# Patient Record
Sex: Male | Born: 1980 | Race: White | Hispanic: No | Marital: Single | Smoking: Current every day smoker
Health system: Southern US, Community
[De-identification: ages and names within clinical notes are randomized; demographics above are authoritative.]

## PROBLEM LIST (undated history)

## (undated) DIAGNOSIS — I10 Essential (primary) hypertension: Secondary | ICD-10-CM

## (undated) DIAGNOSIS — F32A Depression, unspecified: Secondary | ICD-10-CM

## (undated) DIAGNOSIS — M549 Dorsalgia, unspecified: Secondary | ICD-10-CM

## (undated) DIAGNOSIS — F259 Schizoaffective disorder, unspecified: Secondary | ICD-10-CM

## (undated) DIAGNOSIS — F329 Major depressive disorder, single episode, unspecified: Secondary | ICD-10-CM

## (undated) DIAGNOSIS — F419 Anxiety disorder, unspecified: Secondary | ICD-10-CM

## (undated) HISTORY — PX: BACK SURGERY: SHX140

---

## 2001-08-24 ENCOUNTER — Encounter: Payer: Self-pay | Admitting: Family Medicine

## 2001-08-24 ENCOUNTER — Ambulatory Visit (HOSPITAL_COMMUNITY): Admission: RE | Admit: 2001-08-24 | Discharge: 2001-08-24 | Payer: Self-pay | Admitting: Family Medicine

## 2002-11-08 ENCOUNTER — Encounter (HOSPITAL_COMMUNITY): Admission: RE | Admit: 2002-11-08 | Discharge: 2002-12-08 | Payer: Self-pay | Admitting: Preventative Medicine

## 2002-12-07 ENCOUNTER — Ambulatory Visit (HOSPITAL_COMMUNITY): Admission: RE | Admit: 2002-12-07 | Discharge: 2002-12-07 | Payer: Self-pay | Admitting: Family Medicine

## 2002-12-23 ENCOUNTER — Ambulatory Visit (HOSPITAL_COMMUNITY): Admission: RE | Admit: 2002-12-23 | Discharge: 2002-12-23 | Payer: Self-pay | Admitting: Family Medicine

## 2004-06-27 ENCOUNTER — Ambulatory Visit: Payer: Self-pay | Admitting: Orthopedic Surgery

## 2004-11-27 ENCOUNTER — Ambulatory Visit (HOSPITAL_COMMUNITY): Admission: RE | Admit: 2004-11-27 | Discharge: 2004-11-27 | Payer: Self-pay | Admitting: Pulmonary Disease

## 2006-02-14 ENCOUNTER — Encounter: Admission: RE | Admit: 2006-02-14 | Discharge: 2006-02-14 | Payer: Self-pay | Admitting: Orthopaedic Surgery

## 2011-01-14 ENCOUNTER — Other Ambulatory Visit: Payer: Self-pay

## 2011-01-14 ENCOUNTER — Emergency Department (HOSPITAL_COMMUNITY): Payer: BC Managed Care – PPO

## 2011-01-14 ENCOUNTER — Emergency Department (HOSPITAL_COMMUNITY)
Admission: EM | Admit: 2011-01-14 | Discharge: 2011-01-14 | Disposition: A | Discharge status: Home / Self Care | Payer: BC Managed Care – PPO | Attending: Emergency Medicine | Admitting: Emergency Medicine

## 2011-01-14 ENCOUNTER — Encounter: Payer: Self-pay | Admitting: *Deleted

## 2011-01-14 DIAGNOSIS — R0602 Shortness of breath: Secondary | ICD-10-CM | POA: Insufficient documentation

## 2011-01-14 DIAGNOSIS — I10 Essential (primary) hypertension: Secondary | ICD-10-CM | POA: Insufficient documentation

## 2011-01-14 DIAGNOSIS — I498 Other specified cardiac arrhythmias: Secondary | ICD-10-CM | POA: Insufficient documentation

## 2011-01-14 DIAGNOSIS — R079 Chest pain, unspecified: Secondary | ICD-10-CM

## 2011-01-14 DIAGNOSIS — F17200 Nicotine dependence, unspecified, uncomplicated: Secondary | ICD-10-CM | POA: Insufficient documentation

## 2011-01-14 HISTORY — DX: Major depressive disorder, single episode, unspecified: F32.9

## 2011-01-14 HISTORY — DX: Essential (primary) hypertension: I10

## 2011-01-14 HISTORY — DX: Anxiety disorder, unspecified: F41.9

## 2011-01-14 HISTORY — DX: Depression, unspecified: F32.A

## 2011-01-14 LAB — BASIC METABOLIC PANEL
BUN: 9 mg/dL (ref 6–23)
CO2: 28 mEq/L (ref 19–32)
Calcium: 9.6 mg/dL (ref 8.4–10.5)
Chloride: 104 mEq/L (ref 96–112)
Creatinine, Ser: 0.95 mg/dL (ref 0.50–1.35)
GFR calc Af Amer: >90 mL/min (ref >90)
GFR calc non Af Amer: >90 mL/min (ref >90)
Glucose, Bld: 97 mg/dL (ref 70–99)
Potassium: 4.3 mEq/L (ref 3.5–5.1)
Sodium: 141 mEq/L (ref 135–145)

## 2011-01-14 LAB — CBC
HCT: 45.9 % (ref 39.0–52.0)
Hemoglobin: 16.1 g/dL (ref 13.0–17.0)
MCH: 31.6 pg (ref 26.0–34.0)
MCHC: 35.1 g/dL (ref 30.0–36.0)
MCV: 90 fL (ref 78.0–100.0)
Platelets: 156 10*3/uL (ref 150–400)
RBC: 5.1 MIL/uL (ref 4.22–5.81)
RDW: 12.7 % (ref 11.5–15.5)
WBC: 12 10*3/uL — ABNORMAL HIGH (ref 4.0–10.5)

## 2011-01-14 LAB — D-DIMER, QUANTITATIVE: D-Dimer, Quant: 2.08 ug/mL-FEU — ABNORMAL HIGH (ref 0.00–0.48)

## 2011-01-14 LAB — CARDIAC PANEL(CRET KIN+CKTOT+MB+TROPI)
CK, MB: 1.7 ng/mL (ref 0.3–4.0)
Relative Index: 1.3 (ref 0.0–2.5)
Total CK: 127 U/L (ref 7–232)
Troponin I: <0.3 ng/mL (ref <0.30)

## 2011-01-14 LAB — LIPASE, BLOOD: Lipase: 32 U/L (ref 11–59)

## 2011-01-14 MED ORDER — ASPIRIN 81 MG PO CHEW
324.0000 mg | CHEWABLE_TABLET | Freq: Once | ORAL | Status: AC
  Administered 2011-01-14: 324 mg via ORAL
  Filled 2011-01-14: qty 4

## 2011-01-14 MED ORDER — IOHEXOL 350 MG/ML SOLN
100.0000 mL | Freq: Once | INTRAVENOUS | Status: AC | PRN
Start: 2011-01-14 — End: 2011-01-14
  Administered 2011-01-14: 100 mL via INTRAVENOUS

## 2011-01-14 NOTE — ED Notes (Signed)
Central cp that started appox 0500 this morning.  States woke pt up from sleep.  C/o sob and dizziness.

## 2011-01-14 NOTE — ED Notes (Signed)
PT reports was driving [place] and had sudden onset of chest pain/pressure in center of chest.  Reports lasted approx .  This morning pt says woke up around 0500 with severe pain/pressure in center of chest.   Pt denies pain or pressure at this time.  Denies nausea but says was a little dizzy this am.  Pt's bp elevated.  PT supposed to be on bp medication but hasn't taken it in months.  Pt says he just forgets to take it.

## 2011-01-14 NOTE — ED Notes (Signed)
nad noted at this time.

## 2011-01-15 NOTE — ED Provider Notes (Addendum)
History     CSN: [id number] Arrival date & time: 01/14/2011  8:48 AM   First MD Initiated Contact with Patient 01/14/11 0915      Chief Complaint  Patient presents with  . Chest Pain    (Consider location/radiation/quality/duration/timing/severity/associated sxs/prior treatment) Patient is a 30 y.o. male presenting with chest pain. The history is provided by the patient.  Chest Pain The chest pain began 3 - 5 hours ago. Chest pain occurs constantly. The chest pain is unchanged. The severity of the pain is moderate. The quality of the pain is described as dull. The pain does not radiate. Chest pain is worsened by deep breathing. Primary symptoms include dizziness. Pertinent negatives for primary symptoms include no shortness of breath, no cough, no wheezing, no palpitations and no abdominal pain.  Dizziness does not occur with weakness or diaphoresis.  Pertinent negatives for associated symptoms include no claudication, no diaphoresis, no lower extremity edema, no near-syncope, no numbness, no orthopnea and no weakness. He tried nothing for the symptoms. Risk factors include male gender and smoking/tobacco exposure (hypertension).  His past medical history is significant for hypertension.  Pertinent negatives for past medical history include no seizures.  His family medical history is significant for hypertension in family. Procedure history comments: none.     Past Medical History  Diagnosis Date  . Hypertension   . Depression   . Anxiety     History reviewed. No pertinent past surgical history.  No family history on file.  History  Substance Use Topics  . Smoking status: Current Everyday Smoker    Types: Cigarettes  . Smokeless tobacco: Not on file  . Alcohol Use: Yes     daily      Review of Systems  Constitutional: Negative for diaphoresis and activity change.       All ROS Neg except as noted in HPI  HENT: Negative for nosebleeds and neck pain.   Eyes: Negative  for photophobia and discharge.  Respiratory: Negative for cough, shortness of breath and wheezing.   Cardiovascular: Positive for chest pain. Negative for palpitations, orthopnea, claudication and near-syncope.  Gastrointestinal: Negative for abdominal pain and blood in stool.  Genitourinary: Negative for dysuria, frequency and hematuria.  Musculoskeletal: Negative for back pain and arthralgias.  Skin: Negative.   Neurological: Positive for dizziness. Negative for seizures, speech difficulty, weakness and numbness.  Psychiatric/Behavioral: Negative for hallucinations and confusion. The patient is nervous/anxious.        Depression    Allergies  Review of patient's allergies indicates no known allergies.  Home Medications   Current Outpatient Rx  Name Route Sig Dispense Refill  . LISINOPRIL-HYDROCHLOROTHIAZIDE 20-12.5 MG PO TABS Oral Take 1 tablet by mouth daily.        BP 150/103  Pulse 89  Temp(Src) 98 F (36.7 C) (Oral)  Resp 18  Ht 5\' 11"  (1.803 m)  Wt 200 lb (90.719 kg)  BMI 27.89 kg/m2  SpO2 98%  Physical Exam  Nursing note and vitals reviewed. Constitutional: He is oriented to person, place, and time. He appears well-developed and well-nourished.  Non-toxic appearance.  HENT:  Head: Normocephalic.  Right Ear: Tympanic membrane and external ear normal.  Left Ear: Tympanic membrane and external ear normal.  Eyes: EOM and lids are normal. Pupils are equal, round, and reactive to light.  Neck: Normal range of motion. Neck supple. No JVD present. Carotid bruit is not present.  Cardiovascular: Normal rate, regular rhythm, normal heart sounds, intact distal pulses and  normal pulses.   Pulmonary/Chest: Breath sounds normal. No respiratory distress. He has no wheezes. He has no rales. He exhibits no tenderness.  Abdominal: Soft. Bowel sounds are normal. He exhibits no mass. There is no tenderness. There is no guarding.  Musculoskeletal: Normal range of motion. He exhibits no  edema and no tenderness.       Neg Homan's Sign  Lymphadenopathy:       Head (right side): No submandibular adenopathy present.       Head (left side): No submandibular adenopathy present.    He has no cervical adenopathy.  Neurological: He is alert and oriented to person, place, and time. He has normal strength. No cranial nerve deficit or sensory deficit.  Skin: Skin is warm and dry. No rash noted. He is not diaphoretic.  Psychiatric: He has a normal mood and affect. His speech is normal.    ED Course  Procedures (including critical care time) EKG: EKG at 8:50am. Rhythm: Normal sinus with sinus arrhythmia. Left Atrial Enlargement. NO STEMI or Life threatening arrhythmia. Labs Reviewed  CBC - Abnormal; Notable for the following:    WBC 12.0 (*)    All other components within normal limits  D-DIMER, QUANTITATIVE - Abnormal; Notable for the following:    D-Dimer, Quant 2.08 (*)    All other components within normal limits  CARDIAC PANEL(CRET KIN+CKTOT+MB+TROPI)  BASIC METABOLIC PANEL  LIPASE, BLOOD  LAB REPORT - SCANNED   Ct Angio Chest W/cm &/or Wo Cm  01/14/2011  *RADIOLOGY REPORT*  Clinical Data:  Chest pain, elevated D-dimer  CT ANGIOGRAPHY CHEST WITH CONTRAST  Technique:  Multidetector CT imaging of the chest was performed using the standard protocol during bolus administration of intravenous contrast.  Multiplanar CT image reconstructions including MIPs were obtained to evaluate the vascular anatomy.  Contrast: OMNIPAQUE IOHEXOL 350 MG/ML IV SOLN  Comparison:  Chest x-rays same day  Findings:  Images of the thoracic inlet are unremarkable.  Central airways are patent.  No mediastinal hematoma or adenopathy.  The study is of excellent technical quality.  There is no pulmonary embolus.  Images of the lung parenchyma shows no acute infiltrate or pleural effusion.  No pulmonary edema.  There is a calcified granuloma within the liver anteriorly measures 5.5 mm.  No adrenal gland mass  is noted in visualized upper abdomen.  Sagittal images of the spine shows no destructive bony lesions. Sagittal view of the sternum is unremarkable.  Review of the MIP images confirms the above findings.  IMPRESSION:  1.  No pulmonary embolus is noted. 2.  No acute infiltrate or pulmonary edema. 3.  No adenopathy.  Original Report Authenticated By: Natasha Mead, M.D.   Dg Chest Portable 1 View  01/14/2011  *RADIOLOGY REPORT*  Clinical Data: Chest pain, shortness of breath  PORTABLE CHEST - 1 VIEW  Comparison: None.  Findings: Cardiomediastinal silhouette is unremarkable.  No acute infiltrate or pleural effusion.  No pulmonary edema.  Bony thorax is unremarkable.  IMPRESSION: No active disease.  Original Report Authenticated By: Natasha Mead, M.D.     1. Chest pain, unspecified       MDM  I have reviewed nursing notes, vital signs, and all appropriate lab and imaging results for this patient. Chest xray, CT angio, cardiac markers, and electrolytes all wnl. Pt monitored inED for several hours, no arhythmias noted on monitor. Safe for pt to be discharged home with out patient recheck.        Kathie Dike,  PA 01/15/11 [id number]  Kathie Dike, PA 03/01/11 1654  Kathie Dike, PA 03/24/11 [phone]

## 2011-01-17 NOTE — ED Provider Notes (Signed)
Medical screening examination/treatment/procedure(s) were performed by non-physician practitioner and as supervising physician I was immediately available for consultation/collaboration.  Nicoletta Dress. Colon Branch, MD 01/17/11 1040

## 2011-03-05 NOTE — ED Provider Notes (Signed)
Medical screening examination/treatment/procedure(s) were performed by non-physician practitioner and as supervising physician I was immediately available for consultation/collaboration.  Nicoletta Dress. Colon Branch, MD 03/05/11 2242

## 2011-03-24 NOTE — ED Provider Notes (Signed)
Medical screening examination/treatment/procedure(s) were performed by non-physician practitioner and as supervising physician I was immediately available for consultation/collaboration.  Nicoletta Dress. Colon Branch, MD 03/24/11 2322

## 2012-09-01 ENCOUNTER — Other Ambulatory Visit (HOSPITAL_COMMUNITY): Payer: Self-pay | Admitting: Family Medicine

## 2012-09-01 ENCOUNTER — Ambulatory Visit (HOSPITAL_COMMUNITY)
Admission: RE | Admit: 2012-09-01 | Discharge: 2012-09-01 | Disposition: A | Discharge status: Home / Self Care | Payer: BC Managed Care – PPO | Source: Ambulatory Visit | Attending: Family Medicine | Admitting: Family Medicine

## 2012-09-01 DIAGNOSIS — M545 Low back pain, unspecified: Secondary | ICD-10-CM | POA: Insufficient documentation

## 2012-09-01 DIAGNOSIS — Q762 Congenital spondylolisthesis: Secondary | ICD-10-CM | POA: Insufficient documentation

## 2012-09-01 DIAGNOSIS — M5430 Sciatica, unspecified side: Secondary | ICD-10-CM

## 2013-11-04 ENCOUNTER — Emergency Department (HOSPITAL_COMMUNITY): Payer: BC Managed Care – PPO

## 2013-11-04 ENCOUNTER — Emergency Department (HOSPITAL_COMMUNITY)
Admission: EM | Admit: 2013-11-04 | Discharge: 2013-11-04 | Disposition: A | Discharge status: Home / Self Care | Payer: BC Managed Care – PPO | Attending: Emergency Medicine | Admitting: Emergency Medicine

## 2013-11-04 ENCOUNTER — Encounter (HOSPITAL_COMMUNITY): Payer: Self-pay | Admitting: Emergency Medicine

## 2013-11-04 DIAGNOSIS — Z8659 Personal history of other mental and behavioral disorders: Secondary | ICD-10-CM | POA: Diagnosis not present

## 2013-11-04 DIAGNOSIS — F17200 Nicotine dependence, unspecified, uncomplicated: Secondary | ICD-10-CM | POA: Diagnosis not present

## 2013-11-04 DIAGNOSIS — I10 Essential (primary) hypertension: Secondary | ICD-10-CM | POA: Diagnosis not present

## 2013-11-04 DIAGNOSIS — S46011A Strain of muscle(s) and tendon(s) of the rotator cuff of right shoulder, initial encounter: Secondary | ICD-10-CM

## 2013-11-04 DIAGNOSIS — S4980XA Other specified injuries of shoulder and upper arm, unspecified arm, initial encounter: Secondary | ICD-10-CM | POA: Insufficient documentation

## 2013-11-04 DIAGNOSIS — Y929 Unspecified place or not applicable: Secondary | ICD-10-CM | POA: Diagnosis not present

## 2013-11-04 DIAGNOSIS — S43429A Sprain of unspecified rotator cuff capsule, initial encounter: Secondary | ICD-10-CM | POA: Insufficient documentation

## 2013-11-04 DIAGNOSIS — Y9389 Activity, other specified: Secondary | ICD-10-CM | POA: Insufficient documentation

## 2013-11-04 DIAGNOSIS — X58XXXA Exposure to other specified factors, initial encounter: Secondary | ICD-10-CM | POA: Diagnosis not present

## 2013-11-04 DIAGNOSIS — Z79899 Other long term (current) drug therapy: Secondary | ICD-10-CM | POA: Insufficient documentation

## 2013-11-04 DIAGNOSIS — S46909A Unspecified injury of unspecified muscle, fascia and tendon at shoulder and upper arm level, unspecified arm, initial encounter: Secondary | ICD-10-CM | POA: Diagnosis present

## 2013-11-04 MED ORDER — HYDROCODONE-ACETAMINOPHEN 5-325 MG PO TABS: 1.0000 | ORAL_TABLET | ORAL | Status: DC | PRN

## 2013-11-04 MED ORDER — IBUPROFEN 800 MG PO TABS: 800.0000 mg | ORAL_TABLET | Freq: Three times a day (TID) | ORAL | Status: DC

## 2013-11-04 NOTE — ED Provider Notes (Signed)
CSN: [id number]     Arrival date & time 11/04/13  3244 History   First MD Initiated Contact with Patient 11/04/13 [phone]    This chart was scribed for Alexander Pope, * by Marica Otter, ED Scribe. This patient was seen in room [facility]19/[facility] and the patient's care was started at 8:44 AM.  Chief Complaint  Patient presents with  . Shoulder Pain   The history is provided by the patient. No language interpreter was used.   HPI Comments: PCP: No PCP Per Patient [name] is a 33 y.o. male, with medical Hx noted below, who presents to the Emergency Department complaining of worsening right shoulder pain onset yesterday after pt sustained an injury while helping cut down branches off a tree. Pt reports that a tree limb began to fall off the tree when he grabbed it by his right hand causing his right shoulder to be pulled down. Pt describes the pain as a constant stabbing sensation and rates it a 10 out of 10. Pt specifies that the pain is worse today than yesterday; noting that yesterday his ROM was fairly close to baseline, however, today his ROM is very limited.  Past Medical History  Diagnosis Date  . Hypertension   . Depression   . Anxiety    No past surgical history on file. No family history on file. History  Substance Use Topics  . Smoking status: Current Every Day Smoker    Types: Cigarettes  . Smokeless tobacco: Not on file  . Alcohol Use: Yes     Comment: daily    Review of Systems  Musculoskeletal:       Right shoulder pain   Psychiatric/Behavioral: Negative for confusion.  All other systems reviewed and are negative.     Allergies  Review of patient's allergies indicates no known allergies.  Home Medications   Prior to Admission medications   Medication Sig Start Date End Date Taking? Authorizing Provider  lisinopril-hydrochlorothiazide (PRINZIDE,ZESTORETIC) 20-12.5 MG per tablet Take 1 tablet by mouth daily.      Historical Provider, MD   Triage Vitals:  BP 161/110  Pulse 95  Temp(Src) 98.2 F (36.8 C) (Oral)  Resp 16  SpO2 99% Physical Exam  Constitutional: He is oriented to person, place, and time. He appears well-developed and well-nourished. No distress.  HENT:  Head: Normocephalic and atraumatic.  Right Ear: Hearing normal.  Left Ear: Hearing normal.  Nose: Nose normal.  Mouth/Throat: Oropharynx is clear and moist and mucous membranes are normal.  Eyes: Conjunctivae and EOM are normal. Pupils are equal, round, and reactive to light.  Neck: Normal range of motion. Neck supple.  Cardiovascular: Regular rhythm, S1 normal and S2 normal.  Exam reveals no gallop and no friction rub.   No murmur heard. Pulmonary/Chest: Effort normal and breath sounds normal. No respiratory distress. He exhibits no tenderness.  Abdominal: Soft. Normal appearance and bowel sounds are normal. There is no hepatosplenomegaly. There is no tenderness. There is no rebound, no guarding, no tenderness at McBurney's point and negative Murphy's sign. No hernia.  Musculoskeletal: Normal range of motion.  Tenderness anterior and posterior right shoulder. No deformities.   Neurological: He is alert and oriented to person, place, and time. He has normal strength. No cranial nerve deficit or sensory deficit. Coordination normal. GCS eye subscore is 4. GCS verbal subscore is 5. GCS motor subscore is 6.  Skin: Skin is warm, dry and intact. No rash noted. No cyanosis.  Psychiatric: He has  a normal mood and affect. His speech is normal and behavior is normal. Thought content normal.    ED Course  Procedures (including critical care time) DIAGNOSTIC STUDIES: Oxygen Saturation is 99% on RA, nl by my interpretation.    COORDINATION OF CARE: 8:48 AM-Discussed treatment plan which includes imaging with pt at bedside and pt agreed to plan.   Labs Review Labs Reviewed - No data to display  Imaging Review No results found.   EKG Interpretation None      MDM   Final  diagnoses:  None   shoulder injury, possible rotator cuff injury   Presents to ER for evaluation of right shoulder pain. Patient injured his right shoulder yesterday cutting down a tree. He was holding a rope that was tied to a limb which fell, jerking his arm. Patient complaining of severe pain, worsens with movement. Examination did not reveal any deformity. X-ray does not show any dislocation or fracture. There would be concern for possible muscle injury, including rotator cuff injury. Patient will be treated with analgesia, slight discomfort. I did discuss with him the need for range of motion exercises. He will be referred to orthopedics.  I personally performed the services described in this documentation, which was scribed in my presence. The recorded information has been reviewed and is accurate.      Alexander Crease, MD 11/04/13 [phone]

## 2013-11-04 NOTE — Discharge Instructions (Signed)
Rotator Cuff Injury °Rotator cuff injury is any type of injury to the set of muscles and tendons that make up the stabilizing unit of your shoulder. This unit holds the ball of your upper arm bone (humerus) in the socket of your shoulder blade (scapula).  °CAUSES °Injuries to your rotator cuff most commonly come from sports or activities that cause your arm to be moved repeatedly over your head. Examples of this include throwing, weight lifting, swimming, or racquet sports. Long lasting (chronic) irritation of your rotator cuff can cause soreness and swelling (inflammation), bursitis, and eventual damage to your tendons, such as a tear (rupture). °SIGNS AND SYMPTOMS °Acute rotator cuff tear: °· Sudden tearing sensation followed by severe pain shooting from your upper shoulder down your arm toward your elbow. °· Decreased range of motion of your shoulder because of pain and muscle spasm. °· Severe pain. °· Inability to raise your arm out to the side because of pain and loss of muscle power (large tears). °Chronic rotator cuff tear: °· Pain that usually is worse at night and may interfere with sleep. °· Gradual weakness and decreased shoulder motion as the pain worsens. °· Decreased range of motion. °Rotator cuff tendinitis:  °· Deep ache in your shoulder and the outside upper arm over your shoulder. °· Pain that comes on gradually and becomes worse when lifting your arm to the side or turning it inward. °DIAGNOSIS °Rotator cuff injury is diagnosed through a medical history, physical exam, and imaging exam. The medical history helps determine the type of rotator cuff injury. Your health care provider will look at your injured shoulder, feel the injured area, and ask you to move your shoulder in different positions. X-ray exams typically are done to rule out other causes of shoulder pain, such as fractures. MRI is the exam of choice for the most severe shoulder injuries because the images show muscles and tendons.    °TREATMENT  °Chronic tear: °· Medicine for pain, such as acetaminophen or ibuprofen. °· Physical therapy and range-of-motion exercises may be helpful in maintaining shoulder function and strength. °· Steroid injections into your shoulder joint. °· Surgical repair of the rotator cuff if the injury does not heal with noninvasive treatment. °Acute tear: °· Anti-inflammatory medicines such as ibuprofen and naproxen to help reduce pain and swelling. °· A sling to help support your arm and rest your rotator cuff muscles. Long-term use of a sling is not advised. It may cause significant stiffening of the shoulder joint. °· Surgery may be considered within a few weeks, especially in younger, active people, to return the shoulder to full function. °· Indications for surgical treatment include the following: °¨ Age younger than 60 years. °¨ Rotator cuff tears that are complete. °¨ Physical therapy, rest, and anti-inflammatory medicines have been used for 6-8 weeks, with no improvement. °¨ Employment or sporting activity that requires constant shoulder use. °Tendinitis: °· Anti-inflammatory medicines such as ibuprofen and naproxen to help reduce pain and swelling. °· A sling to help support your arm and rest your rotator cuff muscles. Long-term use of a sling is not advised. It may cause significant stiffening of the shoulder joint. °· Severe tendinitis may require: °¨ Steroid injections into your shoulder joint. °¨ Physical therapy. °¨ Surgery. °HOME CARE INSTRUCTIONS  °· Apply ice to your injury: °¨ Put ice in a plastic bag. °¨ Place a towel between your skin and the bag. °¨ Leave the ice on for 20 minutes, 2-3 times a day. °· If you   have a shoulder immobilizer (sling and straps), wear it until told otherwise by your health care provider. °· You may want to sleep on several pillows or in a recliner at night to lessen swelling and pain. °· Only take over-the-counter or prescription medicines for pain, discomfort, or fever as  directed by your health care provider. °· Do simple hand squeezing exercises with a soft rubber ball to decrease hand swelling. °SEEK MEDICAL CARE IF:  °· Your shoulder pain increases, or new pain or numbness develops in your arm, hand, or fingers. °· Your hand or fingers are colder than your other hand. °SEEK IMMEDIATE MEDICAL CARE IF:  °· Your arm, hand, or fingers are numb or tingling. °· Your arm, hand, or fingers are increasingly swollen and painful, or they turn white or blue. °MAKE SURE YOU: °· Understand these instructions. °· Will watch your condition. °· Will get help right away if you are not doing well or get worse. °Document Released: 01/25/2000 Document Revised: 02/01/2013 Document Reviewed: 09/08/2012 °[employer]® Patient Information ©2015 [employer]. This information is not intended to replace advice given to you by your health care provider. Make sure you discuss any questions you have with your health care provider. °Sling Use After Injury or Surgery °You have been put in a sling today because of an injury or following surgery. If you have a tendon or bone injury it may take up to 6 weeks to heal. Use the sling as directed until your caregiver says it is no longer needed. The sling protects and keeps you from using the injured part. Hanging your arm in a sling will give rest and support to the injured part. This also helps with comfort and healing. Slings are used for injuries made worse or more painful by movement. Examples include: °· Broken arms. °· Broken collarbones. °· Shoulder injuries. °· Following surgery. °The sling should fit comfortably, with your elbow at one end of the sling and your hand at the other end. Your elbow is bent 90 degrees lying across your waist and rests in the sling with your thumb pointing up. Make sure that the hand of the injured arm does not droop down. That could stretch some nerves in the wrist. Your hand should be slightly higher than your elbow. You may also  pad the sling behind your neck with some cloth or foam rubber.  °A swathe may also be used if it is necessary to keep you from lifting your injured arm. A swathe is a wrap or ace bandage that goes around your chest over your injured arm.  °To take the weight off your neck, some slings have a strap that goes around your neck and down your back. One strap is connected to the closed elbow side of the sling with the other end of the strap attached to the wrist side. With a sling like this, your injured shoulder, arm, wrist, or hand is in the sling, the weight is more on your shoulder and back. This is different from the illustration where the sling is supported only by the neck.  °In an emergency, a sling can be as simple as a belt or towel tied around your neck to hold your forearm.  °HOME CARE INSTRUCTIONS  °· Do not use your shoulder until instructed to by your caregiver. °· If you have been prescribed physical therapy, keep appointments as directed. °· For the first couple days following your injury and during times when you are sore, you may use   ice on the injured area for 15-20 minutes 03-04 times per day while awake. Put the ice in a plastic bag and place a towel between the bag of ice and your skin. This will help keep the swelling down. °· If there is numbness in the fifth finger and ring fingers you may need to pad the elbow to relieve pressure on the ulnar nerve (the crazy bone). °· Keep your arm on your chest when lying down. °· If a plaster splint was applied, wear the splint until you are seen for a follow-up examination. Rest it on nothing harder than a pillow the first 24 hours. Do not get it wet. You may take it off to take a shower or bath unless instructed otherwise by your caregiver. °· You may have been given an elastic bandage to use with the plaster splint or alone. The splint is too tight if you have numbness, tingling, or if your hand becomes cold and blue. Adjust or reapply the bandage to make  it comfortable. °· Only take over-the-counter or prescription medicines for pain, discomfort, or fever as directed by your caregiver. °· If range of motion exercises are permitted by your caregiver, do not go over the limits suggested. If you have increased pain from doing gentle exercises, stop the exercises until you see your caregiver again. °· The length of time needed for healing depends on what your injury or surgery was. °SEEK IMMEDIATE MEDICAL CARE IF:  °· You have an increase in bruising, swelling or pain in the area of your injury or surgery. °· You notice a blue color of or coldness in your fingers. °· Pain relief is not obtained with medications or any of your problems are getting worse. °Document Released: 09/11/2003 Document Revised: 01/14/2012 Document Reviewed: 12/12/2006 °[employer]® Patient Information ©2015 [employer]. This information is not intended to replace advice given to you by your health care provider. Make sure you discuss any questions you have with your health care provider. ° °

## 2013-11-04 NOTE — ED Notes (Signed)
Rt shoulder pain after a tree limbed snatched from his hand yesterday pulling his shoulder. Limited ROM per pt.

## 2013-11-07 ENCOUNTER — Telehealth: Payer: Self-pay | Admitting: Orthopedic Surgery

## 2013-11-07 NOTE — Telephone Encounter (Signed)
Patient was seen in the ER on Friday 11/04/13 for Right Shoulder Pain, X-ray states normal x-ray, I offered patient first available and to put patient on the wait list and he refused appointment on 11/29/13 and then he hung up the phone. Minutes later is mother called back and I offered the same exact appointment and she refused.

## 2014-01-04 ENCOUNTER — Other Ambulatory Visit (HOSPITAL_COMMUNITY): Payer: Self-pay | Admitting: Family Medicine

## 2014-01-04 DIAGNOSIS — M455 Ankylosing spondylitis of thoracolumbar region: Secondary | ICD-10-CM

## 2014-01-12 ENCOUNTER — Ambulatory Visit (HOSPITAL_COMMUNITY)
Admission: RE | Admit: 2014-01-12 | Discharge: 2014-01-12 | Disposition: A | Discharge status: Home / Self Care | Payer: BC Managed Care – PPO | Source: Ambulatory Visit | Attending: Family Medicine | Admitting: Family Medicine

## 2014-01-12 DIAGNOSIS — M455 Ankylosing spondylitis of thoracolumbar region: Secondary | ICD-10-CM

## 2014-01-12 DIAGNOSIS — R531 Weakness: Secondary | ICD-10-CM | POA: Diagnosis not present

## 2014-01-12 DIAGNOSIS — M545 Low back pain: Secondary | ICD-10-CM | POA: Insufficient documentation

## 2014-01-12 DIAGNOSIS — M79605 Pain in left leg: Secondary | ICD-10-CM | POA: Insufficient documentation

## 2014-01-18 ENCOUNTER — Other Ambulatory Visit: Payer: Self-pay | Admitting: Family Medicine

## 2014-01-18 DIAGNOSIS — M5440 Lumbago with sciatica, unspecified side: Secondary | ICD-10-CM

## 2014-01-25 ENCOUNTER — Other Ambulatory Visit: Payer: Self-pay | Admitting: Family Medicine

## 2014-01-25 ENCOUNTER — Ambulatory Visit
Admission: RE | Admit: 2014-01-25 | Discharge: 2014-01-25 | Disposition: A | Discharge status: Home / Self Care | Payer: BC Managed Care – PPO | Source: Ambulatory Visit | Attending: Family Medicine | Admitting: Family Medicine

## 2014-01-25 DIAGNOSIS — M5440 Lumbago with sciatica, unspecified side: Secondary | ICD-10-CM

## 2014-01-25 MED ORDER — METHYLPREDNISOLONE ACETATE 40 MG/ML INJ SUSP (RADIOLOG
120.0000 mg | Freq: Once | INTRAMUSCULAR | Status: AC
  Administered 2014-01-25: 120 mg via EPIDURAL

## 2014-01-25 MED ORDER — IOHEXOL 180 MG/ML  SOLN
1.0000 mL | Freq: Once | INTRAMUSCULAR | Status: AC | PRN
  Administered 2014-01-25: 1 mL via EPIDURAL

## 2014-01-25 NOTE — Discharge Instructions (Signed)

## 2014-10-08 ENCOUNTER — Emergency Department (HOSPITAL_COMMUNITY)
Admission: EM | Admit: 2014-10-08 | Discharge: 2014-10-08 | Disposition: A | Discharge status: Home / Self Care | Payer: BLUE CROSS/BLUE SHIELD | Attending: Physician Assistant | Admitting: Physician Assistant

## 2014-10-08 DIAGNOSIS — Z79899 Other long term (current) drug therapy: Secondary | ICD-10-CM | POA: Insufficient documentation

## 2014-10-08 DIAGNOSIS — G8929 Other chronic pain: Secondary | ICD-10-CM | POA: Insufficient documentation

## 2014-10-08 DIAGNOSIS — I10 Essential (primary) hypertension: Secondary | ICD-10-CM | POA: Insufficient documentation

## 2014-10-08 DIAGNOSIS — F1010 Alcohol abuse, uncomplicated: Secondary | ICD-10-CM | POA: Insufficient documentation

## 2014-10-08 DIAGNOSIS — Z720 Tobacco use: Secondary | ICD-10-CM | POA: Insufficient documentation

## 2014-10-08 DIAGNOSIS — Z791 Long term (current) use of non-steroidal anti-inflammatories (NSAID): Secondary | ICD-10-CM | POA: Insufficient documentation

## 2014-10-08 MED ORDER — SODIUM CHLORIDE 0.9 % IV BOLUS (SEPSIS)
1000.0000 mL | Freq: Once | INTRAVENOUS | Status: DC
Start: 2014-10-08 — End: 2014-10-09
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
## 2014-10-08 NOTE — ED Notes (Addendum)
Went to room to discharge pt, pt became aggitated stating he wanted ETOH withdrawal treatment at this hospital and did not want to got home. Advised pt of outpatient treatment options and that detox was not treatment option at this facility. He had been medically cleared by the EDP and his discharge was ordered. Pt stated he was not going home and was staying until he was "admitted to have a place to stay", advised pt again or policy and procedure as well as his discharge orders along with outpatient treatment options for discharge. Pt stated "what do I have to do to be committed because my dad said I needed to be commitied" I advised him that it had nothing to do with his dad and was about him. I asked pt multiple times why he thought he needed to be committed. He continued to repeat "cause my dad said". Pt left room with friend who stated she "knew how to get him in" and refused to sign for discharge.

## 2014-10-08 NOTE — ED Notes (Addendum)
Chronic back pain, and exacerbated today while detailing a car. Pt states he had "4 beers" today and wants help with detox. History  Of detox, out in June.

## 2014-10-08 NOTE — Discharge Instructions (Signed)
Alcohol and Nutrition °Nutrition serves two purposes. It provides energy. It also maintains body structure and function. Food supplies energy. It also provides the building blocks needed to replace worn or damaged cells. Alcoholics often eat poorly. This limits their supply of essential nutrients. This affects energy supply and structure maintenance. Alcohol also affects the body's nutrients in: °· Digestion. °· Storage. °· Using and getting rid of waste products. °IMPAIRMENT OF NUTRIENT DIGESTION AND UTILIZATION  °· Once ingested, food must be broken down into small components (digested). Then it is available for energy. It helps maintain body structure and function. Digestion begins in the mouth. It continues in the stomach and intestines, with help from the pancreas. The nutrients from digested food are absorbed from the intestines into the blood. Then they are carried to the liver. The liver prepares nutrients for: °· Immediate use. °· Storage and future use. °· Alcohol inhibits the breakdown of nutrients into usable molecules. °· It decreases secretion of digestive enzymes from the pancreas. °· Alcohol impairs nutrient absorption by damaging the cells lining the stomach and intestines. °· It also interferes with moving some nutrients into the blood. °· In addition, nutritional deficiencies themselves may lead to further absorption problems. °· For example, folate deficiency changes the cells that line the small intestine. This impairs how water is absorbed. It also affects absorbed nutrients. These include glucose, sodium, and additional folate. °· Even if nutrients are digested and absorbed, alcohol can prevent them from being fully used. It changes their transport, storage, and excretion. Impaired utilization of nutrients by alcoholics is indicated by: °· Decreased liver stores of vitamins, such as vitamin A. °· Increased excretion of nutrients such as fat. °ALCOHOL AND ENERGY SUPPLY  °· Three basic  nutritional components found in food are: °· Carbohydrates. °· Proteins. °· Fats. °· These are used as energy. Some alcoholics take in as much as 50% of their total daily calories from alcohol. They often neglect important foods. °· Even when enough food is eaten, alcohol can impair the ways the body controls blood sugar (glucose) levels. It may either increase or decrease blood sugar. °· In non-diabetic alcoholics, increased blood sugar (hyperglycemia) is caused by poor insulin secretion. It is usually temporary. °· Decreased blood sugar (hypoglycemia) can cause serious injury even if this condition is short-lived. Low blood sugar can happen when a fasting or malnourished person drinks alcohol. When there is no food to supply energy, stored sugar is used up. The products of alcohol inhibit forming glucose from other compounds such as amino acids. As a result, alcohol causes the brain and other body tissue to lack glucose. It is needed for energy and function. °· Alcohol is an energy source. But how the body processes and uses the energy from alcohol is complex. Also, when alcohol is substituted for carbohydrates, subjects tend to lose weight. This indicates that they get less energy from alcohol than from food. °ALCOHOL - MAINTAINING CELL STRUCTURE AND FUNCTION  °Structure °Cells are made mostly of protein. So an adequate protein diet is important for maintaining cell structure. This is especially true if cells are being damaged. Research indicates that alcohol affects protein nutrition by causing impaired: °· Digestion of proteins to amino acids. °· Processing of amino acids by the small intestine and liver. °· Synthesis of proteins from amino acids. °· Protein secretion by the liver. °Function °Nutrients are essential for the body to function well. They provide the tools that the body needs to work well:  °·   Proteins. °· Vitamins. °· Minerals. °Alcohol can disrupt body function. It may cause nutrient  deficiencies. And it may interfere with the way nutrients are processed. °Vitamins °· Vitamins are essential to maintain growth and normal metabolism. They regulate many of the body`s processes. Chronic heavy drinking causes deficiencies in many vitamins. This is caused by eating less. And, in some cases, vitamins may be poorly absorbed. For example, alcohol inhibits fat absorption. It impairs how the vitamins A, E, and D are normally absorbed along with dietary fats. Not enough vitamin A may cause night blindness. Not enough vitamin D may cause softening of the bones. °· Some alcoholics lack vitamins A, C, D, E, K, and the B vitamins. These are all involved in wound healing and cell maintenance. In particular, because vitamin K is necessary for blood clotting, lacking that vitamin can cause delayed clotting. The result is excess bleeding. Lacking other vitamins involved in brain function may cause severe neurological damage. °Minerals °Deficiencies of minerals such as calcium, magnesium, iron, and zinc are common in alcoholics. The alcohol itself does not seem to affect how these minerals are absorbed. Rather, they seem to occur secondary to other alcohol-related problems, such as: °· Less calcium absorbed. °· Not enough magnesium. °· More urinary excretion. °· Vomiting. °· Diarrhea. °· Not enough iron due to gastrointestinal bleeding. °· Not enough zinc or losses related to other nutrient deficiencies. °· Mineral deficiencies can cause a variety of medical consequences. These range from calcium-related bone disease to zinc-related night blindness and skin lesions. °ALCOHOL, MALNUTRITION, AND MEDICAL COMPLICATIONS  °Liver Disease  °· Alcoholic liver damage is caused primarily by alcohol itself. But poor nutrition may increase the risk of alcohol-related liver damage. For example, nutrients normally found in the liver are known to be affected by drinking alcohol. These include carotenoids, which are the major  sources of vitamin A, and vitamin E compounds. Decreases in such nutrients may play some role in alcohol-related liver damage. °Pancreatitis °· Research suggests that malnutrition may increase the risk of developing alcoholic pancreatitis. Research suggests that a diet lacking in protein may increase alcohol's damaging effect on the pancreas. °Brain °· Nutritional deficiencies may have severe effects on brain function. These may be permanent. Specifically, thiamine deficiencies are often seen in alcoholics. They can cause severe neurological problems. These include: °¨ Impaired movement. °¨ Memory loss seen in Wernicke-Korsakoff syndrome. °Pregnancy °· Alcohol has toxic effects on fetal development. It causes alcohol-related birth defects. They include fetal alcohol syndrome. Alcohol itself is toxic to the fetus. Also, the nutritional deficiency can affect how the fetus develops. That may compound the risk of developmental damage. °· Nutritional needs during pregnancy are 10% to 30% greater than normal. Food intake can increase by as much as 140% to cover the needs of both mother and fetus. An alcoholic mother`s nutritional problems may adversely affect the nutrition of the fetus. And alcohol itself can also restrict nutrition flow to the fetus. °NUTRITIONAL STATUS OF ALCOHOLICS  °Techniques for assessing nutritional status include: °· Taking body measurements to estimate fat reserves. They include: °¨ Weight. °¨ Height. °¨ Mass. °¨ Skin fold thickness. °· Performing blood analysis to provide measurements of circulating: °¨ Proteins. °¨ Vitamins. °¨ Minerals. °· These techniques tend to be imprecise. For many nutrients, there is no clear "cut-off" point that would allow an accurate definition of deficiency. So assessing the nutritional status of alcoholics is limited by these techniques. Dietary status may provide information about the risk of developing nutritional problems.   Dietary status is assessed by:  Taking  patients' dietary histories.  Evaluating the amount and types of food they are eating.  It is difficult to determine what exact amount of alcohol begins to have damaging effects on nutrition. In general, moderate drinkers have 2 drinks or less per day. They seem to be at little risk for nutritional problems. Various medical disorders begin to appear at greater levels.  Research indicates that the majority of even the heaviest drinkers have few obvious nutritional deficiencies. Many alcoholics who are hospitalized for medical complications of their disease do have severe malnutrition. Alcoholics tend to eat poorly. Often they eat less than the amounts of food necessary to provide enough:  Carbohydrates.  Protein.  Fat.  Vitamins A and C.  B vitamins.  Minerals like calcium and iron. Of major concern is alcohol's effect on digesting food and use of nutrients. It may shift a mildly malnourished person toward severe malnutrition. Document Released: 11/21/2004 Document Revised: 04/21/2011 Document Reviewed: 05/07/2005 [facility] Patient Information 2015 [place], [place]. This information is not intended to replace advice given to you by your health care provider. Make sure you discuss any questions you have with your health care provider.  Alcohol Withdrawal Alcohol withdrawal happens when you normally drink alcohol a lot and suddenly stop drinking. Alcohol withdrawal symptoms can be mild to very bad. Mild withdrawal symptoms can include feeling sick to your stomach (nauseous), headache, or feeling irritable. Bad withdrawal symptoms can include shakiness, being very nervous (anxious), and not thinking clearly.  HOME CARE  Join an alcohol support group.  Stay away from people or situations that make you want to drink.  Eat a healthy diet. Eat a lot of fresh fruits, vegetables, and lean meats. GET HELP RIGHT AWAY IF:   You become confused. You start to see and hear things that are not really  there.  You feel your heart beating very fast.  You throw up (vomit) blood or cannot stop throwing up. This may be bright red or look like black coffee grounds.  You have blood in your poop (stool). This may be bright red, maroon colored, or black and tarry.  You are lightheaded or pass out (faint).  You develop a fever. MAKE SURE YOU:   Understand these instructions.  Will watch your condition.  Will get help right away if you are not doing well or get worse. Document Released: 07/16/2007 Document Revised: 04/21/2011 Document Reviewed: 07/16/2007 [facility] Patient Information 2015 [place], [place]. This information is not intended to replace advice given to you by your health care provider. Make sure you discuss any questions you have with your health care provider.  Emergency Department Resource Guide 1) Find a Doctor and Pay Out of Pocket Although you won't have to find out who is covered by your insurance plan, it is a good idea to ask around and get recommendations. You will then need to call the office and see if the doctor you have chosen will accept you as a new patient and what types of options they offer for patients who are self-pay. Some doctors offer discounts or will set up payment plans for their patients who do not have insurance, but you will need to ask so you aren't surprised when you get to your appointment.  2) Contact Your Local Health Department Not all health departments have doctors that can see patients for sick visits, but many do, so it is worth a call to see if yours does. If you don't  know where your local health department is, you can check in your phone book. The CDC also has a tool to help you locate your state's health department, and many state websites also have listings of all of their local health departments.  3) Find a Walk-in Clinic If your illness is not likely to be very severe or complicated, you may want to try a walk in clinic. These are  popping up all over the country in pharmacies, drugstores, and shopping centers. They're usually staffed by nurse practitioners or physician assistants that have been trained to treat common illnesses and complaints. They're usually fairly quick and inexpensive. However, if you have serious medical issues or chronic medical problems, these are probably not your best option.  No Primary Care Doctor: - Call Health Connect at  [phone] - they can help you locate a primary care doctor that  accepts your insurance, provides certain services, etc. - Physician Referral Service- [phone]  Chronic Pain Problems: Organization         Address  Phone   Notes  [facility]  [phone] Patients need to be referred by their primary care doctor.   Medication Assistance: Organization         Address  Phone   Notes  [facility] Medication [facility] [address] [place], [place] [address] [phone] --Must be a resident of [facility] -- Must have NO insurance coverage whatsoever (no Medicaid/ Medicare, etc.) -- The pt. MUST have a primary care doctor that directs their care regularly and follows them in the community   [facility]  [phone]   [employer]  [phone]    Agencies that provide inexpensive medical care: Organization         Address  Phone   Notes  [facility]  [phone]   [facility]    [phone]   [facility] [address] [place], [place] [address] [phone]   [facility] [address] [place] [place] [phone]   Planned Parenthood    [phone]   [facility]    [phone]   [facility]  [address], [place] Phone:  [phone], Fax:  [phone] Hours of Operation:  9 am - 6 pm, M-F.  Also accepts Medicaid/Medicare and self-pay.  [facility]  [address], [place] Phone: [phone], Fax: [phone]. Hours of Operation:  8:30 am - 5:30 pm, M-F.  Also accepts Medicaid and self-pay.  [facility] [place] [address] [place] Phone: [phone]   [facility] [address] Natasha Bence [place], [place] [phone], Ext. [phone] Mondays & Thursdays: 7-9 AM.  First 15 patients are seen on a first come, first serve basis.    Medicaid-accepting [facility] Providers:  Organization         Address  Phone   Notes  [facility] [address],  [phone] Also accepts self-pay patients.  [facility] [address] Laurell Josephs [place], [place]  [phone]   [facility] [address], [place] [phone]   [facility] [address], [place] [phone]   Renaye Rakers [address], [place]   [phone] Only accepts [place] Access  Medicaid patients after they have their name applied to their card.   Self-Pay (no insurance) in [facility]:  Organization         Address  Phone   Notes  Sickle Cell Patients, [facility] [address] [place], [place] [phone]   [facility] [address] [place], [place] [phone]   [facility] [place]  [address], [place] [phone]   [facility]/Dr. Osei-Bonsu  [address] [place] or [address], [place] [phone] Phone number for both [place] and [place] locations is the same.  [facility] [address] [place] [phone]   [facility] [address], [place] or [address] [phone]   [facility] [address], [place] [phone], phone; [phone], fax Sees patients 1st and 3rd Saturday  of every month.  Must not qualify for public or private insurance (i.e. Medicaid, Medicare, [place] Health Choice, Veterans' Benefits)  Household income should be no more than 200% of the poverty level The clinic cannot treat you if you are pregnant or think you are pregnant  Sexually transmitted diseases are not treated at the clinic.    Dental Care: Organization         Address  Phone  Notes  [facility] Department of [facility] [address] [place], [place] [phone] Accepts children up to age 49 who are enrolled in [place] or [place] Health Choice; pregnant women with a Medicaid card; and children who have applied for Medicaid or [place] Health Choice, but were declined, whose parents can pay a reduced fee at time of service.  [facility] Department of [facility]  [address], [place] [phone] Accepts children up to age 81 who are enrolled in [place] or [place] Health Choice; pregnant women with a Medicaid card; and children who have applied for Medicaid or [place] Health Choice, but were declined, whose parents can pay a reduced fee at time of service.  [facility]  [address] [place], [place] [phone] Patients are seen by appointment only. Walk-ins are not accepted. [facility] will see patients 81 years of age and older. Monday - Tuesday (8am-5pm) Most Wednesdays (8:30-5pm) $30 per visit, cash only  [facility] Adult Dental Access PROGRAM  [address], [facility] [phone] Patients are seen by appointment only. Walk-ins are not accepted. [facility] will see patients 65 years of age and older. One Wednesday Evening (Monthly: Volunteer Based).  $30 per visit, cash only  [employer]  [phone] for adults; Children under age 66, call Graduate Pediatric Dentistry at [phone]. Children aged 27-14, please call [phone] to request a pediatric application.   Dental services are provided in all areas of dental care including fillings, crowns and bridges, complete and partial dentures, implants, gum treatment, root canals, and extractions. Preventive care is also provided. Treatment is provided to both adults and children. Patients are selected via a lottery and there is often a waiting list.   [facility] [address] [place]  [phone] [email or web address]   [facility] [address] [place], [place] [phone], Ext. [phone] Second and Fourth Thursday of each month, opens at 6:30 AM; Clinic ends at 9 AM.  Patients are seen on a first-come first-served basis, and a limited number are seen during each clinic.   [facility]  [address] Ether Griffins [place], [place] [phone]   Eligibility Requirements You must have lived in [place], [place], or [place] counties for at least the last three months.   You cannot be eligible for state or federal sponsored [place], including [employer], [place], or [employer].   You generally cannot be eligible for healthcare insurance through your employer.    How to apply: Eligibility screenings are held every Tuesday and Wednesday afternoon from 1:00 pm until 4:00 pm. You do not need an appointment for the interview!  [facility] [address] [place], [place] [phone]   [facility] Department  [phone]   [facility] Health Department  [phone]   [facility] Health Department  [phone]    Behavioral Health Resources in the Community: Intensive Outpatient Programs Organization         Address  Phone  Notes  [facility] [place] [facility] [address] [place], [place] [phone]   [facility] Outpatient [address] [place], [place] [phone]   [facility] [address] [place], [place]  [phone]   [facility] Mental Health [address],  [place], [place] [phone] or [phone]   Substance Abuse Resources Organization         Address  Phone  Notes  [facility] Drug Services  [phone]   [facility]  [phone]   [facility] [place]  [phone]   Floydene Flock  [phone]   Residential & Outpatient Substance Abuse Program  [phone]   Psychological Services Organization         Address  Phone  Notes  [facility] Behavioral Health  [phone]   [facility]  [phone]   [facility] Mental Health [address], [place] [phone] or [phone]    Mobile Crisis Teams Organization         Address  Phone  Notes  [facility], Mobile Crisis Care Unit  [phone]   [facility]  [address] [place], [place] [phone]   Doristine Locks [address] [place] [phone]    Self-Help/Support Groups Organization         Address  Phone             Notes  [facility] [place] - variety of support groups  [phone]- I7437963 Call for more information  Narcotics Anonymous (NA), [facility] [address], [employer] [place]  2 meetings at this location   Statistician         Address  Phone  Notes  [facility] [address],    [place]  [phone]   [facility]  [address] [place] [address], [place], [place] [phone]   [facility] [address] [place], [place] [phone] Admissions: 8am-3pm M-F  [facility] [address]    [place], [place] [phone]   [facility] [address] Starling Manns [place], [place] [phone]   [facility] [address],  [place], [place] [phone]   [facility] - Intensive Outpatient [address] Laurell Josephs [address], [place], [place] [phone]   [facility] ([facility] [address] [place]  [place], [place]  [phone] or [phone]   [facility] ([facility]) [address] [place], [place] [phone] Accepts Medicaid  [facility] [address]  [place] [phone] Substance Abuse/Addiction Treatment   [facility] Behavioral Health Resources Organization         Address  Phone  Notes  [facility]  [phone]   Angie FavaJulie Brannon, PhD [address] [place], [place]   [phone] or [phone]   [facility]   [address] [place], [place] [phone]   [facility] [address], [place], [place] [phone] Insurance/Medicaid/sponsorship through [facility]  [facility] [address]                                    [place], [place] [phone] Therapy/tele-psych/case  [facility] [address]   [place], [place] [phone]    Dr. Lolly MustacheArfeen  [phone]   [facility] [place]  [facility] 1) [address], [place] 2) [address], [place] 3)  [address], [place] [phone]  [phone]) [phone]   [facility] Child Abuse Hotline [phone] or [phone](336) [phone] (After Hours)

## 2014-10-08 NOTE — ED Notes (Signed)
Pt returned to triage, stating that he wanted to be committed, pt alert, able to answer questions, denies any SI or HI when asked, states " I want to go to a facility now", explained to pt that getting into a detox facility can take time and that we did not offer inpatient detox, explained to pt that he was given referral options on his discharge paperwork, pt states that he did not get paperwork at discharge, friend with pt had his discharge paperwork, RN showed pt and friend resource list on his discharge paperwork. Pt complained that he did not have anywhere to stay tonight, advised pt that EDP had spoken with his mother and that he could stay there today, that she was aware he was coming home, pt expressed again his wishes for being committed for detox tonight, RN explained to pt that he had been evaluated and was eligible for discharge and outpatient resources, pt again denies any si or hi, pt becomes upset, walks out of triage,

## 2014-10-09 ENCOUNTER — Inpatient Hospital Stay (HOSPITAL_COMMUNITY)
Admission: AD | Admit: 2014-10-09 | Discharge: 2014-10-13 | DRG: 897 | Disposition: A | Discharge status: Home / Self Care | Payer: BLUE CROSS/BLUE SHIELD | Source: Intra-hospital | Attending: Psychiatry | Admitting: Psychiatry

## 2014-10-09 ENCOUNTER — Encounter (HOSPITAL_COMMUNITY): Payer: Self-pay | Admitting: *Deleted

## 2014-10-09 ENCOUNTER — Encounter (HOSPITAL_COMMUNITY): Payer: Self-pay | Admitting: General Practice

## 2014-10-09 ENCOUNTER — Emergency Department (HOSPITAL_COMMUNITY)
Admission: EM | Admit: 2014-10-09 | Discharge: 2014-10-09 | Disposition: A | Discharge status: Other Acute Inpatient Hospital | Payer: BLUE CROSS/BLUE SHIELD | Attending: Emergency Medicine | Admitting: Emergency Medicine

## 2014-10-09 DIAGNOSIS — Z79899 Other long term (current) drug therapy: Secondary | ICD-10-CM | POA: Insufficient documentation

## 2014-10-09 DIAGNOSIS — F1510 Other stimulant abuse, uncomplicated: Secondary | ICD-10-CM | POA: Insufficient documentation

## 2014-10-09 DIAGNOSIS — R45850 Homicidal ideations: Secondary | ICD-10-CM

## 2014-10-09 DIAGNOSIS — F1120 Opioid dependence, uncomplicated: Secondary | ICD-10-CM | POA: Diagnosis present

## 2014-10-09 DIAGNOSIS — I10 Essential (primary) hypertension: Secondary | ICD-10-CM | POA: Insufficient documentation

## 2014-10-09 DIAGNOSIS — F10120 Alcohol abuse with intoxication, uncomplicated: Secondary | ICD-10-CM | POA: Insufficient documentation

## 2014-10-09 DIAGNOSIS — Z008 Encounter for other general examination: Secondary | ICD-10-CM | POA: Diagnosis present

## 2014-10-09 DIAGNOSIS — F1994 Other psychoactive substance use, unspecified with psychoactive substance-induced mood disorder: Secondary | ICD-10-CM | POA: Diagnosis present

## 2014-10-09 DIAGNOSIS — Z720 Tobacco use: Secondary | ICD-10-CM | POA: Insufficient documentation

## 2014-10-09 DIAGNOSIS — Y906 Blood alcohol level of 120-199 mg/100 ml: Secondary | ICD-10-CM | POA: Diagnosis present

## 2014-10-09 DIAGNOSIS — F1024 Alcohol dependence with alcohol-induced mood disorder: Secondary | ICD-10-CM | POA: Diagnosis present

## 2014-10-09 DIAGNOSIS — F1310 Sedative, hypnotic or anxiolytic abuse, uncomplicated: Secondary | ICD-10-CM | POA: Diagnosis not present

## 2014-10-09 DIAGNOSIS — F17210 Nicotine dependence, cigarettes, uncomplicated: Secondary | ICD-10-CM | POA: Diagnosis present

## 2014-10-09 DIAGNOSIS — F10230 Alcohol dependence with withdrawal, uncomplicated: Secondary | ICD-10-CM | POA: Insufficient documentation

## 2014-10-09 DIAGNOSIS — F10920 Alcohol use, unspecified with intoxication, uncomplicated: Secondary | ICD-10-CM

## 2014-10-09 DIAGNOSIS — F39 Unspecified mood [affective] disorder: Secondary | ICD-10-CM | POA: Diagnosis present

## 2014-10-09 DIAGNOSIS — F1020 Alcohol dependence, uncomplicated: Secondary | ICD-10-CM | POA: Diagnosis present

## 2014-10-09 DIAGNOSIS — Z791 Long term (current) use of non-steroidal anti-inflammatories (NSAID): Secondary | ICD-10-CM | POA: Insufficient documentation

## 2014-10-09 DIAGNOSIS — F1910 Other psychoactive substance abuse, uncomplicated: Secondary | ICD-10-CM

## 2014-10-09 DIAGNOSIS — F1124 Opioid dependence with opioid-induced mood disorder: Secondary | ICD-10-CM | POA: Diagnosis present

## 2014-10-09 LAB — CBC WITH DIFFERENTIAL/PLATELET
Basophils Absolute: 0.1 10*3/uL (ref 0.0–0.1)
Basophils Relative: 0 % (ref 0–1)
EOS ABS: 0.2 10*3/uL (ref 0.0–0.7)
Eosinophils Relative: 2 % (ref 0–5)
HCT: 42.7 % (ref 39.0–52.0)
HEMOGLOBIN: 15.4 g/dL (ref 13.0–17.0)
LYMPHS ABS: 2.8 10*3/uL (ref 0.7–4.0)
LYMPHS PCT: 25 % (ref 12–46)
MCH: 32.6 pg (ref 26.0–34.0)
MCHC: 36.1 g/dL — ABNORMAL HIGH (ref 30.0–36.0)
MCV: 90.3 fL (ref 78.0–100.0)
Monocytes Absolute: 0.6 10*3/uL (ref 0.1–1.0)
Monocytes Relative: 5 % (ref 3–12)
NEUTROS PCT: 68 % (ref 43–77)
Neutro Abs: 7.6 10*3/uL (ref 1.7–7.7)
Platelets: 177 10*3/uL (ref 150–400)
RBC: 4.73 MIL/uL (ref 4.22–5.81)
RDW: 12.9 % (ref 11.5–15.5)
WBC: 11.2 10*3/uL — ABNORMAL HIGH (ref 4.0–10.5)

## 2014-10-09 LAB — COMPREHENSIVE METABOLIC PANEL
ALT: 16 U/L — ABNORMAL LOW (ref 17–63)
AST: 29 U/L (ref 15–41)
Albumin: 4.6 g/dL (ref 3.5–5.0)
Alkaline Phosphatase: 59 U/L (ref 38–126)
Anion gap: 9 (ref 5–15)
BILIRUBIN TOTAL: 0.6 mg/dL (ref 0.3–1.2)
BUN: 15 mg/dL (ref 6–20)
CO2: 23 mmol/L (ref 22–32)
CREATININE: 1.08 mg/dL (ref 0.61–1.24)
Calcium: 8.6 mg/dL — ABNORMAL LOW (ref 8.9–10.3)
Chloride: 106 mmol/L (ref 101–111)
Glucose, Bld: 77 mg/dL (ref 65–99)
POTASSIUM: 3.9 mmol/L (ref 3.5–5.1)
Sodium: 138 mmol/L (ref 135–145)
TOTAL PROTEIN: 7.6 g/dL (ref 6.5–8.1)

## 2014-10-09 LAB — RAPID URINE DRUG SCREEN, HOSP PERFORMED
AMPHETAMINES: POSITIVE — AB
BENZODIAZEPINES: POSITIVE — AB
Barbiturates: NOT DETECTED
Cocaine: NOT DETECTED
Opiates: NOT DETECTED
Tetrahydrocannabinol: NOT DETECTED

## 2014-10-09 LAB — ETHANOL: Alcohol, Ethyl (B): 124 mg/dL — ABNORMAL HIGH (ref <5)

## 2014-10-09 MED ORDER — DICYCLOMINE HCL 20 MG PO TABS: 20.0000 mg | ORAL_TABLET | Freq: Four times a day (QID) | ORAL | Status: DC | PRN

## 2014-10-09 MED ORDER — LOPERAMIDE HCL 2 MG PO CAPS: 2.0000 mg | ORAL_CAPSULE | ORAL | Status: AC | PRN

## 2014-10-09 MED ORDER — ONDANSETRON 4 MG PO TBDP
4.0000 mg | ORAL_TABLET | Freq: Four times a day (QID) | ORAL | Status: AC | PRN
  Administered 2014-10-09 – 2014-10-11 (×4): 4 mg via ORAL
  Filled 2014-10-09 (×6): qty 1

## 2014-10-09 MED ORDER — CLONIDINE HCL 0.1 MG PO TABS
0.1000 mg | ORAL_TABLET | Freq: Three times a day (TID) | ORAL | Status: DC
Start: 2014-10-09 — End: 2014-10-09
  Administered 2014-10-09: 0.1 mg via ORAL
  Filled 2014-10-09: qty 1

## 2014-10-09 MED ORDER — METOPROLOL TARTRATE 25 MG PO TABS
25.0000 mg | ORAL_TABLET | Freq: Two times a day (BID) | ORAL | Status: DC
  Administered 2014-10-09 – 2014-10-13 (×8): 25 mg via ORAL
  Filled 2014-10-09 (×13): qty 1

## 2014-10-09 MED ORDER — AMPHETAMINE-DEXTROAMPHETAMINE 10 MG PO TABS
30.0000 mg | ORAL_TABLET | Freq: Every day | ORAL | Status: DC
  Administered 2014-10-09: 30 mg via ORAL
  Filled 2014-10-09: qty 3

## 2014-10-09 MED ORDER — ONDANSETRON HCL 4 MG PO TABS
4.0000 mg | ORAL_TABLET | Freq: Three times a day (TID) | ORAL | Status: DC | PRN
  Administered 2014-10-09: 4 mg via ORAL
  Filled 2014-10-09: qty 1

## 2014-10-09 MED ORDER — CLONIDINE HCL 0.1 MG PO TABS
0.1000 mg | ORAL_TABLET | Freq: Four times a day (QID) | ORAL | Status: AC
  Administered 2014-10-09 – 2014-10-11 (×9): 0.1 mg via ORAL
  Filled 2014-10-09 (×13): qty 1

## 2014-10-09 MED ORDER — MAGNESIUM HYDROXIDE 400 MG/5ML PO SUSP: 30.0000 mL | Freq: Every day | ORAL | Status: DC | PRN

## 2014-10-09 MED ORDER — ZOLPIDEM TARTRATE 5 MG PO TABS: 5.0000 mg | ORAL_TABLET | Freq: Every evening | ORAL | Status: DC | PRN

## 2014-10-09 MED ORDER — THIAMINE HCL 100 MG/ML IJ SOLN
100.0000 mg | Freq: Once | INTRAMUSCULAR | Status: AC
  Administered 2014-10-09: 100 mg via INTRAMUSCULAR
  Filled 2014-10-09: qty 2

## 2014-10-09 MED ORDER — ADULT MULTIVITAMIN W/MINERALS CH
1.0000 | ORAL_TABLET | Freq: Every day | ORAL | Status: DC
  Administered 2014-10-09 – 2014-10-13 (×5): 1 via ORAL
  Filled 2014-10-09 (×8): qty 1

## 2014-10-09 MED ORDER — NICOTINE 21 MG/24HR TD PT24
21.0000 mg | MEDICATED_PATCH | Freq: Every day | TRANSDERMAL | Status: DC
  Administered 2014-10-10 – 2014-10-13 (×4): 21 mg via TRANSDERMAL
  Filled 2014-10-09 (×6): qty 1

## 2014-10-09 MED ORDER — POLYVINYL ALCOHOL 1.4 % OP SOLN
OPHTHALMIC | Status: AC
  Filled 2014-10-09: qty 15

## 2014-10-09 MED ORDER — CHLORDIAZEPOXIDE HCL 25 MG PO CAPS: 25.0000 mg | ORAL_CAPSULE | Freq: Four times a day (QID) | ORAL | Status: AC | PRN

## 2014-10-09 MED ORDER — ACETAMINOPHEN 325 MG PO TABS: 650.0000 mg | ORAL_TABLET | ORAL | Status: DC | PRN

## 2014-10-09 MED ORDER — CLONIDINE HCL 0.1 MG PO TABS
0.1000 mg | ORAL_TABLET | ORAL | Status: DC
  Administered 2014-10-12 – 2014-10-13 (×3): 0.1 mg via ORAL
  Filled 2014-10-09 (×4): qty 1

## 2014-10-09 MED ORDER — CHLORDIAZEPOXIDE HCL 25 MG PO CAPS
25.0000 mg | ORAL_CAPSULE | ORAL | Status: AC
  Administered 2014-10-12 (×2): 25 mg via ORAL
  Filled 2014-10-09 (×2): qty 1

## 2014-10-09 MED ORDER — POLYVINYL ALCOHOL 1.4 % OP SOLN
1.0000 [drp] | OPHTHALMIC | Status: DC | PRN
  Administered 2014-10-09: 1 [drp] via OPHTHALMIC
  Filled 2014-10-09: qty 15

## 2014-10-09 MED ORDER — IBUPROFEN 400 MG PO TABS
600.0000 mg | ORAL_TABLET | Freq: Three times a day (TID) | ORAL | Status: DC | PRN
  Administered 2014-10-09: 600 mg via ORAL
  Filled 2014-10-09: qty 2

## 2014-10-09 MED ORDER — HYDROCHLOROTHIAZIDE 12.5 MG PO CAPS
12.5000 mg | ORAL_CAPSULE | Freq: Every day | ORAL | Status: DC
  Administered 2014-10-09 – 2014-10-13 (×5): 12.5 mg via ORAL
  Filled 2014-10-09 (×8): qty 1

## 2014-10-09 MED ORDER — NICOTINE 21 MG/24HR TD PT24
21.0000 mg | MEDICATED_PATCH | Freq: Every day | TRANSDERMAL | Status: DC
  Administered 2014-10-09: 21 mg via TRANSDERMAL
  Filled 2014-10-09: qty 1

## 2014-10-09 MED ORDER — CHLORDIAZEPOXIDE HCL 25 MG PO CAPS
25.0000 mg | ORAL_CAPSULE | Freq: Three times a day (TID) | ORAL | Status: AC
  Administered 2014-10-11 (×3): 25 mg via ORAL
  Filled 2014-10-09 (×3): qty 1

## 2014-10-09 MED ORDER — LORAZEPAM 1 MG PO TABS
1.0000 mg | ORAL_TABLET | Freq: Three times a day (TID) | ORAL | Status: DC | PRN
  Administered 2014-10-09: 1 mg via ORAL
  Filled 2014-10-09: qty 1

## 2014-10-09 MED ORDER — ALUM & MAG HYDROXIDE-SIMETH 200-200-20 MG/5ML PO SUSP: 30.0000 mL | ORAL | Status: DC | PRN

## 2014-10-09 MED ORDER — METHOCARBAMOL 500 MG PO TABS
500.0000 mg | ORAL_TABLET | Freq: Three times a day (TID) | ORAL | Status: DC | PRN
  Administered 2014-10-09 – 2014-10-13 (×7): 500 mg via ORAL
  Filled 2014-10-09 (×8): qty 1

## 2014-10-09 MED ORDER — VITAMIN B-1 100 MG PO TABS
100.0000 mg | ORAL_TABLET | Freq: Every day | ORAL | Status: DC
Start: 2014-10-10 — End: 2014-10-13
  Administered 2014-10-10 – 2014-10-13 (×4): 100 mg via ORAL
  Filled 2014-10-09 (×6): qty 1

## 2014-10-09 MED ORDER — CHLORDIAZEPOXIDE HCL 25 MG PO CAPS
25.0000 mg | ORAL_CAPSULE | Freq: Every day | ORAL | Status: AC
  Administered 2014-10-13: 25 mg via ORAL
  Filled 2014-10-09: qty 1

## 2014-10-09 MED ORDER — CHLORDIAZEPOXIDE HCL 25 MG PO CAPS
25.0000 mg | ORAL_CAPSULE | Freq: Four times a day (QID) | ORAL | Status: AC
  Administered 2014-10-09 – 2014-10-10 (×6): 25 mg via ORAL
  Filled 2014-10-09 (×6): qty 1

## 2014-10-09 MED ORDER — CLONIDINE HCL 0.1 MG PO TABS
0.1000 mg | ORAL_TABLET | Freq: Every day | ORAL | Status: DC
  Filled 2014-10-09 (×2): qty 1

## 2014-10-09 MED ORDER — NAPROXEN 500 MG PO TABS
500.0000 mg | ORAL_TABLET | Freq: Two times a day (BID) | ORAL | Status: DC | PRN
  Administered 2014-10-09 – 2014-10-12 (×4): 500 mg via ORAL
  Filled 2014-10-09 (×4): qty 1

## 2014-10-09 MED ORDER — TRAZODONE HCL 50 MG PO TABS
50.0000 mg | ORAL_TABLET | Freq: Every evening | ORAL | Status: DC | PRN
  Administered 2014-10-09 – 2014-10-12 (×4): 50 mg via ORAL
  Filled 2014-10-09 (×4): qty 1

## 2014-10-09 MED ORDER — ACETAMINOPHEN 325 MG PO TABS
650.0000 mg | ORAL_TABLET | Freq: Four times a day (QID) | ORAL | Status: DC | PRN
  Administered 2014-10-12 (×2): 650 mg via ORAL
  Filled 2014-10-09 (×2): qty 2

## 2014-10-09 MED ORDER — HYDROXYZINE HCL 25 MG PO TABS: 25.0000 mg | ORAL_TABLET | Freq: Four times a day (QID) | ORAL | Status: AC | PRN
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
## 2014-10-09 NOTE — Progress Notes (Signed)
Accepted to [facility] bed 301-2 by Dr. Dub Mikes, per Minerva Areola, [facility]. Admission is voluntary.   Spoke with [facility] re: pt's placement.   Ilean Skill, MSW, LCSW Clinical Social Work, Disposition  10/09/2014 Cell: [phone] Office: [phone]
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
## 2014-10-09 NOTE — ED Notes (Signed)
Pt was informed that care will be transferred to Dr. Dub Mikes, and that he would be placed in Bed 301-2 with Behavioral Health. Voluntary commitment signed and faxed. Service contacted for transport.

## 2014-10-09 NOTE — ED Provider Notes (Signed)
CSN: [id number]     Arrival date & time 10/09/14  0142 History   First MD Initiated Contact with Patient 10/09/14 0157     Chief Complaint  Patient presents with  . V70.1     (Consider location/radiation/quality/duration/timing/severity/associated sxs/prior Treatment) The history is provided by the patient.   34 year old male returns to the emergency department stating that he needs to get help with detox. He states that he has been using multiple medications including benzodiazepine, Suboxone, alcohol, and feta means. He had been seen in the ED earlier and given information for available detox centers but he states that he is afraid that if he goes back home, he will get into another fight with his father which is what precipitated his coming to the ED originally. He states that he has homicidal ideation that he once to kill his father. When I asked him what he would do, he stated "you don't want to know".  Past Medical History  Diagnosis Date  . Hypertension   . Depression   . Anxiety    History reviewed. No pertinent past surgical history. No family history on file. Social History  Substance Use Topics  . Smoking status: Current Every Day Smoker    Types: Cigarettes  . Smokeless tobacco: None  . Alcohol Use: Yes     Comment: daily    Review of Systems  All other systems reviewed and are negative.     Allergies  Review of patient's allergies indicates no known allergies.  Home Medications   Prior to Admission medications   Medication Sig Start Date End Date Taking? Authorizing Provider  ibuprofen (ADVIL,MOTRIN) 800 MG tablet Take 1 tablet (800 mg total) by mouth 3 (three) times daily. 11/04/13  Yes Gilda Crease, MD  naproxen sodium (ALEVE) 220 MG tablet Take 440 mg by mouth 2 (two) times daily as needed (Pain).   Yes Historical Provider, MD  amphetamine-dextroamphetamine (ADDERALL) 30 MG tablet Take 30 mg by mouth daily. 09/06/14   Historical Provider, MD   HYDROcodone-acetaminophen (NORCO/VICODIN) 5-325 MG per tablet Take 1-2 tablets by mouth every 4 (four) hours as needed for moderate pain. 11/04/13   Gilda Crease, MD   BP 160/116 mmHg  Pulse 112  Temp(Src) 98.6 F (37 C)  Resp 20  SpO2 97% Physical Exam  Nursing note and vitals reviewed.  34 year old male, resting comfortably and in no acute distress. Vital signs are significant for tachycardia and hypertension. Oxygen saturation is 97%, which is normal. Head is normocephalic and atraumatic. PERRLA, EOMI. Oropharynx is clear. Neck is nontender and supple without adenopathy or JVD. Back is nontender and there is no CVA tenderness. Lungs are clear without rales, wheezes, or rhonchi. Chest is nontender. Heart has regular rate and rhythm without murmur. Abdomen is soft, flat, nontender without masses or hepatosplenomegaly and peristalsis is normoactive. Extremities have no cyanosis or edema, full range of motion is present. Skin is warm and dry without rash. Neurologic: Mental status is normal, cranial nerves are intact, there are no motor or sensory deficits. Psychiatric: He is angry and somewhat oppositional.  ED Course  Procedures (including critical care time) Labs Review Results for orders placed or performed during the hospital encounter of 10/09/14  Comprehensive metabolic panel  Result Value Ref Range   Sodium 138 135 - 145 mmol/L   Potassium 3.9 3.5 - 5.1 mmol/L   Chloride 106 101 - 111 mmol/L   CO2 23 22 - 32 mmol/L   Glucose, Bld  77 65 - 99 mg/dL   BUN 15 6 - 20 mg/dL   Creatinine, Ser 4.09 0.61 - 1.24 mg/dL   Calcium 8.6 (L) 8.9 - 10.3 mg/dL   Total Protein 7.6 6.5 - 8.1 g/dL   Albumin 4.6 3.5 - 5.0 g/dL   AST 29 15 - 41 U/L   ALT 16 (L) 17 - 63 U/L   Alkaline Phosphatase 59 38 - 126 U/L   Total Bilirubin 0.6 0.3 - 1.2 mg/dL   GFR calc non Af Amer >60 >60 mL/min   GFR calc Af Amer >60 >60 mL/min   Anion gap 9 5 - 15  CBC with Differential  Result  Value Ref Range   WBC 11.2 (H) 4.0 - 10.5 K/uL   RBC 4.73 4.22 - 5.81 MIL/uL   Hemoglobin 15.4 13.0 - 17.0 g/dL   HCT 81.1 91.4 - 78.2 %   MCV 90.3 78.0 - 100.0 fL   MCH 32.6 26.0 - 34.0 pg   MCHC 36.1 (H) 30.0 - 36.0 g/dL   RDW 95.6 21.3 - 08.6 %   Platelets 177 150 - 400 K/uL   Neutrophils Relative % 68 43 - 77 %   Neutro Abs 7.6 1.7 - 7.7 K/uL   Lymphocytes Relative 25 12 - 46 %   Lymphs Abs 2.8 0.7 - 4.0 K/uL   Monocytes Relative 5 3 - 12 %   Monocytes Absolute 0.6 0.1 - 1.0 K/uL   Eosinophils Relative 2 0 - 5 %   Eosinophils Absolute 0.2 0.0 - 0.7 K/uL   Basophils Relative 0 0 - 1 %   Basophils Absolute 0.1 0.0 - 0.1 K/uL  Ethanol  Result Value Ref Range   Alcohol, Ethyl (B) 124 (H) <5 mg/dL   I have personally reviewed and evaluated these lab results as part of my medical decision-making.  MDM   Final diagnoses:  Homicidal ideation  Alcohol intoxication, uncomplicated  Polysubstance abuse    Polysubstance abuse with expressed desire for detox. Review of old records confirms ED visit earlier tonight but labs were not drawn at that time. Given his expressed homicidal ideation, I will have TTS evaluate the patient to see if he needs inpatient care for his depression and homicidal ideation. Screening labs are obtained.  TTS consult is appreciated. Based on homicidal ideation, new wish him to be admitted for inpatient care. Psychiatric placement is pending.  Dione Booze, MD 10/09/14 [phone]

## 2014-10-09 NOTE — ED Notes (Signed)
[facility] here to transport pt to [facility]

## 2014-10-09 NOTE — BH Assessment (Signed)
Per Hulan Fess, NP pt meets inpt criteria. Per [facility] pt can be considered for [facility] admission later in AM if blood pressure has stabilized. TTS seeking placement. 22 facilities contacted and none currently have beds. [facility] reports they have beds. [facility] did not answer the phone.   Sent referrals to [facility] and [facility]  Clista Bernhardt, [facility] Triage Specialist 10/09/2014 3:45 AM

## 2014-10-09 NOTE — Progress Notes (Signed)
Adult Psychoeducational Group Note  Date:  10/09/2014 Time:  9:26 PM  Group Topic/Focus:  Wrap-Up Group:   The focus of this group is to help patients review their daily goal of treatment and discuss progress on daily workbooks.  Participation Level:  Did Not Attend  Additional Comments:  Pt was invited to group, however he stayed in bed.  Caswell Corwin 10/09/2014, 9:26 PM

## 2014-10-09 NOTE — ED Notes (Signed)
Pt complaining of opiate withdrawal from suboxone. C/o H/A, back pain, nausea, abd pain.Marland KitchenMarland Kitchen6/10 NPS. States anxiety. Pt medicated for anxiety, pain, nausea per orders. V/S obtained and as noted.

## 2014-10-09 NOTE — Progress Notes (Signed)
D: Pt reports withdrawal symptoms of sweating and shakiness. Pt reports decreased nausea. Pt denied any SI/HI/AVH. Pt reports having back pain rated at a level of 7 out of 10 with 10 being the most severe. Pt in bed for the majority of the shift. Pt appeared restless while observed in the bed during initial approach.   A: Writer administered scheduled and prn medications to pt, per MD orders. Ginger ale given to help pt with any upset GI. Continued support and availability as needed was extended to this pt. Staff continue to monitor pt with q51min checks.  R: No adverse drug reactions noted. Pt receptive to treatment. Pt remains safe at this time.

## 2014-10-09 NOTE — ED Notes (Signed)
Telepsych assessment completed.  

## 2014-10-09 NOTE — Tx Team (Signed)
Initial Interdisciplinary Treatment Plan   PATIENT STRESSORS: Financial difficulties Legal issue Marital or family conflict Substance abuse   PATIENT STRENGTHS: Average or above average intelligence General fund of knowledge   PROBLEM LIST: Problem List/Patient Goals Date to be addressed Date deferred Reason deferred Estimated date of resolution  Homicidal Ideation 10/09/2014           Substance Abuse 10/09/2014           Depression 10/09/2014           Anxiety 10/09/2014           Patient did not determine any goals, "I hadn't even thought about it, I don't know."       DISCHARGE CRITERIA:  Improved stabilization in mood, thinking, and/or behavior Verbal commitment to aftercare and medication compliance Withdrawal symptoms are absent or subacute and managed without 24-hour nursing intervention  PRELIMINARY DISCHARGE PLAN: Attend PHP/IOP Attend 12-step recovery group Outpatient therapy  PATIENT/FAMIILY INVOLVEMENT: This treatment plan has been presented to and reviewed with the patient, Alexander Pope.  The patient and family have been given the opportunity to ask questions and make suggestions.  Marzetta Board E 10/09/2014, 6:31 PM

## 2014-10-09 NOTE — BH Assessment (Addendum)
Tele Assessment Note   Alexander Pope is an 34 y.o. male. Presenting to ED after verbal conflict with father requesting inpt detox. Pt reports he was working on his mother's car all day and then his dad came home and began yelling at him, calling him names and saying he needed to be committed. Pt came to ED requesting detox and complaining of back pain, he was given OP resources and discharged. Pt began agitated and was adamant that he needed to be admitted somewhere. At the time of assessment pt was irritable and depressed with labile affect. He was oriented times 4, with partial judgement. Pt denies SI, HI, and AVH. He reports he self injures out of boredom at times, putting cigarettes out on his arms. Pt reports 2 past suicide attempts 3-5 years ago but reports he can not recall what led to him doing that. Pt reports he has a lot of stressors. He reports he lives with his parents and his two children, and his parents care for the children. "I am basically just there." Pt reports his children's mother is in jail. Pt has a pending DUI charge, and conflict with his father. Pt reports he has no friends, and no place to go, he is requesting inpt detox.   Pt reports he has been feeling depressed for about two weeks. He endorses crying spells, irritability, loss of pleasure, trouble maintaining motivation, decreased grooming and self care, loss of appetite and trouble initiating and maintaining sleep. Pt reports agitated, high energy states, "where I feel like I could run through that wall" lasting 1-2 hours.   Pt reports panic attacks about once a week, triggered by thinking about his faults. Reports he worries about "anything" and "everything" all of the time, and that this worry interferes with functioning. Denies hx of abuse or neglect, denies trauma hx.   Pt reports he began drinking heavily in his mid twenties, drinking 12-18 beers for the last several years. He recently went to rehab in [facility], and  relapsed about a week after his release. He has been drinking about a 6 pack a day for the last two months, and has a DUI pending. Pt reports he was prescribed pain pills many years ago for injury and began to abuse them. He reports his last taper dose of suboxone was 10-02-14, and he feels this may be why his mood has been so irritable lately. Pt uses THC infrequently, and has tried cocaine and heroin several times.   Family hx is positive for etoh abuse. Pt denies hx of MH and SI concerns. Pt is able to contract for safety, and is requesting inpt detox.   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 Hypertension   . Depression   . Anxiety     History reviewed. No pertinent past surgical history.  Family History: No family history on file.  Social History:  reports that he has been smoking Cigarettes.  He does not have any smokeless tobacco history on file. He reports that he drinks alcohol. He reports that he does not use illicit drugs.  Additional Social History:  Alcohol / Drug Use Pain Medications: See PTA, reports hx of abuse with last suboxone taper dose 10/02/14  4 mg  Prescriptions: See PTA Over the Counter: See PTA History of alcohol / drug use?: Yes Longest period of sobriety (when/how long): 1 weeks for etoh, no hx of seizures  Negative Consequences of Use: Financial, Legal, Personal relationships, Work / School Withdrawal Symptoms:  (none reported at this time) Substance #1 Name of Substance 1: etoh  1 - Age of First Use: mid 86s when heavy drinking began per pt 1 - Amount (size/oz): prior to rehab 3 months ago 12-18 beers and a fifth, currently about a 6 pack per day  1 - Frequency: daily  1 - Duration:  about 2 months at current level, years of drinking about 18 beers per day  1 - Last Use / Amount: 8-28 through 8-29, with last drink about 45 minute prior to arrival, about a 6 pack  Substance #2 Name of Substance 2: THC 2 - Age of First Use: 16 2 - Amount (size/oz): varies 2 - Frequency: infrequently, pt unsure 2 - Duration: on and off for years 2 - Last Use / Amount: months ago  Substance #3 Name of Substance 3: opiates 3 - Age of First Use: pt reports about 20 years ago, reports prescribed due to injury and then began taking more than prescribed  3 - Amount (size/oz): varied 3 - Frequency: daily  3 - Duration: years 3 - Last Use / Amount: reports was placed on suboxone therapy, and had last taper dose on 09/12/14 4 mg  CIWA: CIWA-Ar BP: (!) 160/116 mmHg Pulse Rate: 112 COWS:    PATIENT STRENGTHS: (choose at least two) Average or above average intelligence Work skills  Allergies: No Known Allergies  Home Medications:  (Not in a hospital admission)  OB/GYN Status:  No LMP for male patient.  General Assessment Data Location of Assessment: [facility] TTS Assessment: In system Is this a Tele or Face-to-Face Assessment?: Tele Assessment Is this an Initial Assessment or a Re-assessment for this encounter?: Initial Assessment Marital status: Single Is patient pregnant?: No Pregnancy Status: No Living Arrangements: Children, Parent Can pt return to current living arrangement?: Yes Admission Status: Voluntary Is patient capable of signing voluntary admission?: Yes Referral Source: Self/Family/Friend Insurance type: none     Crisis Care Plan Living Arrangements: Children, Parent Name of Psychiatrist: [facility] Name of Therapist: [facility]  Education Status Is patient currently in school?: No Current Grade: NA Highest grade of school patient has completed: [employer] Name of school: NA Contact person: NA  Risk to self with the past 6 months Suicidal Ideation:  No Has patient been a risk to self within the past 6 months prior to admission? : No Suicidal Intent: No Has patient had any suicidal intent within the past 6 months prior to admission? : No Is patient at risk for suicide?: No Suicidal Plan?: No Has patient had any suicidal plan within the past 6 months prior to admission? : No Access to Means: No What has been your use of drugs/alcohol within the last 12 months?: Pt has hx of abusing alcohol, and opiates. Has tried cocaine, and heroin several times, reports infrequent use of THC Previous Attempts/Gestures: Yes How many times?: 2 Other Self Harm Risks: drinking and driving -pending DUI Triggers for Past Attempts: Unknown (pt reports he can not recall it was 3-5 years ago ) Intentional Self Injurious Behavior: Burning Comment - Self Injurious Behavior: reports has burned himself intentionally out of boredom  Family Suicide History: No Recent stressful life event(s): Other (Comment), Conflict (Comment) (  mother of children in jail, conflict with father ) Persecutory voices/beliefs?: No Depression: Yes Depression Symptoms: Despondent, Insomnia, Tearfulness, Isolating, Guilt, Loss of interest in usual pleasures, Feeling worthless/self pity, Feeling angry/irritable Substance abuse history and/or treatment for substance abuse?: Yes Suicide prevention information given to non-admitted patients: Yes  Risk to Others within the past 6 months Homicidal Ideation: No Does patient have any lifetime risk of violence toward others beyond the six months prior to admission? : No Thoughts of Harm to Others: No Current Homicidal Intent: No Current Homicidal Plan: No Access to Homicidal Means: No Identified Victim: none History of harm to others?: No Assessment of Violence: None Noted Violent Behavior Description: none Does patient have access to weapons?: No Criminal Charges Pending?: Yes Describe Pending Criminal Charges: DUI Does patient have a  court date: Yes Court Date:  (pt does not recall ) Is patient on probation?: No  Psychosis Hallucinations: None noted Delusions: None noted  Mental Status Report Appearance/Hygiene: Unremarkable Eye Contact: Good Motor Activity: Unremarkable Speech: Logical/coherent Level of Consciousness: Alert Mood: Depressed, Irritable Affect: Labile Anxiety Level: Moderate Thought Processes: Coherent, Relevant Judgement: Partial Orientation: Person, Place, Time, Situation Obsessive Compulsive Thoughts/Behaviors: None  Cognitive Functioning Concentration: Decreased Memory: Recent Intact, Remote Intact IQ: Average Insight: Fair Impulse Control: Poor Appetite: Poor Weight Loss:  (reports can not recall last time he ate) Weight Gain: 0 Sleep: Decreased Total Hours of Sleep: 3 (reports three hours in last two days) Vegetative Symptoms: Decreased grooming  ADLScreening [facility] Assessment Services) Patient's cognitive ability adequate to safely complete daily activities?: Yes Patient able to express need for assistance with ADLs?: Yes Independently performs ADLs?: Yes (appropriate for developmental age)  Prior Inpatient Therapy Prior Inpatient Therapy: Yes Prior Therapy Dates: Discharged June 2016  Prior Therapy Facilty/Provider(s): [facility] [place]  Reason for Treatment: SA  Prior Outpatient Therapy Prior Outpatient Therapy: Yes Prior Therapy Dates: ongoing  Prior Therapy Facilty/Provider(s): [facility] Reason for Treatment: SA Does patient have an ACCT team?: No Does patient have Intensive In-House Services?  : No Does patient have [facility] services? : No Does patient have P4CC services?: No  ADL Screening (condition at time of admission) Patient's cognitive ability adequate to safely complete daily activities?: Yes Is the patient deaf or have difficulty hearing?: No Does the patient have difficulty concentrating, remembering, or making decisions?: Yes  (hx ADHD) Patient able to express need for assistance with ADLs?: Yes Does the patient have difficulty dressing or bathing?: No Independently performs ADLs?: Yes (appropriate for developmental age) Does the patient have difficulty walking or climbing stairs?: Yes (left side numbness but does not regularly use a device) Weakness of Legs: Left Weakness of Arms/Hands: None  Home Assistive Devices/Equipment Home Assistive Devices/Equipment:  (none regularlly reports has used a cane in the past at times)    Abuse/Neglect Assessment (Assessment to be complete while patient is alone) Physical Abuse: Denies Verbal Abuse: Denies Sexual Abuse: Denies Exploitation of patient/patient's resources: Denies Self-Neglect: Denies Values / Beliefs Cultural Requests During Hospitalization: None Spiritual Requests During Hospitalization: None   Advance Directives (For Healthcare) Does patient have an advance directive?: No Would patient like information on creating an advanced directive?: No - patient declined information Nutrition Screen- [facility] Adult/[facility] Patient's home diet: Regular Has the patient recently lost weight without trying?: Patient is unsure Has the patient been eating poorly because of a decreased appetite?: Yes Malnutrition Screening Tool Score: 3  Additional Information 1:1 In Past 12 Months?: No CIRT Risk: No  Elopement Risk: No Does patient have medical clearance?: No (labs pending )     Disposition:  Per Hulan Fess NP pt meets inpt criteria. Per [facility], pt to be reviewed in AM for possible [facility] placement if Blood pressure is stabilized within the normal range. TTS will also seek placement.   Informed Janette RN of recommendations.   Informed Dr. Preston Fleeting of recommendations.   Clista Bernhardt, [facility] Triage Specialist 10/09/2014 3:16 AM  Disposition Initial Assessment Completed for this Encounter: Yes  Cristan Scherzer M 10/09/2014 3:14 AM

## 2014-10-09 NOTE — ED Notes (Signed)
Dr. Judd Lien notified that pt "is having suboxone withdrawal."

## 2014-10-09 NOTE — ED Notes (Signed)
Care report given to Dot Lanes, RN at [facility]. Awaiting [facility] at this time.

## 2014-10-09 NOTE — Progress Notes (Signed)
Patient ID: Alexander Pope, male   DOB: 09-06-1980, 34 y.o.   MRN: [id number]  [name] is a 34 year old male admitted to [facility] voluntarily for detox. Patient has a history of depression, anxiety, substance abuse, and hypertension. On admission patient's BP was elevated and NP Nwoko was notified. Patient was started on a Clonidine and Librium protocol. He reports withdrawal symptoms and is given Robaxin and Zofran PRN. Patient denies SI and A/V hallucinations however reports HI towards father. He states that he would put his hands on him but has not thought of killing him. Hamilton reports pain that is chronic in his lower back and left leg. He states, "I was taking Percocet for that" however UDS is negative for Opiates. He admits to abusing THC, cocaine, heroin, and xanax. He was given hygiene products and oriented to the unit. Q15 minute safety checks were initiated and are maintained. No distress noted at this time.

## 2014-10-09 NOTE — BH Assessment (Signed)
Reviewed ED notes prior to initiating assessment. Pt presenting to Ed with request for detox, denies SI and HI, but reports he was told to come to ED, and that he needed inpt detox. Pt does not provide details as to who told him this. Of note pt was seen in ED late on 10-08-14 for pain and was requesting pain medication at that time.   Requested cart be placed with pt for assessment.   Assessment to commence shortly.    Clista Bernhardt, [facility] Triage Specialist 10/09/2014 2:32 AM

## 2014-10-10 ENCOUNTER — Encounter (HOSPITAL_COMMUNITY): Payer: Self-pay | Admitting: Psychiatry

## 2014-10-10 DIAGNOSIS — F1020 Alcohol dependence, uncomplicated: Secondary | ICD-10-CM | POA: Diagnosis present

## 2014-10-10 DIAGNOSIS — F1124 Opioid dependence with opioid-induced mood disorder: Secondary | ICD-10-CM

## 2014-10-10 DIAGNOSIS — F10230 Alcohol dependence with withdrawal, uncomplicated: Secondary | ICD-10-CM

## 2014-10-10 DIAGNOSIS — F39 Unspecified mood [affective] disorder: Secondary | ICD-10-CM

## 2014-10-10 DIAGNOSIS — F1120 Opioid dependence, uncomplicated: Secondary | ICD-10-CM | POA: Diagnosis present

## 2014-10-10 DIAGNOSIS — F1994 Other psychoactive substance use, unspecified with psychoactive substance-induced mood disorder: Secondary | ICD-10-CM | POA: Diagnosis present

## 2014-10-10 LAB — TSH: TSH: 0.921 u[IU]/mL (ref 0.350–4.500)

## 2014-10-10 LAB — LIPID PANEL
Cholesterol: 235 mg/dL — ABNORMAL HIGH (ref 0–200)
HDL: 50 mg/dL (ref >40)
LDL Cholesterol: 153 mg/dL — ABNORMAL HIGH (ref 0–99)
Total CHOL/HDL Ratio: 4.7 RATIO
Triglycerides: 159 mg/dL — ABNORMAL HIGH (ref <150)
VLDL: 32 mg/dL (ref 0–40)

## 2014-10-10 MED ORDER — DULOXETINE HCL 30 MG PO CPEP
30.0000 mg | ORAL_CAPSULE | Freq: Every day | ORAL | Status: DC
  Administered 2014-10-10 – 2014-10-11 (×2): 30 mg via ORAL
  Filled 2014-10-10 (×4): qty 1

## 2014-10-10 MED ORDER — GABAPENTIN 300 MG PO CAPS
300.0000 mg | ORAL_CAPSULE | Freq: Three times a day (TID) | ORAL | Status: DC
  Administered 2014-10-10 – 2014-10-13 (×10): 300 mg via ORAL
  Filled 2014-10-10 (×15): qty 1

## 2014-10-10 MED ORDER — BOOST / RESOURCE BREEZE PO LIQD
1.0000 | Freq: Three times a day (TID) | ORAL | Status: DC
  Administered 2014-10-10: 1 via ORAL
  Administered 2014-10-11: 22:00:00 via ORAL
  Administered 2014-10-12 – 2014-10-13 (×3): 1 via ORAL
  Filled 2014-10-10 (×15): qty 1

## 2014-10-10 NOTE — Progress Notes (Signed)
Patient ID: Alexander Pope, male   DOB: [birth year], 34 y.o.   MRN: [id number]  DAR: Pt. Denies SI/HI and A/V Hallucinations to this Clinical research associate. He reports that his withdrawal symptoms remain moderate. He remains isolative and is minimal and interaction but polite and appropriate. Patient does not appear to be attending groups. Support and encouragement provided to the patient. Scheduled medications administered to patient per physician's orders. Patient received PRN Zofran and Robaxin for withdrawal symptoms. He is able to express his needs and come to Clinical research associate with any questions or concerns. Q15 minute checks are maintained for safety.

## 2014-10-10 NOTE — Plan of Care (Signed)
Problem: Ineffective individual coping Goal: STG: Patient will remain free from self harm Outcome: Progressing Patient remains free from self harm.      

## 2014-10-10 NOTE — BHH Suicide Risk Assessment (Signed)
[facility] Admission Suicide Risk Assessment   Nursing information obtained from:  Patient Demographic factors:  Male, Caucasian, Access to firearms Current Mental Status:  Thoughts of violence towards others Loss Factors:  Loss of significant relationship, Legal issues Historical Factors:  Impulsivity, Family history of mental illness or substance abuse Risk Reduction Factors:  Responsible for children under 34 years of age, Employed Total Time spent with patient: 45 minutes Principal Problem: <principal problem not specified> Diagnosis:   Patient Active Problem List   Diagnosis Date Noted  . Alcohol dependence [F10.20] 10/10/2014  . Opioid dependence [F11.20] 10/10/2014  . Substance induced mood disorder [F19.94] 10/10/2014     Continued Clinical Symptoms:  Alcohol Use Disorder Identification Test Final Score (AUDIT): 15 The "Alcohol Use Disorders Identification Test", Guidelines for Use in Primary Care, Second Edition.  World Science writer [facility]). Score between 0-7:  no or low risk or alcohol related problems. Score between 8-15:  moderate risk of alcohol related problems. Score between 16-19:  high risk of alcohol related problems. Score 20 or above:  warrants further diagnostic evaluation for alcohol dependence and treatment.   CLINICAL FACTORS:   Depression:   Comorbid alcohol abuse/dependence Alcohol/Substance Abuse/Dependencies  Psychiatric Specialty Exam: Physical Exam  ROS  Blood pressure 143/96, pulse 63, temperature 97.5 F (36.4 C), temperature source Oral, resp. rate 20, height  (1.778 m), weight 85.957 kg (189 lb 8 oz), SpO2 99 %.Body mass index is 27.19 kg/(m^2).   COGNITIVE FEATURES THAT CONTRIBUTE TO RISK:  Closed-mindedness, Polarized thinking and Thought constriction (tunnel vision)    SUICIDE RISK:   Mild:  Suicidal ideation of limited frequency, intensity, duration, and specificity.  There are no identifiable plans, no associated intent, mild  dysphoria and related symptoms, good self-control (both objective and subjective assessment), few other risk factors, and identifiable protective factors, including available and accessible social support.  PLAN OF CARE: Supportive approach/coping skills                               Identify detox needs                               Reassess and address the co morbidities  Medical Decision Making:  Review of Psycho-Social Stressors (1), Review or order clinical lab tests (1) and Review of Medication Regimen & Side Effects (2)  I certify that inpatient services furnished can reasonably be expected to improve the patient's condition.   Skippy Marhefka A 10/10/2014, 6:24 PM

## 2014-10-10 NOTE — Progress Notes (Signed)
Pt was in bed sleeping at the time of group. 

## 2014-10-10 NOTE — BHH Counselor (Signed)
Adult Comprehensive Assessment  Patient ID: Alexander Pope, male   DOB: Nov 30, 1980, 34 y.o.   MRN: [id number]  Information Source: Information source: Patient  Current Stressors:  Physical health (include injuries & life threatening diseases): chronic back pain "I don't know what I did to it."  Bereavement / Loss: none identified   Living/Environment/Situation:  Living Arrangements: Children, Parent Living conditions (as described by patient or guardian): pt lives with his parents and two children--58 year old son and 34 year old daughter How long has patient lived in current situation?: about 2 months  What is atmosphere in current home: Chaotic, Temporary  Family History:  Marital status: Single Does patient have children?: Yes How many children?: 2 How is patient's relationship with their children?: 57 yo son and 31 yo daughter-they are being cared for by pt's mother. pt reports a good relationship with his kids.   Childhood History:  By whom was/is the patient raised?: Both parents Additional childhood history information: "it was a pretty normal childhood." pt reports that parents did not have s/a or MI issues.  Description of patient's relationship with caregiver when they were a child: close to both parents, mostly my mother Patient's description of current relationship with people who raised him/her: strained from father-"he's the reason I'm here. He yelled at me and called me a POS." My mom and I are close."  Does patient have siblings?: Yes Number of Siblings: 1 Description of patient's current relationship with siblings: younger sister-"we have a pretty good relatioship."  Did patient suffer any verbal/emotional/physical/sexual abuse as a child?: No Did patient suffer from severe childhood neglect?: No Has patient ever been sexually abused/assaulted/raped as an adolescent or adult?: No Was the patient ever a victim of a crime or a disaster?: No Witnessed domestic violence?:  No Has patient been effected by domestic violence as an adult?: No  Education:  Highest grade of school patient has completed: associates degree Currently a student?: No Name of school: n/a  Learning disability?: Yes What learning problems does patient have?: adhd-"I take adderral for it."   Employment/Work Situation:   Employment situation: Employed Where is patient currently employed?: 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  How long has patient been employed?: few months  Patient's job has been impacted by current illness: Yes Describe how patient's job has been impacted: pt reports strained relationship with his father which contributes to work performance  What is the longest time patient has a held a job?: n/a  Where was the patient employed at that time?: n/a  Has patient ever been in the [employer]?: No Has patient ever served in Buyer, retail?: No  Financial Resources:   Surveyor, quantity resources: Income from employment, Media planner, Support from parents / caregiver Does patient have a representative payee or guardian?: No  Alcohol/Substance Abuse:   If attempted suicide, did drugs/alcohol play a role in this?: No Alcohol/Substance Abuse Treatment Hx: Past Tx, Inpatient, Past Tx, Outpatient, Past detox If yes, describe treatment: Pt reports that he recently went to [facility] and stayed for about a month-relapsed about 2 weeks after discharge. Pt goes to the [facility] for SAIOP currenlty.  Has alcohol/substance abuse ever caused legal problems?: Yes (DUI upcoming court date next month)  Social Support System:   Patient's Community Support System: Fair Museum/gallery exhibitions officer System: some friends. mom and sister are supportive Type of faith/religion: n/a  How does patient's faith help to cope with  current illness?: n/a   Leisure/Recreation:   Leisure and Hobbies: spending time with his kids  Strengths/Needs:   What things  does the patient do well?: intelligent, motivated to get sober, "good father and hard worker.' In what areas does patient struggle / problems for patient: insight; coping skills. alcoholism-minimizing usage of alcohol.   Discharge Plan:   Does patient have access to transportation?: Yes Will patient be returning to same living situation after discharge?: No Plan for living situation after discharge: pt unsure if he will return home or find another place to live. Pt does not want further inpatient treatment at this time.  Currently receiving community mental health services: Yes (From Whom) ([facility] SAIOP) If no, would patient like referral for services when discharged?: Yes (What county[place] county) Does patient have financial barriers related to discharge medications?: No (private insurance and income)  Summary/Recommendations:    Pt is 34 year old male living in [place], [place] with his mother,father and his two children. Pt presents to [facility] due to depression, med stabilization, and for alcohol detox. Pt reports getting into verbal altercation with his father the other evening which resulted in pt calling 911. Pt reports that he has been drinking 'about a 6pack per day' for the past month. He reports relapsing on alcohol about 2 weeks after completing detox program in [place], [place]. Pt denies SI/HI/AVH and plans to return to [facility] for SAIOP. He is not interested in other referrals at this time. Pt unsure if he will return to his parents' home or if he will find somewhere else to live at d/c. Recommendations for pt include: crisis stabilization, therapeutic milieu, encourage group attendance and participation, librium/clonidine taper for withdrawals, medication management for mood stabilization, and development of comprehensive mental wellness/sobriety plan. CSW assessing.   Trula Slade [facility] 10/10/2014 12:56 PM

## 2014-10-10 NOTE — BHH Suicide Risk Assessment (Signed)
BHH INPATIENT:  Family/Significant Other Suicide Prevention Education  Suicide Prevention Education:  Patient Refusal for Family/Significant Other Suicide Prevention Education: The patient Alexander Pope has refused to provide written consent for family/significant other to be provided Family/Significant Other Suicide Prevention Education during admission and/or prior to discharge.  Physician notified.  SPE completed with pt, as pt refused to consent to family contact. SPI pamphlet provided to pt and pt was encouraged to share information with support network, ask questions, and talk about any concerns relating to SPE. Pt denies access to guns/firearms and verbalized understanding of information provided. Mobile Crisis information also provided to pt.   Smart, Azrael Huss LCSWA 10/10/2014, 12:44 PM

## 2014-10-10 NOTE — Progress Notes (Signed)
NUTRITION ASSESSMENT  Pt identified as at risk on the Malnutrition Screen Tool  INTERVENTION: 1. Supplements: Boost Breeze po TID, each supplement provides 250 kcal and 9 grams of protein  NUTRITION DIAGNOSIS: Unintentional weight loss related to sub-optimal intake as evidenced by pt report.   Goal: Pt to meet >/= 90% of their estimated nutrition needs.  Monitor:  PO intake  Assessment:  Pt admitted with HI. Pt experiencing withdrawal symptoms d/t drug use.  Pt with  16 lb weight loss since December 2015. Insignificant for time frame.  Pt would still benefit from nutritional supplementation during admission. RD to order.  Height: Ht Readings from Last 1 Encounters:  10/09/14  (1.778 m)    Weight: Wt Readings from Last 1 Encounters:  10/09/14 189 lb 8 oz (85.957 kg)    Weight Hx: Wt Readings from Last 10 Encounters:  10/09/14 189 lb 8 oz (85.957 kg)  10/08/14 190 lb (86.183 kg)  01/12/14 205 lb (92.987 kg)  01/14/11 200 lb (90.719 kg)    BMI:  Body mass index is 27.19 kg/(m^2). Pt meets criteria for overweight based on current BMI.  Estimated Nutritional Needs: Kcal: 25-30 kcal/kg Protein: > 1 gram protein/kg Fluid: 1 ml/kcal  Diet Order: Diet regular Room service appropriate?: Yes; Fluid consistency:: Thin Pt is also offered choice of unit snacks mid-morning and mid-afternoon.  Pt is eating as desired.   Lab results and medications reviewed.   Tilda Franco, MS, RD, LDN Pager: [phone] After Hours Pager: [phone]

## 2014-10-10 NOTE — Progress Notes (Signed)
Recreation Therapy Notes   Animal-Assisted Activity (AAA) Program Checklist/Progress Notes Patient Eligibility Criteria Checklist & Daily Group note for Rec Tx Intervention  Date: 08.30.2016 Time: 2:45am Location: 400 Hall Dayroom    AAA/T Program Assumption of Risk Form signed by Patient/ or Parent Legal Guardian yes  Patient is free of allergies or sever asthma yes  Patient reports no fear of animals yes  Patient reports no history of cruelty to animals yes  Patient understands his/her participation is voluntary yes  Patient washes hands before animal contact yes  Patient washes hands after animal contact yes  Behavioral Response: Did not attend.   Kaleyah Labreck L Julieta Rogalski, LRT/CTRS  Prayan Ulin L 10/10/2014 3:05 PM 

## 2014-10-10 NOTE — Tx Team (Signed)
Interdisciplinary Treatment Plan Update (Adult)  Date:  10/10/2014  Time Reviewed:  9:19 AM   Progress in Treatment: Attending groups: Yes. Participating in groups:  Yes. Taking medication as prescribed:  Yes. Tolerating medication:  Yes. Family/Significant othe contact made:  Not yet. SPE required for this pt.  Patient understands diagnosis:  Yes. and As evidenced by:  seeking treatment for: alcohol detox, mood stabilization, depression.  Discussing patient identified problems/goals with staff:  Yes. Medical problems stabilized or resolved:  Yes. Denies suicidal/homicidal ideation: Yes. Issues/concerns per patient self-inventory:  Other  Discharge Plan or Barriers: CSW assessing for appropriate referrals. Pt reports that he lives with his parents and his two children, cared for by his mother.   Reason for Continuation of Hospitalization: Depression Medication stabilization Withdrawals  Comments:  Alexander Pope is an 34 y.o. male. Presenting to ED after verbal conflict with father requesting inpt detox. Pt reports he was working on his mother's car all day and then his dad came home and began yelling at him, calling him names and saying he needed to be committed. Pt came to ED requesting detox and complaining of back pain, he was given OP resources and discharged. Pt began agitated and was adamant that he needed to be admitted somewhere. Pt denies SI, HI, and AVH. He reports he self injures out of boredom at times, putting cigarettes out on his arms. Pt reports 2 past suicide attempts 3-5 years ago but reports he can not recall what led to him doing that. Pt reports he has a lot of stressors. He reports he lives with his parents and his two children, and his parents care for the children. "I am basically just there." Pt reports his children's mother is in jail. Pt has a pending DUI charge, and conflict with his father. Pt reports he has no friends, and no place to go, he is requesting inpt  detox. Pt reports he has been feeling depressed for about two weeks. He endorses crying spells, irritability, loss of pleasure, trouble maintaining motivation, decreased grooming and self care, loss of appetite and trouble initiating and maintaining sleep. Pt reports agitated, high energy states, "where I feel like I could run through that wall" lasting 1-2 hours. Pt reports panic attacks about once a week, triggered by thinking about his faults. Reports he worries about "anything" and "everything" all of the time, and that this worry interferes with functioning. Denies hx of abuse or neglect, denies trauma hx. Pt reports he began drinking heavily in his mid twenties, drinking 12-18 beers for the last several years. He recently went to rehab in [facility], and relapsed about a week after his release. He has been drinking about a 6 pack a day for the last two months, and has a DUI pending. Pt reports he was prescribed pain pills many years ago for injury and began to abuse them. He reports his last taper dose of suboxone was 10-02-14, and he feels this may be why his mood has been so irritable lately. Pt uses THC infrequently, and has tried cocaine and heroin several times. Family hx is positive for etoh abuse. Pt denies hx of MH and SI concerns. Pt is able to contract for safety, and is requesting inpt detox.    Estimated length of stay:  3-5 days   New goal(s): to formulate effective aftercare plan.   Additional Comments:  Patient and CSW reviewed pt's identified goals and treatment plan. Patient verbalized understanding and agreed to treatment plan.  CSW reviewed [facility] "Discharge Process and Patient Involvement" Form. Pt verbalized understanding of information provided and signed form.    Review of initial/current patient goals per problem list:  1. Goal(s): Patient will participate in aftercare plan  Met: No.   Target date: at discharge  As evidenced by: Patient will participate within aftercare  plan AEB aftercare provider and housing plan at discharge being identified.  8/30: CSW assessing for appropriate referrals at this time.   2. Goal (s): Patient will exhibit decreased depressive symptoms and suicidal ideations.  Met: No.    Target date: at discharge  As evidenced by: Patient will utilize self rating of depression at 3 or below and demonstrate decreased signs of depression or be deemed stable for discharge by MD.  8/30: Pt rates depression as high today with no SI/HI/AVH. Some agitation.   4. Goal(s): Patient will demonstrate decreased signs of withdrawal due to substance abuse  Met:No.   Target date:at discharge   As evidenced by: Patient will produce a CIWA/COWS score of 0, have stable vitals signs, and no symptoms of withdrawal.  8/30: Pt reports moderate withdrawals with CIWA score of 5 and high standing/sitting BP.    Attendees: Patient:   10/10/2014 9:19 AM   Family:   10/10/2014 9:19 AM   Physician:  Dr. Carlton Larri, MD 10/10/2014 9:19 AM   Nursing:   Alexander Pope; Alexander Pope 10/10/2014 9:19 AM   Clinical Social Worker: Alexander Pope, Alexander Pope  10/10/2014 9:19 AM   Clinical Social Worker: Alexander Pope; Alexander Pope 10/10/2014 9:19 AM   Other:  Alexander Pope 10/10/2014 9:19 AM   Other:  Alexander Pope; Alexander Pope  10/10/2014 9:19 AM   Other:   10/10/2014 9:19 AM   Other:  10/10/2014 9:19 AM   Other:  10/10/2014 9:19 AM   Other:  10/10/2014 9:19 AM    10/10/2014 9:19 AM    10/10/2014 9:19 AM    10/10/2014 9:19 AM    10/10/2014 9:19 AM    Alexander Pope:   Alexander Pope  10/10/2014 9:19 AM

## 2014-10-10 NOTE — BHH Group Notes (Signed)
BHH Group Notes:  (Nursing/MHT/Case Management/Adjunct)  Date:  10/10/2014  Time:  0900  Type of Therapy:  Nurse Education  Participation Level:  Did Not Attend  Participation Quality:    Affect:    Cognitive:    Insight:    Engagement in Group:    Modes of Intervention:    Summary of Progress/Problems: Patient attended, was attentive and shared in group discussion regarding successful tools for recovery.      Alexander Pope [facility] 10/10/2014, 0930

## 2014-10-10 NOTE — H&P (Signed)
Psychiatric Admission Assessment Adult  Patient Identification: Alexander Pope MRN:  [id number] Date of Evaluation:  10/10/2014 Chief Complaint:  SUBSTANCE INDUCED MOOD DISORDER Principal Diagnosis: <principal problem not specified> Diagnosis:   Patient Active Problem List   Diagnosis Date Noted  . Homicidal ideations [R45.850] 10/09/2014   History of Present Illness:: 34 Y/o male who states that at end of May he went to treatment in [place]. After he came out he went to stay with his parents. States he was washing his car, drinking a a beer or two. States that father got home and they got in an argument. Has been going to the [facility]. States he drinks 6-8 beers a day. States he was using opioids when he went to the residential treatment center. In the [facility] he was given a quick opioid  Detox with Suboxone.. Came off in four days down to 4 mg of Suboxone and then none. He took the last Suboxone last 7 days ago. He is not sure if he wants to stop drinking altogether.   The initial assessment is as follows: Alexander Pope is an 34 y.o. male. Presenting to ED after verbal conflict with father requesting inpt detox. Pt reports he was working on his mother's car all day and then his dad came home and began yelling at him, calling him names and saying he needed to be committed. Pt came to ED requesting detox and complaining of back pain, he was given OP resources and discharged. Pt began agitated and was adamant that he needed to be admitted somewhere. At the time of assessment pt was irritable and depressed with labile affect. He was oriented times 4, with partial judgement. Pt denies SI, HI, and AVH. He reports he self injures out of boredom at times, putting cigarettes out on his arms. Pt reports 2 past suicide attempts 3-5 years ago but reports he can not recall what led to him doing that. Pt reports he has a lot of stressors. He reports he lives with his parents and his two children,  and his parents care for the children. "I am basically just there." Pt reports his children's mother is in jail. Pt has a pending DUI charge, and conflict with his father. Pt reports he has no friends, and no place to go, he is requesting inpt detox.   Pt reports he has been feeling depressed for about two weeks. He endorses crying spells, irritability, loss of pleasure, trouble maintaining motivation, decreased grooming and self care, loss of appetite and trouble initiating and maintaining sleep. Pt reports agitated, high energy states, "where I feel like I could run through that wall" lasting 1-2 hours.   Pt reports panic attacks about once a week, triggered by thinking about his faults. Reports he worries about "anything" and "everything" all of the time, and that this worry interferes with functioning. Denies hx of abuse or neglect, denies trauma hx.   Pt reports he began drinking heavily in his mid twenties, drinking 12-18 beers for the last several years. He recently went to rehab in [facility], and relapsed about a week after his release. He has been drinking about a 6 pack a day for the last two months, and has a DUI pending. Pt reports he was prescribed pain pills many years ago for injury and began to abuse them. He reports his last taper dose of suboxone was 10-02-14, and he feels this may be why his mood has been so irritable lately. Pt  uses THC infrequently, and has tried cocaine and heroin several times.   Elements:  Location:  alcohol opioid dependence depression anxiety. Quality:  relapsed on alcohol has been more anxious depressed got into argument with his father due to his relapse . Severity:  moderate. Timing:  every day. Duration:  last seven days. Context:  alcohol opioid dependence on going use of alcohol went trough Suboxone detox last seven days off the Suboxone more depressed anxious. Associated Signs/Symptoms: Depression Symptoms:  depressed  mood, anhedonia, insomnia, fatigue, anxiety, panic attacks, loss of energy/fatigue, disturbed sleep, (Hypo) Manic Symptoms:  Irritable Mood, Labiality of Mood, Anxiety Symptoms:  Excessive Worry, Panic Symptoms, Psychotic Symptoms:  denies PTSD Symptoms: Negative Total Time spent with patient: 45 minutes  Past Medical History:  Past Medical History  Diagnosis Date  . Hypertension   . Depression   . Anxiety    History reviewed. No pertinent past surgical history.  Back pain  Family History: History reviewed. No pertinent family history.  Denies family history Social History:  History  Alcohol Use  . 3.6 oz/week  . 6 Cans of beer per week    Comment: daily     History  Drug Use  . Yes  . Special: Cocaine, Benzodiazepines    Comment: "Molly"    Social History   Social History  . Marital Status: Single    Spouse Name: N/A  . Number of Children: N/A  . Years of Education: N/A   Social History Main Topics  . Smoking status: Current Every Day Smoker -- 1.00 packs/day    Types: Cigarettes  . Smokeless tobacco: None  . Alcohol Use: 3.6 oz/week    6 Cans of beer per week     Comment: daily  . Drug Use: Yes    Special: Cocaine, Benzodiazepines     Comment: "Molly"  . Sexual Activity: Not Currently   Other Topics Concern  . None   Social History Narrative  lives with parents and his 2 kids 63, 71. Mother is in prison braking and entering. 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  Additional Social History:                          Musculoskeletal: Strength & Muscle Tone: within normal limits Gait & Station: normal Patient leans: normal  Psychiatric Specialty Exam: Physical Exam  Review of Systems  Constitutional: Positive for malaise/fatigue.  Eyes: Negative.   Respiratory:       Pack a day  Cardiovascular: Negative.   Gastrointestinal: Positive for nausea, abdominal pain and diarrhea.   Genitourinary: Negative.   Musculoskeletal: Positive for back pain and joint pain.  Skin: Negative.   Neurological: Positive for dizziness, weakness and headaches.  Endo/Heme/Allergies: Negative.   Psychiatric/Behavioral: Positive for depression and substance abuse. The patient is nervous/anxious and has insomnia.     Blood pressure 144/98, pulse 93, temperature 97.5 F (36.4 C), temperature source Oral, resp. rate 20, height '5\' 10"'  (1.778 m), weight 85.957 kg (189 lb 8 oz), SpO2 99 %.Body mass index is 27.19 kg/(m^2).  General Appearance: Fairly Groomed  Engineer, water::  Fair  Speech:  Clear and Coherent  Volume:  fluctuates  Mood:  Anxious, Depressed and Irritable  Affect:  anxious depressed worried in pain  Thought Process:  Coherent and Goal Directed  Orientation:  Full (Time, Place, and Person)  Thought Content:  symptoms events worries concerns  Suicidal Thoughts:  No  Homicidal Thoughts:  No  Memory:  Immediate;   Fair Recent;   Fair Remote;   Fair  Judgement:  Fair  Insight:  Present and Shallow  Psychomotor Activity:  Restlessness  Concentration:  Fair  Recall:  [employer] of Knowledge:Fair  Language: Fair  Akathisia:  No  Handed:  Right  AIMS (if indicated):     Assets:  Desire for Improvement Housing Social Support  ADL's:  Intact  Cognition: WNL  Sleep:  Number of Hours: 6.75   Risk to Self: Is patient at risk for suicide?: No Risk to Others:   Prior Inpatient Therapy:  [facility]  Prior Outpatient Therapy:  [facility] [place]  Alcohol Screening: 1. How often do you have a drink containing alcohol?: 4 or more times a week 2. How many drinks containing alcohol do you have on a typical day when you are drinking?: 5 or 6 3. How often do you have six or more drinks on one occasion?: Daily or almost daily Preliminary Score: 6 4. How often during the last year have you found that you were not able to stop drinking once you had started?: Never (pt.  denies) 5. How often during the last year have you failed to do what was normally expected from you becasue of drinking?: Never (pt. denies) 6. How often during the last year have you needed a first drink in the morning to get yourself going after a heavy drinking session?: Never (pt, denies) 7. How often during the last year have you had a feeling of guilt of remorse after drinking?: Less than monthly 8. How often during the last year have you been unable to remember what happened the night before because you had been drinking?: Never 9. Have you or someone else been injured as a result of your drinking?: No 10. Has a relative or friend or a doctor or another health worker been concerned about your drinking or suggested you cut down?: Yes, during the last year Alcohol Use Disorder Identification Test Final Score (AUDIT): 15 Brief Intervention: Yes  Allergies:  No Known Allergies Lab Results:  Results for orders placed or performed during the hospital encounter of 10/09/14 (from the past 48 hour(s))  Comprehensive metabolic panel     Status: Abnormal   Collection Time: 10/09/14  2:37 AM  Result Value Ref Range   Sodium 138 135 - 145 mmol/L   Potassium 3.9 3.5 - 5.1 mmol/L   Chloride 106 101 - 111 mmol/L   CO2 23 22 - 32 mmol/L   Glucose, Bld 77 65 - 99 mg/dL   BUN 15 6 - 20 mg/dL   Creatinine, Ser 1.08 0.61 - 1.24 mg/dL   Calcium 8.6 (L) 8.9 - 10.3 mg/dL   Total Protein 7.6 6.5 - 8.1 g/dL   Albumin 4.6 3.5 - 5.0 g/dL   AST 29 15 - 41 U/L   ALT 16 (L) 17 - 63 U/L   Alkaline Phosphatase 59 38 - 126 U/L   Total Bilirubin 0.6 0.3 - 1.2 mg/dL   GFR calc non Af Amer >60 >60 mL/min   GFR calc Af Amer >60 >60 mL/min    Comment: (NOTE) The eGFR has been calculated using the CKD EPI equation. This calculation has not been validated in all clinical situations. eGFR's persistently <60 mL/min signify possible Chronic Kidney Disease.    Anion gap 9 5 - 15  CBC with Differential     Status:  Abnormal   Collection Time:  10/09/14  2:37 AM  Result Value Ref Range   WBC 11.2 (H) 4.0 - 10.5 K/uL   RBC 4.73 4.22 - 5.81 MIL/uL   Hemoglobin 15.4 13.0 - 17.0 g/dL   HCT 42.7 39.0 - 52.0 %   MCV 90.3 78.0 - 100.0 fL   MCH 32.6 26.0 - 34.0 pg   MCHC 36.1 (H) 30.0 - 36.0 g/dL   RDW 12.9 11.5 - 15.5 %   Platelets 177 150 - 400 K/uL   Neutrophils Relative % 68 43 - 77 %   Neutro Abs 7.6 1.7 - 7.7 K/uL   Lymphocytes Relative 25 12 - 46 %   Lymphs Abs 2.8 0.7 - 4.0 K/uL   Monocytes Relative 5 3 - 12 %   Monocytes Absolute 0.6 0.1 - 1.0 K/uL   Eosinophils Relative 2 0 - 5 %   Eosinophils Absolute 0.2 0.0 - 0.7 K/uL   Basophils Relative 0 0 - 1 %   Basophils Absolute 0.1 0.0 - 0.1 K/uL  Ethanol     Status: Abnormal   Collection Time: 10/09/14  2:37 AM  Result Value Ref Range   Alcohol, Ethyl (B) 124 (H) <5 mg/dL    Comment:        LOWEST DETECTABLE LIMIT FOR SERUM ALCOHOL IS 5 mg/dL FOR MEDICAL PURPOSES ONLY   Urine rapid drug screen (hosp performed)     Status: Abnormal   Collection Time: 10/09/14 10:30 AM  Result Value Ref Range   Opiates NONE DETECTED NONE DETECTED   Cocaine NONE DETECTED NONE DETECTED   Benzodiazepines POSITIVE (A) NONE DETECTED   Amphetamines POSITIVE (A) NONE DETECTED   Tetrahydrocannabinol NONE DETECTED NONE DETECTED   Barbiturates NONE DETECTED NONE DETECTED    Comment:        DRUG SCREEN FOR MEDICAL PURPOSES ONLY.  IF CONFIRMATION IS NEEDED FOR ANY PURPOSE, NOTIFY LAB WITHIN 5 DAYS.        LOWEST DETECTABLE LIMITS FOR URINE DRUG SCREEN Drug Class       Cutoff (ng/mL) Amphetamine      1000 Barbiturate      200 Benzodiazepine   295 Tricyclics       621 Opiates          300 Cocaine          300 THC              50    Current Medications: Current Facility-Administered Medications  Medication Dose Route Frequency Provider Last Rate Last Dose  . acetaminophen (TYLENOL) tablet 650 mg  650 mg Oral Q6H PRN Encarnacion Slates, NP      . alum & mag  hydroxide-simeth (MAALOX/MYLANTA) 200-200-20 MG/5ML suspension 30 mL  30 mL Oral Q4H PRN Encarnacion Slates, NP      . chlordiazePOXIDE (LIBRIUM) capsule 25 mg  25 mg Oral Q6H PRN Encarnacion Slates, NP      . chlordiazePOXIDE (LIBRIUM) capsule 25 mg  25 mg Oral QID Encarnacion Slates, NP   25 mg at 10/10/14 0805   Followed by  . [START ON 10/11/2014] chlordiazePOXIDE (LIBRIUM) capsule 25 mg  25 mg Oral TID Encarnacion Slates, NP       Followed by  . [START ON 10/12/2014] chlordiazePOXIDE (LIBRIUM) capsule 25 mg  25 mg Oral BH-qamhs Encarnacion Slates, NP       Followed by  . [START ON 10/13/2014] chlordiazePOXIDE (LIBRIUM) capsule 25 mg  25 mg Oral Daily Encarnacion Slates, NP      .  cloNIDine (CATAPRES) tablet 0.1 mg  0.1 mg Oral QID Encarnacion Slates, NP   0.1 mg at 10/10/14 0805   Followed by  . [START ON 10/12/2014] cloNIDine (CATAPRES) tablet 0.1 mg  0.1 mg Oral BH-qamhs Encarnacion Slates, NP       Followed by  . [START ON 10/14/2014] cloNIDine (CATAPRES) tablet 0.1 mg  0.1 mg Oral QAC breakfast Encarnacion Slates, NP      . dicyclomine (BENTYL) tablet 20 mg  20 mg Oral Q6H PRN Encarnacion Slates, NP      . hydrochlorothiazide (MICROZIDE) capsule 12.5 mg  12.5 mg Oral Daily Encarnacion Slates, NP   12.5 mg at 10/10/14 0805  . hydrOXYzine (ATARAX/VISTARIL) tablet 25 mg  25 mg Oral Q6H PRN Encarnacion Slates, NP      . loperamide (IMODIUM) capsule 2-4 mg  2-4 mg Oral PRN Encarnacion Slates, NP      . magnesium hydroxide (MILK OF MAGNESIA) suspension 30 mL  30 mL Oral Daily PRN Encarnacion Slates, NP      . methocarbamol (ROBAXIN) tablet 500 mg  500 mg Oral Q8H PRN Encarnacion Slates, NP   500 mg at 10/10/14 7353  . metoprolol tartrate (LOPRESSOR) tablet 25 mg  25 mg Oral BID Encarnacion Slates, NP   25 mg at 10/10/14 0805  . multivitamin with minerals tablet 1 tablet  1 tablet Oral Daily Encarnacion Slates, NP   1 tablet at 10/10/14 0805  . naproxen (NAPROSYN) tablet 500 mg  500 mg Oral BID PRN Encarnacion Slates, NP   500 mg at 10/09/14 2116  . nicotine (NICODERM CQ - dosed in mg/24  hours) patch 21 mg  21 mg Transdermal Q0600 Encarnacion Slates, NP   21 mg at 10/10/14 0806  . ondansetron (ZOFRAN-ODT) disintegrating tablet 4 mg  4 mg Oral Q6H PRN Encarnacion Slates, NP   4 mg at 10/10/14 0810  . thiamine (VITAMIN B-1) tablet 100 mg  100 mg Oral Daily Encarnacion Slates, NP   100 mg at 10/10/14 0805  . traZODone (DESYREL) tablet 50 mg  50 mg Oral QHS PRN Encarnacion Slates, NP   50 mg at 10/09/14 2116   PTA Medications: Prescriptions prior to admission  Medication Sig Dispense Refill Last Dose  . amphetamine-dextroamphetamine (ADDERALL) 30 MG tablet Take 30 mg by mouth 2 (two) times daily.   0 Past Week at Unknown time  . ibuprofen (ADVIL,MOTRIN) 200 MG tablet Take 800 mg by mouth every 6 (six) hours as needed for moderate pain.   unknown    Previous Psychotropic Medications: Yes Klonopin for anxiety. Zoloft Cymbalta Wellbutrin Neurontin Seroquel Abilify Adderall  Substance Abuse History in the last 12 months:  Yes.      Consequences of Substance Abuse: Legal Consequences:  DWI Withdrawal Symptoms:   Diaphoresis Diarrhea Headaches Nausea Tremors  Results for orders placed or performed during the hospital encounter of 10/09/14 (from the past 72 hour(s))  Comprehensive metabolic panel     Status: Abnormal   Collection Time: 10/09/14  2:37 AM  Result Value Ref Range   Sodium 138 135 - 145 mmol/L   Potassium 3.9 3.5 - 5.1 mmol/L   Chloride 106 101 - 111 mmol/L   CO2 23 22 - 32 mmol/L   Glucose, Bld 77 65 - 99 mg/dL   BUN 15 6 - 20 mg/dL   Creatinine, Ser 1.08 0.61 - 1.24 mg/dL   Calcium 8.6 (  L) 8.9 - 10.3 mg/dL   Total Protein 7.6 6.5 - 8.1 g/dL   Albumin 4.6 3.5 - 5.0 g/dL   AST 29 15 - 41 U/L   ALT 16 (L) 17 - 63 U/L   Alkaline Phosphatase 59 38 - 126 U/L   Total Bilirubin 0.6 0.3 - 1.2 mg/dL   GFR calc non Af Amer >60 >60 mL/min   GFR calc Af Amer >60 >60 mL/min    Comment: (NOTE) The eGFR has been calculated using the CKD EPI equation. This calculation has not been  validated in all clinical situations. eGFR's persistently <60 mL/min signify possible Chronic Kidney Disease.    Anion gap 9 5 - 15  CBC with Differential     Status: Abnormal   Collection Time: 10/09/14  2:37 AM  Result Value Ref Range   WBC 11.2 (H) 4.0 - 10.5 K/uL   RBC 4.73 4.22 - 5.81 MIL/uL   Hemoglobin 15.4 13.0 - 17.0 g/dL   HCT 42.7 39.0 - 52.0 %   MCV 90.3 78.0 - 100.0 fL   MCH 32.6 26.0 - 34.0 pg   MCHC 36.1 (H) 30.0 - 36.0 g/dL   RDW 12.9 11.5 - 15.5 %   Platelets 177 150 - 400 K/uL   Neutrophils Relative % 68 43 - 77 %   Neutro Abs 7.6 1.7 - 7.7 K/uL   Lymphocytes Relative 25 12 - 46 %   Lymphs Abs 2.8 0.7 - 4.0 K/uL   Monocytes Relative 5 3 - 12 %   Monocytes Absolute 0.6 0.1 - 1.0 K/uL   Eosinophils Relative 2 0 - 5 %   Eosinophils Absolute 0.2 0.0 - 0.7 K/uL   Basophils Relative 0 0 - 1 %   Basophils Absolute 0.1 0.0 - 0.1 K/uL  Ethanol     Status: Abnormal   Collection Time: 10/09/14  2:37 AM  Result Value Ref Range   Alcohol, Ethyl (B) 124 (H) <5 mg/dL    Comment:        LOWEST DETECTABLE LIMIT FOR SERUM ALCOHOL IS 5 mg/dL FOR MEDICAL PURPOSES ONLY   Urine rapid drug screen (hosp performed)     Status: Abnormal   Collection Time: 10/09/14 10:30 AM  Result Value Ref Range   Opiates NONE DETECTED NONE DETECTED   Cocaine NONE DETECTED NONE DETECTED   Benzodiazepines POSITIVE (A) NONE DETECTED   Amphetamines POSITIVE (A) NONE DETECTED   Tetrahydrocannabinol NONE DETECTED NONE DETECTED   Barbiturates NONE DETECTED NONE DETECTED    Comment:        DRUG SCREEN FOR MEDICAL PURPOSES ONLY.  IF CONFIRMATION IS NEEDED FOR ANY PURPOSE, NOTIFY LAB WITHIN 5 DAYS.        LOWEST DETECTABLE LIMITS FOR URINE DRUG SCREEN Drug Class       Cutoff (ng/mL) Amphetamine      1000 Barbiturate      200 Benzodiazepine   161 Tricyclics       096 Opiates          300 Cocaine          300 THC              50     Observation Level/Precautions:  15 minute checks   Laboratory:  As per the ED  Psychotherapy:  Individual/group  Medications:  Librium detox/clonidine detox  Consultations:    Discharge Concerns:    Estimated LOS: 3-5 days  Other:     Psychological Evaluations: No  Treatment Plan Summary: Daily contact with patient to assess and evaluate symptoms and progress in treatment and Medication management Supportive approach/coping skills Alcohol dependence; Librium detox protocol/relapse prevention plan Opioid dependence; detox with clonidine Mood instability; reassess for the need for psychotropic agents Explore residential treatemen options Medical Decision Making:  Review of Psycho-Social Stressors (1), Review or order clinical lab tests (1), Review of Medication Regimen & Side Effects (2) and Review of New Medication or Change in Dosage (2)  I certify that inpatient services furnished can reasonably be expected to improve the patient's condition.   Alexander Pope A 8/30/20168:13 AM

## 2014-10-10 NOTE — [facility] Group Notes (Signed)
[facility] LCSW Group Therapy  10/10/2014 1:27 PM  Type of Therapy:  Group Therapy  Participation Level:  Did Not Attend-pt chose no to attend/remained in bed.  Summary of Progress/Problems: MHA Speaker came to talk about his personal journey with substance abuse and addiction. The pt processed ways by which to relate to the speaker. MHA speaker provided handouts and educational information pertaining to groups and services offered by the [facility].   Pope, Alexander Reichardt LCSWA 10/10/2014, 1:27 PM

## 2014-10-11 MED ORDER — DULOXETINE HCL 60 MG PO CPEP
60.0000 mg | ORAL_CAPSULE | Freq: Every day | ORAL | Status: DC
  Administered 2014-10-12 – 2014-10-13 (×2): 60 mg via ORAL
  Filled 2014-10-11 (×5): qty 1

## 2014-10-11 NOTE — Plan of Care (Signed)
Problem: Alteration in mood & ability to function due to Goal: STG-Patient will report withdrawal symptoms Outcome: Progressing Patient able to report withdrawal symptoms to Clinical research associate.

## 2014-10-11 NOTE — Progress Notes (Signed)
[facility] MD Progress Note  10/11/2014 6:10 PM Alexander Pope  MRN:  [id number] Subjective:  Alexander Pope is unsure of what to do. He spoke with his mother. States that his parents house is not a safe place as his father keeps a lot of beer. He is struggling with his back pain. States that the pain is what led him to the opioids and subsequently abusing them. They have offered surgery but he is worried about complications. He would still like to look into using Vivitrol. States it could help him address the alcohol and the opiods Principal Problem: <principal problem not specified> Diagnosis:   Patient Active Problem List   Diagnosis Date Noted  . Alcohol dependence [F10.20] 10/10/2014  . Opioid dependence [F11.20] 10/10/2014  . Substance induced mood disorder [F19.94] 10/10/2014   Total Time spent with patient: 30 minutes   Past Medical History:  Past Medical History  Diagnosis Date  . Hypertension   . Depression   . Anxiety    History reviewed. No pertinent past surgical history. Family History: History reviewed. No pertinent family history. Social History:  History  Alcohol Use  . 3.6 oz/week  . 6 Cans of beer per week    Comment: daily     History  Drug Use  . Yes  . Special: Cocaine, Benzodiazepines    Comment: "Molly"    Social History   Social History  . Marital Status: Single    Spouse Name: N/A  . Number of Children: N/A  . Years of Education: N/A   Social History Main Topics  . Smoking status: Current Every Day Smoker -- 1.00 packs/day    Types: Cigarettes  . Smokeless tobacco: None  . Alcohol Use: 3.6 oz/week    6 Cans of beer per week     Comment: daily  . Drug Use: Yes    Special: Cocaine, Benzodiazepines     Comment: "Molly"  . Sexual Activity: Not Currently   Other Topics Concern  . None   Social History Narrative   Additional History:    Sleep: Fair  Appetite:  Fair   Assessment:   Musculoskeletal: Strength & Muscle Tone: within normal  limits Gait & Station: normal Patient leans: normal   Psychiatric Specialty Exam: Physical Exam  Review of Systems  Constitutional: Positive for malaise/fatigue.  HENT: Negative.   Eyes: Negative.   Respiratory: Negative.   Cardiovascular: Negative.   Gastrointestinal: Negative.   Genitourinary: Negative.   Musculoskeletal: Negative.   Skin: Negative.   Neurological: Positive for weakness.  Endo/Heme/Allergies: Negative.   Psychiatric/Behavioral: Positive for depression and substance abuse. The patient is nervous/anxious.     Blood pressure 113/69, pulse 70, temperature 97.8 F (36.6 C), temperature source Oral, resp. rate 18, height 5\' 10"  (1.778 m), weight 85.957 kg (189 lb 8 oz), SpO2 99 %.Body mass index is 27.19 kg/(m^2).  General Appearance: Fairly Groomed  Patent attorney::  Fair  Speech:  Clear and Coherent  Volume:  Decreased  Mood:  Anxious and Depressed  Affect:  Restricted and in pain  Thought Process:  Coherent and Goal Directed  Orientation:  Full (Time, Place, and Person)  Thought Content:  symptms events worries concerns  Suicidal Thoughts:  No  Homicidal Thoughts:  No  Memory:  Immediate;   Fair Recent;   Fair Remote;   Fair  Judgement:  Fair  Insight:  Present  Psychomotor Activity:  Normal  Concentration:  Fair  Recall:  [employer] of Knowledge:Fair  Language:  Fair  Akathisia:  No  Handed:  Right  AIMS (if indicated):     Assets:  Desire for Improvement  ADL's:  Intact  Cognition: WNL  Sleep:  Number of Hours: 6.75     Current Medications: Current Facility-Administered Medications  Medication Dose Route Frequency Provider Last Rate Last Dose  . acetaminophen (TYLENOL) tablet 650 mg  650 mg Oral Q6H PRN Sanjuana Kava, NP      . alum & mag hydroxide-simeth (MAALOX/MYLANTA) 200-200-20 MG/5ML suspension 30 mL  30 mL Oral Q4H PRN Sanjuana Kava, NP      . chlordiazePOXIDE (LIBRIUM) capsule 25 mg  25 mg Oral Q6H PRN Sanjuana Kava, NP      . Melene Muller  ON 10/12/2014] chlordiazePOXIDE (LIBRIUM) capsule 25 mg  25 mg Oral BH-qamhs Sanjuana Kava, NP       Followed by  . [START ON 10/13/2014] chlordiazePOXIDE (LIBRIUM) capsule 25 mg  25 mg Oral Daily Sanjuana Kava, NP      . cloNIDine (CATAPRES) tablet 0.1 mg  0.1 mg Oral QID Sanjuana Kava, NP   0.1 mg at 10/11/14 1642   Followed by  . [START ON 10/12/2014] cloNIDine (CATAPRES) tablet 0.1 mg  0.1 mg Oral BH-qamhs Sanjuana Kava, NP       Followed by  . [START ON 10/14/2014] cloNIDine (CATAPRES) tablet 0.1 mg  0.1 mg Oral QAC breakfast Sanjuana Kava, NP      . dicyclomine (BENTYL) tablet 20 mg  20 mg Oral Q6H PRN Sanjuana Kava, NP      . DULoxetine (CYMBALTA) DR capsule 30 mg  30 mg Oral Daily Rachael Fee, MD   30 mg at 10/11/14 0851  . feeding supplement (BOOST / RESOURCE BREEZE) liquid 1 Container  1 Container Oral TID BM Tilda Franco, RD   1 Container at 10/10/14 2107  . gabapentin (NEURONTIN) capsule 300 mg  300 mg Oral TID Rachael Fee, MD   300 mg at 10/11/14 1642  . hydrochlorothiazide (MICROZIDE) capsule 12.5 mg  12.5 mg Oral Daily Sanjuana Kava, NP   12.5 mg at 10/11/14 0851  . hydrOXYzine (ATARAX/VISTARIL) tablet 25 mg  25 mg Oral Q6H PRN Sanjuana Kava, NP      . loperamide (IMODIUM) capsule 2-4 mg  2-4 mg Oral PRN Sanjuana Kava, NP      . magnesium hydroxide (MILK OF MAGNESIA) suspension 30 mL  30 mL Oral Daily PRN Sanjuana Kava, NP      . methocarbamol (ROBAXIN) tablet 500 mg  500 mg Oral Q8H PRN Sanjuana Kava, NP   500 mg at 10/11/14 0853  . metoprolol tartrate (LOPRESSOR) tablet 25 mg  25 mg Oral BID Sanjuana Kava, NP   25 mg at 10/11/14 1642  . multivitamin with minerals tablet 1 tablet  1 tablet Oral Daily Sanjuana Kava, NP   1 tablet at 10/11/14 0851  . naproxen (NAPROSYN) tablet 500 mg  500 mg Oral BID PRN Sanjuana Kava, NP   500 mg at 10/11/14 1211  . nicotine (NICODERM CQ - dosed in mg/24 hours) patch 21 mg  21 mg Transdermal Q0600 Sanjuana Kava, NP   21 mg at 10/11/14 0854  .  ondansetron (ZOFRAN-ODT) disintegrating tablet 4 mg  4 mg Oral Q6H PRN Sanjuana Kava, NP   4 mg at 10/10/14 1709  . thiamine (VITAMIN B-1) tablet 100 mg  100 mg Oral Daily Nicole Kindred  I Nwoko, NP   100 mg at 10/11/14 0851  . traZODone (DESYREL) tablet 50 mg  50 mg Oral QHS PRN Sanjuana Kava, NP   50 mg at 10/10/14 2106    Lab Results:  Results for orders placed or performed during the hospital encounter of 10/09/14 (from the past 48 hour(s))  Lipid panel, fasting     Status: Abnormal   Collection Time: 10/10/14  6:40 AM  Result Value Ref Range   Cholesterol 235 (H) 0 - 200 mg/dL   Triglycerides 161 (H) <150 mg/dL   HDL 50 >09 mg/dL   Total CHOL/HDL Ratio 4.7 RATIO   VLDL 32 0 - 40 mg/dL   LDL Cholesterol 604 (H) 0 - 99 mg/dL    Comment:        Total Cholesterol/HDL:CHD Risk Coronary Heart Disease Risk Table                     Men   Women  1/2 Average Risk   3.4   3.3  Average Risk       5.0   4.4  2 X Average Risk   9.6   7.1  3 X Average Risk  23.4   11.0        Use the calculated Patient Ratio above and the CHD Risk Table to determine the patient's CHD Risk.        ATP III CLASSIFICATION (LDL):  <100     mg/dL   Optimal  540-981  mg/dL   Near or Above                    Optimal  130-159  mg/dL   Borderline  191-478  mg/dL   High  >295     mg/dL   Very High Performed at [facility]   TSH     Status: None   Collection Time: 10/10/14  6:40 AM  Result Value Ref Range   TSH 0.921 0.350 - 4.500 uIU/mL    Comment: Performed at [facility]    Physical Findings: AIMS: Facial and Oral Movements Muscles of Facial Expression: None, normal Lips and Perioral Area: None, normal Jaw: None, normal Tongue: None, normal,Extremity Movements Upper (arms, wrists, hands, fingers): None, normal Lower (legs, knees, ankles, toes): None, normal, Trunk Movements Neck, shoulders, hips: None, normal, Overall Severity Severity of abnormal movements (highest score from  questions above): None, normal Incapacitation due to abnormal movements: None, normal Patient's awareness of abnormal movements (rate only patient's report): No Awareness, Dental Status Current problems with teeth and/or dentures?: No Does patient usually wear dentures?: No  CIWA:  CIWA-Ar Total: 3 COWS:  COWS Total Score: 2  Treatment Plan Summary: Daily contact with patient to assess and evaluate symptoms and progress in treatment and Medication management Supportive approach/coping skills Alcohol dependence; continue Librium detox protocol Opioid dependence; continue the clonidine detox Work a relapse prevention plan Explore the possibility of administering the Vivitrol here Address the anxiety/depression will increase the Cymbalta to 60 mg  Explore residential treatment options  Medical Decision Making:  Review of Psycho-Social Stressors (1) and Review of Medication Regimen & Side Effects (2)     Gearldine Looney A 10/11/2014, 6:10 PM

## 2014-10-11 NOTE — Progress Notes (Signed)
Patient ID: Alexander Pope, male   DOB: 1980/10/07, 34 y.o.   MRN: [id number]  DAR: Pt. Denies SI/HI and A/V Hallucinations. He continues to report moderate withdrawal symptoms however now denies nausea which is an improvement from yesterday. Patient continues to report pain that is mainly in lower back and a headache, which patient reports Naprosyn provides no relief. Patient does not appear vested in treatment at the present time as evidenced by him remaining in bed throughout the day and his decision to not attend group even though he is capable. Support and encouragement provided to the patient. Scheduled medications administered to patient per physician's orders. Q15 minute checks are maintained for safety.

## 2014-10-11 NOTE — Progress Notes (Signed)
D: Pt presents anxious in affect and mood. Pt reports no improvement in his withdrawal symptoms. Pt presents less tremulous. Pt is currently denying any SI/HI/AVH. Pt remained in his bed for the majority of the shift.  A: Writer administered scheduled and prn medications to pt, per MD orders. Continued support and availability as needed was extended to this pt. Staff continue to monitor pt with q68min checks.  R: No adverse drug reactions noted. Pt receptive to treatment. Pt remains safe at this time.

## 2014-10-11 NOTE — Progress Notes (Signed)
Pt was invited to attend the NA speaker meeting for wrap up group. Pt stated "Screw that," and stayed in his room.

## 2014-10-11 NOTE — [facility] Group Notes (Signed)
[facility] LCSW Group Therapy  10/11/2014 12:27 PM  Type of Therapy:  Group Therapy  Participation Level:  Did Not Attend-pt chose to remain in bed.   Modes of Intervention:  Confrontation, Discussion, Education, Exploration, Problem-solving, Rapport Building, Socialization and Support  Summary of Progress/Problems: Today's Topic: Overcoming Obstacles. Patients identified one short term goal and potential obstacles in reaching this goal. Patients processed barriers involved in overcoming these obstacles. Patients identified steps necessary for overcoming these obstacles and explored motivation (internal and external) for facing these difficulties head on.   Smart, Alexander Pope LCSWA  10/11/2014, 12:27 PM

## 2014-10-11 NOTE — [facility] Group Notes (Signed)
[facility] LCSW Aftercare Discharge Planning Group Note   10/11/2014 9:03 AM  Participation Quality:  Minimal   Mood/Affect:  Depressed and Flat  Depression Rating:  8  Anxiety Rating:  5  Thoughts of Suicide:  No Will you contract for safety?   NA  Current AVH:  No  Plan for Discharge/Comments:  Pt reports that he does not know how he is feeling. Pt reports that he does not know where he will be living at d/c but plans to continue SAIOP at [facility]. No withdrawals reported however pt reports craving cigarettes "the nicotine patches aren't helping at all." Pt irritable.   Transportation Means: unknown at this time.   Supports: mother   Alexander Pope, Alexander Pope

## 2014-10-12 MED ORDER — TRAMADOL HCL 50 MG PO TABS
50.0000 mg | ORAL_TABLET | Freq: Four times a day (QID) | ORAL | Status: DC
  Administered 2014-10-12 – 2014-10-13 (×4): 50 mg via ORAL
  Filled 2014-10-12 (×4): qty 1

## 2014-10-12 NOTE — Progress Notes (Signed)
D:  Patient is alert and cooperative.  Denies auditory and visual hallucinations.  Complain of back pain.  Requesting Percocet or something stronger.  Refused Tylenol PRN when offered. A:  Medications given as ordered.  Maintained on routine safety checks per protocol. Support and encouragement offered as needed.  Patient given ultram 50 mg for complain of back pain.  Patient reports fair effect. R:  Patient in his room resting.  Patient sitting  in the dayroom after supper.

## 2014-10-12 NOTE — Progress Notes (Signed)
D: Pt has anxious affect and depressed mood.  He reports his goal today was to "make it through" and that his goal tonight is to "sleep good."   Pt denies SI/HI, denies hallucinations, reports back pain of 7/10.  He reports withdrawal symptoms of anxiety, nausea, and "I just don't feel good."  Pt has been visible in milieu. He did not attend evening group.   A: Introduced self to pt.  Met with pt 1:1 and provided support and encouragement.  Actively listened to pt and educated pt on medication regimen.  Medications administered per order.  PRN medication administered for pain, muscle spasms, and sleep. R: Pt is compliant with medications.  Pt verbally contracts for safety.  Will continue to monitor and assess.

## 2014-10-12 NOTE — [facility] Group Notes (Signed)
[facility] Group Notes:  (Nursing/MHT/Case Management/Adjunct)  Date:  10/12/2014  Time:  0900 Type of Therapy:  Nurse Education  Participation Level:  Did Not Attend                Mickie Bail 10/12/2014, 10:39 AM

## 2014-10-12 NOTE — Plan of Care (Signed)
Problem: Alteration in mood Goal: LTG-Patient reports reduction in suicidal thoughts (Patient reports reduction in suicidal thoughts and is able to verbalize a safety plan for whenever patient is feeling suicidal)  Outcome: Progressing Pt denies SI and verbally contracts for safety.       

## 2014-10-12 NOTE — Progress Notes (Signed)
[facility] MD Progress Note  10/12/2014 7:42 PM Alexander Pope  MRN:  [id number] Subjective:  Alexander Pope states that he is going to try to go back to his parents at least for a little while. He has his own place but he does not have transportation. He is still dealing with pain. He agrees that he needs to get an evaluation from a neurosurgeon as he was recommended. 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 Alcohol dependence [F10.20] 10/10/2014  . Opioid dependence [F11.20] 10/10/2014  . Substance induced mood disorder [F19.94] 10/10/2014   Total Time spent with patient: 30 minutes   Past Medical History:  Past Medical History  Diagnosis Date  . Hypertension   . Depression   . Anxiety    History reviewed. No pertinent past surgical history. Family History: History reviewed. No pertinent family history. Social History:  History  Alcohol Use  . 3.6 oz/week  . 6 Cans of beer per week    Comment: daily     History  Drug Use  . Yes  . Special: Cocaine, Benzodiazepines    Comment: "Molly"    Social History   Social History  . Marital Status: Single    Spouse Name: N/A  . Number of Children: N/A  . Years of Education: N/A   Social History Main Topics  . Smoking status: Current Every Day Smoker -- 1.00 packs/day    Types: Cigarettes  . Smokeless tobacco: None  . Alcohol Use: 3.6 oz/week    6 Cans of beer per week     Comment: daily  . Drug Use: Yes    Special: Cocaine, Benzodiazepines     Comment: "Molly"  . Sexual Activity: Not Currently   Other Topics Concern  . None   Social History Narrative   Additional History:    Sleep: Fair  Appetite:  Fair   Assessment:    Musculoskeletal: Strength & Muscle Tone: within normal limits Gait & Station: normal Patient leans: normal   Psychiatric Specialty Exam: Physical Exam  Review of Systems  Constitutional: Positive for malaise/fatigue.  HENT: Negative.   Eyes: Negative.   Respiratory: Negative.   Cardiovascular: Negative.   Gastrointestinal: Negative.   Genitourinary: Negative.   Musculoskeletal: Positive for back pain.  Skin: Negative.   Neurological: Negative.   Endo/Heme/Allergies: Negative.   Psychiatric/Behavioral: Positive for substance abuse. The patient is nervous/anxious.     Blood pressure 117/71, pulse 77, temperature 98 F (36.7 C), temperature source Oral, resp. rate 16, height  (1.778 m), weight 85.957 kg (189 lb 8 oz), SpO2 99 %.Body mass index is 27.19 kg/(m^2).  General Appearance: Fairly Groomed  Patent attorney::  Fair  Speech:  Clear and Coherent  Volume:  Decreased  Mood:  Anxious, Depressed and in pain  Affect:  Depressed and anxiosu worried teary eyed when talking about his kids  Thought Process:  Coherent and Goal Directed  Orientation:  Full (Time, Place, and Person)  Thought Content:  symptom events worries concerns  Suicidal Thoughts:  No  Homicidal Thoughts:  No  Memory:  Immediate;   Fair Recent;   Fair Remote;   Fair  Judgement:  Fair  Insight:  Present and Shallow  Psychomotor Activity:  Normal  Concentration:  Fair  Recall:  [employer] of Knowledge:Fair  Language: Fair  Akathisia:  No  Handed:  Right  AIMS (if indicated):     Assets:  Desire for Improvement  ADL's:  Intact  Cognition: WNL  Sleep:  Number of Hours: 6.75     Current Medications: Current Facility-Administered Medications  Medication Dose Route Frequency Provider Last Rate Last Dose  . acetaminophen (TYLENOL) tablet 650 mg  650 mg Oral Q6H PRN Sanjuana Kava, NP   650 mg at 10/12/14 1933  . alum & mag hydroxide-simeth (MAALOX/MYLANTA) 200-200-20 MG/5ML suspension 30 mL  30 mL  Oral Q4H PRN Sanjuana Kava, NP      . chlordiazePOXIDE (LIBRIUM) capsule 25 mg  25 mg Oral BH-qamhs Sanjuana Kava, NP   25 mg at 10/12/14 0836   Followed by  . [START ON 10/13/2014] chlordiazePOXIDE (LIBRIUM) capsule 25 mg  25 mg Oral Daily Sanjuana Kava, NP      . cloNIDine (CATAPRES) tablet 0.1 mg  0.1 mg Oral BH-qamhs Sanjuana Kava, NP   0.1 mg at 10/12/14 0842   Followed by  . [START ON 10/14/2014] cloNIDine (CATAPRES) tablet 0.1 mg  0.1 mg Oral QAC breakfast Sanjuana Kava, NP      . dicyclomine (BENTYL) tablet 20 mg  20 mg Oral Q6H PRN Sanjuana Kava, NP      . DULoxetine (CYMBALTA) DR capsule 60 mg  60 mg Oral Daily Rachael Fee, MD   60 mg at 10/12/14 0835  . feeding supplement (BOOST / RESOURCE BREEZE) liquid 1 Container  1 Container Oral TID BM Tilda Franco, RD   1 Container at 10/12/14 1934  . gabapentin (NEURONTIN) capsule 300 mg  300 mg Oral TID Rachael Fee, MD   300 mg at 10/12/14 1711  . hydrochlorothiazide (MICROZIDE) capsule 12.5 mg  12.5 mg Oral Daily Sanjuana Kava, NP   12.5 mg at 10/12/14 0835  . magnesium hydroxide (MILK OF MAGNESIA) suspension 30 mL  30 mL Oral Daily PRN Sanjuana Kava, NP      . methocarbamol (ROBAXIN) tablet 500 mg  500 mg Oral Q8H PRN Sanjuana Kava, NP   500 mg at 10/12/14 1935  . metoprolol tartrate (LOPRESSOR) tablet 25 mg  25 mg Oral BID Sanjuana Kava, NP   25 mg at 10/12/14 1711  . multivitamin with minerals tablet 1 tablet  1 tablet Oral Daily Sanjuana Kava, NP   1 tablet at 10/12/14 0836  . naproxen (NAPROSYN) tablet 500 mg  500 mg Oral BID PRN Sanjuana Kava, NP   500 mg at 10/12/14 7253  . nicotine (NICODERM CQ - dosed in mg/24 hours) patch 21 mg  21 mg Transdermal Q0600 Sanjuana Kava, NP   21 mg at 10/12/14 0800  . thiamine (VITAMIN B-1) tablet 100 mg  100 mg Oral Daily Sanjuana Kava, NP   100 mg at 10/12/14 0835  . traMADol (ULTRAM) tablet 50 mg  50 mg Oral 4 times per day Rachael Fee, MD   50 mg at 10/12/14 1823  . traZODone (DESYREL) tablet 50  mg  50 mg Oral QHS PRN Sanjuana Kava, NP   50 mg at 10/11/14 2143    Lab Results: No results found for this or any previous visit (from the past 48 hour(s)).  Physical Findings: AIMS:  Facial and Oral Movements Muscles of Facial Expression: None, normal Lips and Perioral Area: None, normal Jaw: None, normal Tongue: None, normal,Extremity Movements Upper (arms, wrists, hands, fingers): None, normal Lower (legs, knees, ankles, toes): None, normal, Trunk Movements Neck, shoulders, hips: None, normal, Overall Severity Severity of abnormal movements (highest score from questions above): None, normal Incapacitation due to abnormal movements: None, normal Patient's awareness of abnormal movements (rate only patient's report): No Awareness, Dental Status Current problems with teeth and/or dentures?: No Does patient usually wear dentures?: No  CIWA:  CIWA-Ar Total: 0 COWS:  COWS Total Score: 4  Treatment Plan Summary: Daily contact with patient to assess and evaluate symptoms and progress in treatment and Medication management Supportive approach/coping skills Alcohol dependence; continue Librium detox protocol Opioid dependence; will hold the plan of using Vivitrol as he might need to be on opioids at least temporarily down the road Work a relapse prevention plan Pain: will continue to work with the Neurontin and the Cymbalta Will make Tramadol available at 50 mg Q 6 H PRN  Medical Decision Making:  Review of Psycho-Social Stressors (1), Independent Review of image, tracing or specimen (2) and Review of Medication Regimen & Side Effects (2)     Marni Franzoni A 10/12/2014, 7:42 PM

## 2014-10-12 NOTE — Plan of Care (Signed)
Problem: Alteration in mood & ability to function due to Goal: STG-Patient will comply with prescribed medication regimen (Patient will comply with prescribed medication regimen)  Outcome: Progressing Patient is compliant with medication regime.     

## 2014-10-12 NOTE — [facility] Group Notes (Signed)
[facility] LCSW Group Therapy  10/12/2014 12:50 PM  Type of Therapy:  Group Therapy  Participation Level:  Did Not Attend -pt at medication window. Invited to group but chose to return to bed.   Modes of Intervention:  Confrontation, Discussion, Education, Exploration, Problem-solving, Rapport Building, Socialization and Support  Summary of Progress/Problems:  Finding Balance in Life. Today's group focused on defining balance in one's own words, identifying things that can knock one off balance, and exploring healthy ways to maintain balance in life. Group members were asked to provide an example of a time when they felt off balance, describe how they handled that situation,and process healthier ways to regain balance in the future. Group members were asked to share the most important tool for maintaining balance that they learned while at [name] and how they plan to apply this method after discharge.   Smart, [name] LCSWA 10/12/2014, 12:50 PM

## 2014-10-12 NOTE — Progress Notes (Signed)
Psychoeducational Group Note  Date:  10/12/2014 Time:  2045  Group Topic/Focus:  wrap up group  Participation Level: Did Not Attend  Participation Quality:  Not Applicable  Affect:  Not Applicable  Cognitive:  Not Applicable  Insight:  Not Applicable  Engagement in Group: Not Applicable  Additional Comments:  Pt was notified that group was beginning but returned to room.   Shelah Lewandowsky 10/12/2014, 10:39 PM

## 2014-10-12 NOTE — Progress Notes (Signed)
Patient ID: Alexander Pope, male   DOB: October 11, 1980, 34 y.o.   MRN: [id number] D: Took over patient's care @ 2330. Patient in bed sleeping. Respiration regular and unlabored. No sign of distress noted at this time A: 15 mins checks for safety. R: Patient is safe.

## 2014-10-12 NOTE — Progress Notes (Signed)
D: Patient alert and oriented x 4. Patient denies SI/HI/AVH. Patient complained of chronic back pain 3/10. Patient refused medications.  Patient was in room in his bed during shift. Patient was encouraged to come to medication room window for medications. Patient agreed and took all scheduled medications. Patient requested Trazodone to help him sleep. PRN was given at 2143 with effective results.  A: Staff to monitor Q 15 mins for safety. Encouragement and support offered. Scheduled medications administered per orders. R: Patient remains safe on the unit. Patient taking administered medications.

## 2014-10-13 ENCOUNTER — Encounter (HOSPITAL_COMMUNITY): Payer: Self-pay | Admitting: Registered Nurse

## 2014-10-13 DIAGNOSIS — F10230 Alcohol dependence with withdrawal, uncomplicated: Secondary | ICD-10-CM | POA: Insufficient documentation

## 2014-10-13 DIAGNOSIS — F1124 Opioid dependence with opioid-induced mood disorder: Secondary | ICD-10-CM | POA: Insufficient documentation

## 2014-10-13 MED ORDER — METOPROLOL TARTRATE 25 MG PO TABS: 25.0000 mg | ORAL_TABLET | Freq: Two times a day (BID) | ORAL | Status: DC

## 2014-10-13 MED ORDER — DICYCLOMINE HCL 20 MG PO TABS: 20.0000 mg | ORAL_TABLET | Freq: Four times a day (QID) | ORAL | Status: DC | PRN

## 2014-10-13 MED ORDER — ADULT MULTIVITAMIN W/MINERALS CH: 1.0000 | ORAL_TABLET | Freq: Every day | ORAL | Status: DC

## 2014-10-13 MED ORDER — HYDROCHLOROTHIAZIDE 12.5 MG PO CAPS: 12.5000 mg | ORAL_CAPSULE | Freq: Every day | ORAL | Status: DC

## 2014-10-13 MED ORDER — GABAPENTIN 300 MG PO CAPS: 300.0000 mg | ORAL_CAPSULE | Freq: Three times a day (TID) | ORAL | Status: AC

## 2014-10-13 MED ORDER — NAPROXEN 500 MG PO TABS: 500.0000 mg | ORAL_TABLET | Freq: Two times a day (BID) | ORAL | Status: DC | PRN

## 2014-10-13 MED ORDER — DULOXETINE HCL 60 MG PO CPEP: 60.0000 mg | ORAL_CAPSULE | Freq: Every day | ORAL | Status: DC

## 2014-10-13 MED ORDER — TRAMADOL HCL 50 MG PO TABS: 50.0000 mg | ORAL_TABLET | Freq: Three times a day (TID) | ORAL | Status: DC | PRN

## 2014-10-13 MED ORDER — TRAZODONE HCL 50 MG PO TABS: 50.0000 mg | ORAL_TABLET | Freq: Every evening | ORAL | Status: DC | PRN

## 2014-10-13 NOTE — Tx Team (Signed)
Interdisciplinary Treatment Plan Update (Adult)  Date:  10/13/2014  Time Reviewed:  10:11 AM   Progress in Treatment: Attending groups: Yes. Participating in groups:  Yes. Taking medication as prescribed:  Yes. Tolerating medication:  Yes. Family/Significant othe contact made:  Not yet. SPE required for this pt.  Patient understands diagnosis:  Yes. and As evidenced by:  seeking treatment for: alcohol detox, mood stabilization, depression.  Discussing patient identified problems/goals with staff:  Yes. Medical problems stabilized or resolved:  Yes. Denies suicidal/homicidal ideation: Yes. Issues/concerns per patient self-inventory:  Other  Discharge Plan or Barriers: Pt plans to continue med management and SAIOP at [facility]. Meeting tonight. AA list/mental health association info also provided. Pt to return home with his mother and father/2 kids at d/c.    Reason for Continuation of Hospitalization: none  Comments:  Alexander Pope is an 34 y.o. male. Presenting to ED after verbal conflict with father requesting inpt detox. Pt reports he was working on his mother's car all day and then his dad came home and began yelling at him, calling him names and saying he needed to be committed. Pt came to ED requesting detox and complaining of back pain, he was given OP resources and discharged. Pt began agitated and was adamant that he needed to be admitted somewhere. Pt denies SI, HI, and AVH. He reports he self injures out of boredom at times, putting cigarettes out on his arms. Pt reports 2 past suicide attempts 3-5 years ago but reports he can not recall what led to him doing that. Pt reports he has a lot of stressors. He reports he lives with his parents and his two children, and his parents care for the children. "I am basically just there." Pt reports his children's mother is in jail. Pt has a pending DUI charge, and conflict with his father. Pt reports he has no friends, and no place to go,  he is requesting inpt detox. Pt reports he has been feeling depressed for about two weeks. He endorses crying spells, irritability, loss of pleasure, trouble maintaining motivation, decreased grooming and self care, loss of appetite and trouble initiating and maintaining sleep. Pt reports agitated, high energy states, "where I feel like I could run through that wall" lasting 1-2 hours. Pt reports panic attacks about once a week, triggered by thinking about his faults. Reports he worries about "anything" and "everything" all of the time, and that this worry interferes with functioning. Denies hx of abuse or neglect, denies trauma hx. Pt reports he began drinking heavily in his mid twenties, drinking 12-18 beers for the last several years. He recently went to rehab in [facility], and relapsed about a week after his release. He has been drinking about a 6 pack a day for the last two months, and has a DUI pending. Pt reports he was prescribed pain pills many years ago for injury and began to abuse them. He reports his last taper dose of suboxone was 10-02-14, and he feels this may be why his mood has been so irritable lately. Pt uses THC infrequently, and has tried cocaine and heroin several times. Family hx is positive for etoh abuse. Pt denies hx of MH and SI concerns. Pt is able to contract for safety, and is requesting inpt detox.    Estimated length of stay:  D/c today    Additional Comments:  Patient and CSW reviewed pt's identified goals and treatment plan. Patient verbalized understanding and agreed to treatment plan.  CSW reviewed [facility] "Discharge Process and Patient Involvement" Form. Pt verbalized understanding of information provided and signed form.    Review of initial/current patient goals per problem list:  1. Goal(s): Patient will participate in aftercare plan  Met: Yes    Target date: at discharge  As evidenced by: Patient will participate within aftercare plan AEB aftercare provider and  housing plan at discharge being identified.  8/30: CSW assessing for appropriate referrals at this time.   9/2: Pt to return home/followup at the [facility].  2. Goal (s): Patient will exhibit decreased depressive symptoms and suicidal ideations.  Met: Yes    Target date: at discharge  As evidenced by: Patient will utilize self rating of depression at 3 or below and demonstrate decreased signs of depression or be deemed stable for discharge by MD.  8/30: Pt rates depression as high today with no SI/HI/AVH. Some agitation.   9/2: Pt rates depression as 0/10 and presents with pleasant mood and calm affect.   4. Goal(s): Patient will demonstrate decreased signs of withdrawal due to substance abuse  Met:Yes  Target date:at discharge   As evidenced by: Patient will produce a CIWA/COWS score of 0, have stable vitals signs, and no symptoms of withdrawal.  8/30: Pt reports moderate withdrawals with CIWA score of 5 and high standing/sitting BP.   9/2: Pt reports no signs of withdrawal with COW score of 3 and stable vitals. Per Md, Pt is medically stable for d/c today.  Attendees: Patient:   10/13/2014 10:11 AM   Family:   10/13/2014 10:11 AM   Physician:  Dr. Carlton Tobey, MD 10/13/2014 10:11 AM   Nursing:   Delsa Bern RN  10/13/2014 10:11 AM   Clinical Social Worker: Maxie Better, [place]  10/13/2014 10:11 AM   Clinical Social Worker: Erasmo Downer Drinkard LCSWA; Peri Maris LCSWA 10/13/2014 10:11 AM   Other:  Gerline Legacy Nurse Case Manager 10/13/2014 10:11 AM   Other:  Lucinda Dell; [facility]  10/13/2014 10:11 AM   Other:   10/13/2014 10:11 AM   Other:  10/13/2014 10:11 AM   Other:  10/13/2014 10:11 AM   Other:  10/13/2014 10:11 AM    10/13/2014 10:11 AM    10/13/2014 10:11 AM    10/13/2014 10:11 AM    10/13/2014 10:11 AM    Scribe for Treatment Team:   Maxie Better, [place]  10/13/2014 10:11 AM

## 2014-10-13 NOTE — Progress Notes (Signed)
  [facility] Adult Case Management Discharge Plan :  Will you be returning to the same living situation after discharge:  Yes,  home with parents  At discharge, do you have transportation home?: Yes,  [facility] will transport pt to [facility] at 1:00PM. From there, pt's mother will pick him up. Do you have the ability to pay for your medications: Yes,  [employer] private insurance  Release of information consent forms completed and submitted to medical records by CSW.  Patient to Follow up at: Follow-up Information    Follow up with [facility] On 10/13/2014.   Why:  Appt on this date from 6:30PM-9:00PM for Substance Abuse Intensive Outpatient Group.    Contact information:   [address] [place], [place] [address] Phone: [phone] Fax: [phone]      Patient denies SI/HI: Yes,  during group/self report.    Safety Planning and Suicide Prevention discussed: Yes,  SPE completed with pt, as he refused to consent to family contact. SPI pamphlet provided to pt.  Have you used any form of tobacco in the last 30 days? (Cigarettes, Smokeless Tobacco, Cigars, and/or Pipes): Yes  Has patient been referred to the Quitline?: Patient refused referral  Smart, Lebron Quam 10/13/2014, 10:15 AM

## 2014-10-13 NOTE — Progress Notes (Signed)
Adult Psychoeducational Group Note  Date:  10/13/2014 Time:  10:43 AM  Group Topic/Focus:  Early Warning Signs:   The focus of this group is to help patients identify signs or symptoms they exhibit before slipping into an unhealthy state or crisis. Relapse Prevention Planning:   The focus of this group is to define relapse and discuss the need for planning to combat relapse.  Participation Level:  Active  Participation Quality:  Attentive  Affect:  Appropriate  Cognitive:  Appropriate  Insight: Good  Engagement in Group:  Engaged  Modes of Intervention:  Discussion  Additional Comments:  Pt talked about his personal defintion of recovery.  Pt listed some coping skills that had helped him in the past when he was on a path to recovery.  Pt discussed talking and personal relationships as coping skills that have helped him in the past.  Maude Leriche 10/13/2014, 10:43 AM

## 2014-10-13 NOTE — Progress Notes (Signed)
Patient is calm and cooperative.  Denies suicidal ideation. Patient discharge home with sample medications and prescriptions.  Discharge instructions and medications and prescriptions reviewed with patient.  Personal belongings returned to patient. No complain reported.

## 2014-10-13 NOTE — Discharge Summary (Signed)
Physician Discharge Summary Note  Patient:  Alexander Pope is an 34 y.o., male MRN:  [id number] DOB:  1980-06-27 Patient phone:  [phone] (home)  Patient address:   [address] [place] [address],  Total Time spent with patient: 45 minutes  Date of Admission:  10/09/2014 Date of Discharge: 10/13/2014  Reason for Admission:  Per H&P Note:  34 Y/o male who states that at end of May he went to treatment in [place]. After he came out he went to stay with his parents. States he was washing his car, drinking a beer or two. States that father got home and they got in an argument. Has been going to the [facility]. States he drinks 6-8 beers a day. States he was using opioids when he went to the residential treatment center. In the [facility] he was given a quick opioid Detox with Suboxone.. Came off in four days down to 4 mg of Suboxone and then none. He took the last Suboxone last 7 days ago. He is not sure if he wants to stop drinking altogether.  The initial assessment is as follows: Alexander Pope is an 34 y.o. male. Presenting to ED after verbal conflict with father requesting inpt detox. Pt reports he was working on his mother's car all day and then his dad came home and began yelling at him, calling him names and saying he needed to be committed. Pt came to ED requesting detox and complaining of back pain, he was given OP resources and discharged. Pt began agitated and was adamant that he needed to be admitted somewhere. At the time of assessment pt was irritable and depressed with labile affect. He was oriented times 4, with partial judgement. Pt denies SI, HI, and AVH. He reports he self injures out of boredom at times, putting cigarettes out on his arms. Pt reports 2 past suicide attempts 3-5 years ago but reports he can not recall what led to him doing that. Pt reports he has a lot of stressors. He reports he lives with his parents and his two children, and his parents care  for the children. "I am basically just there." Pt reports his children's mother is in jail. Pt has a pending DUI charge, and conflict with his father. Pt reports he has no friends, and no place to go, he is requesting inpt detox.  Pt reports he has been feeling depressed for about two weeks. He endorses crying spells, irritability, loss of pleasure, trouble maintaining motivation, decreased grooming and self care, loss of appetite and trouble initiating and maintaining sleep. Pt reports agitated, high energy states, "where I feel like I could run through that wall" lasting 1-2 hours.  Pt reports panic attacks about once a week, triggered by thinking about his faults. Reports he worries about "anything" and "everything" all of the time, and that this worry interferes with functioning. Denies hx of abuse or neglect, denies trauma hx.  Pt reports he began drinking heavily in his mid twenties, drinking 12-18 beers for the last several years. He recently went to rehab in [place] [facility], and relapsed about a week after his release. He has been drinking about a 6 pack a day for the last two months, and has a DUI pending. Pt reports he was prescribed pain pills many years ago for injury and began to abuse them. He reports his last taper dose of suboxone was 10-02-14, and he feels this may be why his mood has been  so irritable lately. Pt uses THC infrequently, and has tried cocaine and heroin several times.   Principal Problem: Substance induced mood disorder Discharge Diagnoses: Patient Active Problem List   Diagnosis Date Noted  . Alcohol dependence with uncomplicated withdrawal [F10.230]   . Opioid dependence with opioid-induced mood disorder [F11.24]   . Alcohol dependence [F10.20] 10/10/2014  . Opioid dependence [F11.20] 10/10/2014  . Substance induced mood disorder [F19.94] 10/10/2014    Musculoskeletal: Strength & Muscle Tone: within normal limits Gait & Station: normal Patient leans: N/A  Psychiatric  Specialty Exam: See Suicide Risk Assessment Physical Exam  Nursing note and vitals reviewed. Constitutional: He is oriented to person, place, and time.  Neck: Normal range of motion.  Respiratory: Effort normal.  Musculoskeletal: Normal range of motion.  Neurological: He is alert and oriented to person, place, and time.    Review of Systems  Cardiovascular:       HTN  Psychiatric/Behavioral: Positive for substance abuse. Negative for suicidal ideas and hallucinations. Depression: Stable. Nervous/anxious: Stable. Insomnia: Stable.   All other systems reviewed and are negative.   Blood pressure 125/78, pulse 68, temperature 98.1 F (36.7 C), temperature source Oral, resp. rate 20, height 5\' 10"  (1.778 m), weight 85.957 kg (189 lb 8 oz), SpO2 99 %.Body mass index is 27.19 kg/(m^2).  Have you used any form of tobacco in the last 30 days? (Cigarettes, Smokeless Tobacco, Cigars, and/or Pipes): Yes  Has this patient used any form of tobacco in the last 30 days? (Cigarettes, Smokeless Tobacco, Cigars, and/or Pipes) Yes, A prescription for an FDA-approved tobacco cessation medication was offered at discharge and the patient refused  Past Medical History:  Past Medical History  Diagnosis Date  . Hypertension   . Depression   . Anxiety    History reviewed. No pertinent past surgical history. Family History: History reviewed. No pertinent family history. Social History:  History  Alcohol Use  . 3.6 oz/week  . 6 Cans of beer per week    Comment: daily     History  Drug Use  . Yes  . Special: Cocaine, Benzodiazepines    Comment: "Molly"    Social History   Social History  . Marital Status: Single    Spouse Name: N/A  . Number of Children: N/A  . Years of Education: N/A   Social History Main Topics  . Smoking status: Current Every Day Smoker -- 1.00 packs/day    Types: Cigarettes  . Smokeless tobacco: None  . Alcohol Use: 3.6 oz/week    6 Cans of beer per week     Comment:  daily  . Drug Use: Yes    Special: Cocaine, Benzodiazepines     Comment: "Molly"  . Sexual Activity: Not Currently   Other Topics Concern  . None   Social History Narrative   Risk to Self: Is patient at risk for suicide?: No Risk to Others:   Prior Inpatient Therapy:   Prior Outpatient Therapy:    Level of Care:  OP  Hospital Course:  Alexander Pope was admitted for Substance induced mood disorder and crisis management.  He was treated discharged with the medications listed below under Medication List.  Medical problems were identified and treated as needed.  Home medications were restarted as appropriate.  Improvement was monitored by observation and Kandice Hams daily report of symptom reduction.  Emotional and mental status was monitored by daily self-inventory reports completed by Kandice Hams and clinical staff.  Kandice Hams was evaluated by the treatment team for stability and plans for continued recovery upon discharge.  Kandice Hams motivation was an integral factor for scheduling further treatment.  Employment, transportation, bed availability, health status, family support, and any pending legal issues were also considered during his hospital stay.  He was offered further treatment options upon discharge including but not limited to Residential, Intensive Outpatient, and Outpatient treatment.  Kandice Hams will follow up with the services as listed below under Follow Up Information.     Upon completion of this admission the patient was both mentally and medically stable for discharge denying suicidal/homicidal ideation, auditory/visual/tactile hallucinations, delusional thoughts and paranoia.      Consults:  psychiatry  Significant Diagnostic Studies:  labs: Reviewed  Discharge Vitals:   Blood pressure 125/78, pulse 68, temperature 98.1 F (36.7 C), temperature source Oral, resp. rate 20, height 5\' 10"  (1.778 m), weight 85.957 kg (189 lb 8 oz), SpO2 99 %. Body  mass index is 27.19 kg/(m^2). Lab Results:   No results found for this or any previous visit (from the past 72 hour(s)).  Physical Findings: AIMS: Facial and Oral Movements Muscles of Facial Expression: None, normal Lips and Perioral Area: None, normal Jaw: None, normal Tongue: None, normal,Extremity Movements Upper (arms, wrists, hands, fingers): None, normal Lower (legs, knees, ankles, toes): None, normal, Trunk Movements Neck, shoulders, hips: None, normal, Overall Severity Severity of abnormal movements (highest score from questions above): None, normal Incapacitation due to abnormal movements: None, normal Patient's awareness of abnormal movements (rate only patient's report): No Awareness, Dental Status Current problems with teeth and/or dentures?: No Does patient usually wear dentures?: No  CIWA:  CIWA-Ar Total: 0 COWS:  COWS Total Score: 3   See Psychiatric Specialty Exam and Suicide Risk Assessment completed by Attending Physician prior to discharge.  Discharge destination:  Home  Is patient on multiple antipsychotic therapies at discharge:  No   Has Patient had three or more failed trials of antipsychotic monotherapy by history:  No    Recommended Plan for Multiple Antipsychotic Therapies: NA      Discharge Instructions    Diet - low sodium heart healthy    Complete by:  As directed      Discharge instructions    Complete by:  As directed   Take all of you medications as prescribed by your mental healthcare provider.  Report any adverse effects and reactions from your medications to your outpatient provider promptly. Do not engage in alcohol and or illegal drug use while on prescription medicines. In the event of worsening symptoms call the crisis hotline, 911, and or go to the nearest emergency department for appropriate evaluation and treatment of symptoms. Follow-up with your primary care provider for your medical issues, concerns and or health care needs.    Keep all scheduled appointments.  If you are unable to keep an appointment call to reschedule.  Let the nurse know if you will need medications before next scheduled appointment.     Increase activity slowly    Complete by:  As directed             Medication List    STOP taking these medications        amphetamine-dextroamphetamine 30 MG tablet  Commonly known as:  ADDERALL     ibuprofen 200 MG tablet  Commonly known as:  ADVIL,MOTRIN      TAKE these medications      Indication   dicyclomine  20 MG tablet  Commonly known as:  BENTYL  Take 1 tablet (20 mg total) by mouth every 6 (six) hours as needed for spasms (abdominal cramping).   Indication:  adominal cramping     DULoxetine 60 MG capsule  Commonly known as:  CYMBALTA  Take 1 capsule (60 mg total) by mouth daily.   Indication:  Major Depressive Disorder     gabapentin 300 MG capsule  Commonly known as:  NEURONTIN  Take 1 capsule (300 mg total) by mouth 3 (three) times daily.   Indication:  Agitation, Alcohol Withdrawal Syndrome     hydrochlorothiazide 12.5 MG capsule  Commonly known as:  MICROZIDE  Take 1 capsule (12.5 mg total) by mouth daily.   Indication:  High Blood Pressure, HTN     metoprolol tartrate 25 MG tablet  Commonly known as:  LOPRESSOR  Take 1 tablet (25 mg total) by mouth 2 (two) times daily.   Indication:  High Blood Pressure     multivitamin with minerals Tabs tablet  Take 1 tablet by mouth daily.   Indication:  Nutritional Support     naproxen 500 MG tablet  Commonly known as:  NAPROSYN  Take 1 tablet (500 mg total) by mouth 2 (two) times daily as needed (aching, pain, or discomfort).   Indication:  Pain     traMADol 50 MG tablet  Commonly known as:  ULTRAM  Take 1 tablet (50 mg total) by mouth 3 (three) times daily as needed for moderate pain or severe pain.   Indication:  Moderate to Moderately Severe Pain     traZODone 50 MG tablet  Commonly known as:  DESYREL  Take 1 tablet  (50 mg total) by mouth at bedtime as needed for sleep.   Indication:  Trouble Sleeping       Follow-up Information    Follow up with [facility] On 10/13/2014.   Why:  Appt on this date from 6:30PM-9:00PM for Substance Abuse Intensive Outpatient Group.    Contact information:   [address] [place], [place] [address] Phone: [phone] Fax: [phone]      Follow-up recommendations:  Activity:  As tolerated Diet:  Low Sodium  Comments:   Patient has been instructed to take medications as prescribed; and report adverse effects to outpatient provider.  Follow up with primary doctor for any medical issues and If symptoms recur report to nearest emergency or crisis hot line.    Total Discharge Time: 45 minute  Signed: Assunta Found, FNP-BC 10/13/2014, 4:33 PM  I personally assessed the patient and formulated the plan Madie Reno A. Dub Mikes, M.D.

## 2014-10-13 NOTE — Progress Notes (Signed)
Patient is calm and cooperative.  Denies suicidal ideation.  Patient discharge home with prescriptions.  Discharge instructions and medication prescriptions reviewed with patient.  Verbalizes understanding of discharge instructions.  Personal belongings returned to patient.  No complain reported.

## 2014-10-13 NOTE — [facility] Suicide Risk Assessment (Signed)
[facility] Discharge Suicide Risk Assessment   Demographic Factors:  Male and Caucasian  Total Time spent with patient: 30 minutes  Musculoskeletal: Strength & Muscle Tone: within normal limits Gait & Station: normal Patient leans: normal  Psychiatric Specialty Exam: Physical Exam  Review of Systems  Constitutional: Negative.   HENT: Negative.   Eyes: Negative.   Respiratory: Negative.   Cardiovascular: Negative.   Gastrointestinal: Negative.   Genitourinary: Negative.   Musculoskeletal: Positive for back pain.  Skin: Negative.   Endo/Heme/Allergies: Negative.   Psychiatric/Behavioral: Positive for substance abuse.    Blood pressure 125/78, pulse 68, temperature 98.1 F (36.7 C), temperature source Oral, resp. rate 20, height 5\' 10"  (1.778 m), weight 85.957 kg (189 lb 8 oz), SpO2 99 %.Body mass index is 27.19 kg/(m^2).  General Appearance: Fairly Groomed  Patent attorney::  Fair  Speech:  Clear and Coherent409  Volume:  Normal  Mood:  Euthymic  Affect:  Appropriate  Thought Process:  Coherent and Goal Directed  Orientation:  Full (Time, Place, and Person)  Thought Content:  plans as he moves on, relapse prevention plan  Suicidal Thoughts:  No  Homicidal Thoughts:  No  Memory:  Immediate;   Fair Recent;   Fair Remote;   Fair  Judgement:  Fair  Insight:  Present  Psychomotor Activity:  Normal  Concentration:  Fair  Recall:  [employer] of Knowledge:Fair  Language: Fair  Akathisia:  No  Handed:  Right  AIMS (if indicated):     Assets:  Desire for Improvement Housing  Sleep:  Number of Hours: 6  Cognition: WNL  ADL's:  Intact   Have you used any form of tobacco in the last 30 days? (Cigarettes, Smokeless Tobacco, Cigars, and/or Pipes): Yes  Has this patient used any form of tobacco in the last 30 days? (Cigarettes, Smokeless Tobacco, Cigars, and/or Pipes) Yes, A prescription for an FDA-approved tobacco cessation medication was offered at discharge and the patient  refused  Mental Status Per Nursing Assessment::   On Admission:  Thoughts of violence towards others  Current Mental Status by Physician: In full contact with reality. There are no active SI plans or intent. There are no active S/S of withdrawal. He is going to request to be referred back to Dr. Channing Mutters neurosurgeon to see what can be offered for his back. He is going to his place, not his parents. States there is no alcohol there. He states he will just go day by day   Loss Factors: Legal issues and Financial problems/change in socioeconomic status  Historical Factors: NA  Risk Reduction Factors:   Sense of responsibility to family and Positive social support  Continued Clinical Symptoms:  Alcohol/Substance Abuse/Dependencies  Cognitive Features That Contribute To Risk:  Closed-mindedness, Polarized thinking and Thought constriction (tunnel vision)    Suicide Risk:  Minimal: No identifiable suicidal ideation.  Patients presenting with no risk factors but with morbid ruminations; may be classified as minimal risk based on the severity of the depressive symptoms  Principal Problem: Substance induced mood disorder Discharge Diagnoses:  Patient Active Problem List   Diagnosis Date Noted  . Alcohol dependence [F10.20] 10/10/2014  . Opioid dependence [F11.20] 10/10/2014  . Substance induced mood disorder [F19.94] 10/10/2014    Follow-up Information    Follow up with [facility] On 10/13/2014.   Why:  Appt on this date from 6:30PM-9:00PM for Substance Abuse Intensive Outpatient Group.    Contact information:   [address] [place], [place] [address]  Phone: [phone] Fax: [phone]      Plan Of Care/Follow-up recommendations:  Activity:  as tolerated Diet:  regular Follow up [facility] as above Is patient on multiple antipsychotic therapies at discharge:  No   Has Patient had three or more failed trials of antipsychotic monotherapy by history:  No  Recommended  Plan for Multiple Antipsychotic Therapies: NA    Akela Pocius A 10/13/2014, 1:59 PM

## 2015-02-21 ENCOUNTER — Other Ambulatory Visit (HOSPITAL_COMMUNITY): Payer: Self-pay | Admitting: *Deleted

## 2015-02-21 DIAGNOSIS — N5089 Other specified disorders of the male genital organs: Secondary | ICD-10-CM

## 2015-02-22 ENCOUNTER — Other Ambulatory Visit (HOSPITAL_COMMUNITY): Payer: Self-pay | Admitting: *Deleted

## 2015-02-22 DIAGNOSIS — N5089 Other specified disorders of the male genital organs: Secondary | ICD-10-CM

## 2015-02-23 ENCOUNTER — Ambulatory Visit (HOSPITAL_COMMUNITY)
Admission: RE | Admit: 2015-02-23 | Discharge: 2015-02-23 | Disposition: A | Discharge status: Home / Self Care | Payer: BLUE CROSS/BLUE SHIELD | Source: Ambulatory Visit | Attending: *Deleted | Admitting: *Deleted

## 2015-02-23 DIAGNOSIS — N509 Disorder of male genital organs, unspecified: Secondary | ICD-10-CM | POA: Diagnosis present

## 2015-02-23 DIAGNOSIS — N5089 Other specified disorders of the male genital organs: Secondary | ICD-10-CM

## 2015-02-23 DIAGNOSIS — I861 Scrotal varices: Secondary | ICD-10-CM | POA: Insufficient documentation

## 2015-05-10 ENCOUNTER — Emergency Department (HOSPITAL_COMMUNITY)
Admission: EM | Admit: 2015-05-10 | Discharge: 2015-05-10 | Disposition: A | Discharge status: Home / Self Care | Payer: BLUE CROSS/BLUE SHIELD | Attending: Emergency Medicine | Admitting: Emergency Medicine

## 2015-05-10 ENCOUNTER — Encounter (HOSPITAL_COMMUNITY): Payer: Self-pay | Admitting: Emergency Medicine

## 2015-05-10 ENCOUNTER — Emergency Department (HOSPITAL_COMMUNITY): Payer: BLUE CROSS/BLUE SHIELD

## 2015-05-10 DIAGNOSIS — F329 Major depressive disorder, single episode, unspecified: Secondary | ICD-10-CM | POA: Insufficient documentation

## 2015-05-10 DIAGNOSIS — F17210 Nicotine dependence, cigarettes, uncomplicated: Secondary | ICD-10-CM | POA: Diagnosis not present

## 2015-05-10 DIAGNOSIS — I10 Essential (primary) hypertension: Secondary | ICD-10-CM | POA: Insufficient documentation

## 2015-05-10 DIAGNOSIS — J111 Influenza due to unidentified influenza virus with other respiratory manifestations: Secondary | ICD-10-CM | POA: Diagnosis not present

## 2015-05-10 DIAGNOSIS — R05 Cough: Secondary | ICD-10-CM | POA: Diagnosis present

## 2015-05-10 DIAGNOSIS — R6889 Other general symptoms and signs: Secondary | ICD-10-CM

## 2015-05-10 MED ORDER — BENZONATATE 200 MG PO CAPS: 200.0000 mg | ORAL_CAPSULE | Freq: Three times a day (TID) | ORAL | Status: DC | PRN

## 2015-05-10 MED ORDER — GUAIFENESIN-DM 100-10 MG/5ML PO SYRP
10.0000 mL | ORAL_SOLUTION | Freq: Once | ORAL | Status: AC
  Administered 2015-05-10: 10 mL via ORAL
  Filled 2015-05-10: qty 10

## 2015-05-10 MED ORDER — IPRATROPIUM-ALBUTEROL 0.5-2.5 (3) MG/3ML IN SOLN
3.0000 mL | Freq: Once | RESPIRATORY_TRACT | Status: AC
  Administered 2015-05-10: 3 mL via RESPIRATORY_TRACT
  Filled 2015-05-10: qty 3

## 2015-05-10 MED ORDER — PREDNISONE 10 MG PO TABS: 20.0000 mg | ORAL_TABLET | Freq: Two times a day (BID) | ORAL | Status: DC

## 2015-05-10 MED ORDER — BENZONATATE 100 MG PO CAPS: 100.0000 mg | ORAL_CAPSULE | Freq: Three times a day (TID) | ORAL | Status: DC

## 2015-05-10 MED ORDER — ALBUTEROL SULFATE HFA 108 (90 BASE) MCG/ACT IN AERS
1.0000 | INHALATION_SPRAY | RESPIRATORY_TRACT | Status: DC | PRN
  Administered 2015-05-10: 2 via RESPIRATORY_TRACT
  Filled 2015-05-10: qty 6.7

## 2015-05-10 MED ORDER — PREDNISONE 50 MG PO TABS
60.0000 mg | ORAL_TABLET | Freq: Once | ORAL | Status: AC
  Administered 2015-05-10: 60 mg via ORAL
  Filled 2015-05-10: qty 1

## 2015-05-10 NOTE — ED Notes (Signed)
Taken NyQuil, OTC meds for cold, tylenol-last this morning

## 2015-05-10 NOTE — Discharge Instructions (Signed)
Take the cough medication and use the inhaler every 4 hours as needed. Take tylenol and ibuprofen as needed for aching and/or fever. Return for worsening symptoms.

## 2015-05-10 NOTE — ED Notes (Signed)
Pt reports generalized body aches, cough, intermittent fever since Sunday. nad noted.

## 2015-05-10 NOTE — ED Notes (Signed)
Ha, body aches, chills and cough since Sunday

## 2015-05-10 NOTE — ED Provider Notes (Signed)
CSN: [id number]     Arrival date & time 05/10/15  1408 History   First MD Initiated Contact with Patient 05/10/15 1603     Chief Complaint  Patient presents with  . Cough     (Consider location/radiation/quality/duration/timing/severity/associated sxs/prior Treatment) Patient is a 35 y.o. male presenting with flu symptoms. The history is provided by the patient.  Influenza Presenting symptoms: cough, fever, headache, myalgias, rhinorrhea, shortness of breath and sore throat   Severity:  Severe Onset quality:  Gradual Duration:  5 days Progression:  Improving Chronicity:  New Relieved by:  Nothing Worsened by:  Nothing tried Ineffective treatments:  OTC medications, hot fluids and rest Associated symptoms: chills, decreased appetite, decreased physical activity, nasal congestion and neck stiffness   Risk factors: sick contacts    Alexander Pope is a 35 y.o. male with hx of HTN and current every day smoker, presents to the ED with flu like symptoms that have been going on for the past 5 days. He lives in a substance abuse recovery home and most everyone there has been sick. Patient reports that his fever gets "high" at night and then he breaks out in a sweat and gets cold. He reports that the past 3 days he has been so sick he really hasn't known what was going on. He actually feels a little better today.   Past Medical History  Diagnosis Date  . Hypertension   . Depression   . Anxiety    History reviewed. No pertinent past surgical history. History reviewed. No pertinent family history. Social History  Substance Use Topics  . Smoking status: Current Every Day Smoker -- 1.00 packs/day    Types: Cigarettes  . Smokeless tobacco: None  . Alcohol Use: No    Review of Systems  Constitutional: Positive for fever, chills and decreased appetite.  HENT: Positive for congestion, rhinorrhea and sore throat.   Respiratory: Positive for cough and shortness of breath.   Musculoskeletal:  Positive for myalgias and neck stiffness.  Neurological: Positive for headaches.  all other systems negative    Allergies  Review of patient's allergies indicates no known allergies.  Home Medications   Prior to Admission medications   Medication Sig Start Date End Date Taking? Authorizing Provider  benzonatate (TESSALON) 200 MG capsule Take 1 capsule (200 mg total) by mouth 3 (three) times daily as needed for cough. 05/10/15   Hope Orlene Och, NP  dicyclomine (BENTYL) 20 MG tablet Take 1 tablet (20 mg total) by mouth every 6 (six) hours as needed for spasms (abdominal cramping). 10/13/14   Shuvon B Rankin, NP  DULoxetine (CYMBALTA) 60 MG capsule Take 1 capsule (60 mg total) by mouth daily. 10/13/14   Shuvon B Rankin, NP  gabapentin (NEURONTIN) 300 MG capsule Take 1 capsule (300 mg total) by mouth 3 (three) times daily. 10/13/14   Shuvon B Rankin, NP  hydrochlorothiazide (MICROZIDE) 12.5 MG capsule Take 1 capsule (12.5 mg total) by mouth daily. 10/13/14   Shuvon B Rankin, NP  metoprolol tartrate (LOPRESSOR) 25 MG tablet Take 1 tablet (25 mg total) by mouth 2 (two) times daily. 10/13/14   Shuvon B Rankin, NP  Multiple Vitamin (MULTIVITAMIN WITH MINERALS) TABS tablet Take 1 tablet by mouth daily. 10/13/14   Shuvon B Rankin, NP  naproxen (NAPROSYN) 500 MG tablet Take 1 tablet (500 mg total) by mouth 2 (two) times daily as needed (aching, pain, or discomfort). 10/13/14   Shuvon B Rankin, NP  predniSONE (DELTASONE) 10 MG tablet  Take 2 tablets (20 mg total) by mouth 2 (two) times daily with a meal. Start 05/01/15 05/10/15   Hope Orlene OchM Neese, NP  traMADol (ULTRAM) 50 MG tablet Take 1 tablet (50 mg total) by mouth 3 (three) times daily as needed for moderate pain or severe pain. 10/13/14   Shuvon B Rankin, NP  traZODone (DESYREL) 50 MG tablet Take 1 tablet (50 mg total) by mouth at bedtime as needed for sleep. 10/13/14   Shuvon B Rankin, NP   BP 128/84 mmHg  Pulse 105  Temp(Src) 98.9 F (37.2 C) (Oral)  Resp 18  Ht 5\' 11"   (1.803 m)  Wt 90.719 kg  BMI 27.91 kg/m2  SpO2 95% Physical Exam  Constitutional: He is oriented to person, place, and time. He appears well-developed and well-nourished. No distress.  HENT:  Head: Normocephalic.  Eyes: EOM are normal. Right conjunctiva is not injected. Left conjunctiva is not injected.  Neck: Neck supple.  Cardiovascular: Normal rate and regular rhythm.   Pulmonary/Chest: Effort normal. No respiratory distress. He has decreased breath sounds. He has wheezes. He has no rales.  Abdominal: Soft. There is no tenderness.  Musculoskeletal: Normal range of motion.  Neurological: He is alert and oriented to person, place, and time. No cranial nerve deficit.  Skin: Skin is warm and dry.  Psychiatric: He has a normal mood and affect. His behavior is normal.  Nursing note and vitals reviewed.   ED Course  Procedures (including critical care time) Duo Neb, CXR, Prednisone  Re examined after Neb treatment and lungs are clear Albuterol Inhaler given prior to d/c with instructions  Labs Review Labs Reviewed - No data to display  Imaging Review Dg Chest 2 View  05/10/2015  CLINICAL DATA:  Patient with nonproductive cough, weakness and fever. Body aches for 5 days. EXAM: CHEST  2 VIEW COMPARISON:  Chest radiograph 01/14/2011 FINDINGS: Stable cardiac and mediastinal contours. No consolidative pulmonary opacities. No pleural effusion or pneumothorax. Regional skeleton is unremarkable. IMPRESSION: No active cardiopulmonary disease. Electronically Signed   By: Annia Beltrew  Davis M.D.   On: 05/10/2015 17:03    MDM  35 y.o. male with cough, congestion and flu like symptoms stable for d/c without pneumonia on x-ray and no respiratory distress, 02 SAT 95% on R/A. Rx for prednisone, Tessalon and instructions on fever management and symptoms that would indicate need for return.  Form completed for the Rehab Home where patient is living.   Final diagnoses:  Flu-like symptoms       [name]  Neese, NP 05/11/15 [id number]  Marily MemosJason Mesner, MD 05/14/15 [id number]

## 2015-10-21 DIAGNOSIS — M545 Low back pain: Secondary | ICD-10-CM | POA: Diagnosis present

## 2015-10-21 DIAGNOSIS — I10 Essential (primary) hypertension: Secondary | ICD-10-CM | POA: Insufficient documentation

## 2015-10-21 DIAGNOSIS — G8929 Other chronic pain: Secondary | ICD-10-CM | POA: Insufficient documentation

## 2015-10-21 DIAGNOSIS — F17210 Nicotine dependence, cigarettes, uncomplicated: Secondary | ICD-10-CM | POA: Insufficient documentation

## 2015-10-21 DIAGNOSIS — R200 Anesthesia of skin: Secondary | ICD-10-CM | POA: Diagnosis not present

## 2015-10-21 NOTE — ED Triage Notes (Signed)
Patient has recurrent lumbar back pain. Denies recent injury.

## 2015-10-22 ENCOUNTER — Encounter (HOSPITAL_COMMUNITY): Payer: Self-pay

## 2015-10-22 ENCOUNTER — Emergency Department (HOSPITAL_COMMUNITY): Payer: BLUE CROSS/BLUE SHIELD

## 2015-10-22 ENCOUNTER — Emergency Department (HOSPITAL_COMMUNITY)
Admission: EM | Admit: 2015-10-22 | Discharge: 2015-10-22 | Disposition: A | Discharge status: Home / Self Care | Payer: BLUE CROSS/BLUE SHIELD | Attending: Emergency Medicine | Admitting: Emergency Medicine

## 2015-10-22 DIAGNOSIS — I10 Essential (primary) hypertension: Secondary | ICD-10-CM

## 2015-10-22 DIAGNOSIS — M549 Dorsalgia, unspecified: Secondary | ICD-10-CM

## 2015-10-22 HISTORY — DX: Dorsalgia, unspecified: M54.9

## 2015-10-22 LAB — RAPID URINE DRUG SCREEN, HOSP PERFORMED
Amphetamines: POSITIVE — AB
BENZODIAZEPINES: POSITIVE — AB
Barbiturates: NOT DETECTED
Cocaine: NOT DETECTED
OPIATES: NOT DETECTED
Tetrahydrocannabinol: NOT DETECTED

## 2015-10-22 LAB — URINALYSIS, ROUTINE W REFLEX MICROSCOPIC
Bilirubin Urine: NEGATIVE
GLUCOSE, UA: NEGATIVE mg/dL
Hgb urine dipstick: NEGATIVE
KETONES UR: NEGATIVE mg/dL
LEUKOCYTES UA: NEGATIVE
NITRITE: NEGATIVE
PROTEIN: NEGATIVE mg/dL
Specific Gravity, Urine: 1.015 (ref 1.005–1.030)
pH: 6.5 (ref 5.0–8.0)

## 2015-10-22 MED ORDER — PREDNISONE 10 MG (21) PO TBPK: 10.0000 mg | ORAL_TABLET | Freq: Every day | ORAL | 0 refills | Status: DC

## 2015-10-22 MED ORDER — PREDNISONE 50 MG PO TABS
60.0000 mg | ORAL_TABLET | Freq: Once | ORAL | Status: AC
  Administered 2015-10-22: 60 mg via ORAL
  Filled 2015-10-22: qty 1

## 2015-10-22 MED ORDER — METHOCARBAMOL 1000 MG/10ML IJ SOLN
1000.0000 mg | Freq: Once | INTRAMUSCULAR | Status: AC
  Administered 2015-10-22: 1000 mg via INTRAMUSCULAR
  Filled 2015-10-22: qty 10

## 2015-10-22 MED ORDER — HYDROMORPHONE HCL 1 MG/ML IJ SOLN
1.0000 mg | Freq: Once | INTRAMUSCULAR | Status: AC
  Administered 2015-10-22: 1 mg via INTRAMUSCULAR
  Filled 2015-10-22: qty 1

## 2015-10-22 MED ORDER — HYDROCHLOROTHIAZIDE 25 MG PO TABS
25.0000 mg | ORAL_TABLET | Freq: Once | ORAL | Status: AC
  Administered 2015-10-22: 25 mg via ORAL
  Filled 2015-10-22: qty 1

## 2015-10-22 MED ORDER — METOPROLOL TARTRATE 50 MG PO TABS
50.0000 mg | ORAL_TABLET | Freq: Once | ORAL | Status: AC
  Administered 2015-10-22: 50 mg via ORAL
  Filled 2015-10-22: qty 1

## 2015-10-22 MED ORDER — METHOCARBAMOL 1000 MG/10ML IJ SOLN
INTRAMUSCULAR | Status: AC
  Filled 2015-10-22: qty 10

## 2015-10-22 MED ORDER — METOPROLOL TARTRATE 50 MG PO TABS: 50.0000 mg | ORAL_TABLET | Freq: Two times a day (BID) | ORAL | 0 refills | Status: DC

## 2015-10-22 MED ORDER — IBUPROFEN 800 MG PO TABS: 800.0000 mg | ORAL_TABLET | Freq: Three times a day (TID) | ORAL | 0 refills | Status: DC | PRN

## 2015-10-22 MED ORDER — OXYCODONE-ACETAMINOPHEN 5-325 MG PO TABS: 1.0000 | ORAL_TABLET | Freq: Four times a day (QID) | ORAL | 0 refills | Status: DC | PRN

## 2015-10-22 MED ORDER — HYDROCHLOROTHIAZIDE 25 MG PO TABS: 25.0000 mg | ORAL_TABLET | Freq: Every day | ORAL | 0 refills | Status: DC

## 2015-10-22 MED ORDER — METHOCARBAMOL 500 MG PO TABS: 500.0000 mg | ORAL_TABLET | Freq: Three times a day (TID) | ORAL | 0 refills | Status: DC | PRN

## 2015-10-22 MED ORDER — KETOROLAC TROMETHAMINE 30 MG/ML IJ SOLN
60.0000 mg | Freq: Once | INTRAMUSCULAR | Status: AC
  Administered 2015-10-22: 60 mg via INTRAMUSCULAR
  Filled 2015-10-22: qty 2

## 2015-10-22 NOTE — ED Notes (Signed)
Pt ambulated to bathroom and back without assistance and tolerated well.

## 2015-10-22 NOTE — ED Provider Notes (Signed)
By signing my name below, I, Alexander Pope, attest that this documentation has been prepared under the direction and in the presence of Alexander Longenecker N Gray Maugeri, DO. Electronically Signed: Rosario AdieWilliam Andrew Pope, ED Scribe. 10/22/15. 12:43 AM.  TIME SEEN: 12:35 AM  CHIEF COMPLAINT:  Chief Complaint  Patient presents with  . Back Pain   HPI Comments: Alexander Pope is a 35 y.o. male with a PMHx of HTN and chronic back pain, who presents to the Emergency Department complaining of acute on chronic, constant lumbar back pain onset ~2 days. He notes associated numbness to his left leg and foot at baseline, and denies any new acute changes to these issues. Pt has a hx of similar pain over the past ~20 years, but notes that this episode of pain is worst it has ever been. No recent known trauma or injury. His current episode of pain began while walking in a store. Pt's pain is exacerbated with movement, Walking. He takes Naproxen for this issue with minimal relief of his pain. No hx of back surgery or epidural injections. Pt has not been hospitalized in the past for this issue. NKDA. 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 Denies bowel/bladder incontinence, urinary rentention, new numbness or focal weakness, fever, or any other associated symptoms. No IV drug abuse.  PCP: none  ROS: See HPI Constitutional: no fever  Eyes: no drainage  ENT: no runny nose   Cardiovascular: no chest pain  Resp: no SOB  GI: no vomiting GU: no dysuria Integumentary: no rash  Allergy: no hives  Musculoskeletal: no leg swelling  Neurological: no slurred speech ROS otherwise negative  PAST MEDICAL HISTORY/PAST SURGICAL HISTORY:  Past Medical History:  Diagnosis Date  . Anxiety   . Back pain   . Depression   . Hypertension    MEDICATIONS:  Prior to Admission medications   Medication Sig Start Date End Date Taking? Authorizing Provider  benzonatate (TESSALON) 200 MG capsule  Take 1 capsule (200 mg total) by mouth 3 (three) times daily as needed for cough. 05/10/15   Alexander Orlene OchM Neese, NP  dicyclomine (BENTYL) 20 MG tablet Take 1 tablet (20 mg total) by mouth every 6 (six) hours as needed for spasms (abdominal cramping). 10/13/14   Shuvon B Rankin, NP  DULoxetine (CYMBALTA) 60 MG capsule Take 1 capsule (60 mg total) by mouth daily. 10/13/14   Shuvon B Rankin, NP  gabapentin (NEURONTIN) 300 MG capsule Take 1 capsule (300 mg total) by mouth 3 (three) times daily. 10/13/14   Shuvon B Rankin, NP  hydrochlorothiazide (MICROZIDE) 12.5 MG capsule Take 1 capsule (12.5 mg total) by mouth daily. 10/13/14   Shuvon B Rankin, NP  metoprolol tartrate (LOPRESSOR) 25 MG tablet Take 1 tablet (25 mg total) by mouth 2 (two) times daily. 10/13/14   Shuvon B Rankin, NP  Multiple Vitamin (MULTIVITAMIN WITH MINERALS) TABS tablet Take 1 tablet by mouth daily. 10/13/14   Shuvon B Rankin, NP  naproxen (NAPROSYN) 500 MG tablet Take 1 tablet (500 mg total) by mouth 2 (two) times daily as needed (aching, pain, or discomfort). 10/13/14   Shuvon B Rankin, NP  predniSONE (DELTASONE) 10 MG tablet Take 2 tablets (20 mg total) by mouth 2 (two) times daily with a meal. Start 05/01/15 05/10/15   Alexander Orlene OchM Neese, NP  traMADol (ULTRAM) 50 MG tablet Take 1 tablet (50 mg total) by mouth 3 (three) times daily as needed for moderate pain  or severe pain. 10/13/14   Shuvon B Rankin, NP  traZODone (DESYREL) 50 MG tablet Take 1 tablet (50 mg total) by mouth at bedtime as needed for sleep. 10/13/14   Shuvon B Rankin, NP    ALLERGIES:  No Known Allergies  SOCIAL HISTORY:  Social History  Substance Use Topics  . Smoking status: Current Every Day Smoker    Packs/day: 1.00    Types: Cigarettes  . Smokeless tobacco: Never Used  . Alcohol use No    FAMILY HISTORY: History reviewed. No pertinent family history.  EXAM: BP (!) 160/120 (BP Location: Left Arm)   Pulse 104   Temp 98.7 F (37.1 C) (Oral)   Resp 24   Ht 5\' 11"  (1.803 m)   Wt  210 lb (95.3 kg)   SpO2 98%   BMI 29.29 kg/m  CONSTITUTIONAL: Alert and oriented and responds appropriately to questions. Uncomfortable appearing, well-nourished, afebrile and nontoxic HEAD: Normocephalic EYES: Conjunctivae clear, PERRL ENT: normal nose; no rhinorrhea; moist mucous membranes NECK: Supple, no meningismus, no LAD  CARD: RRR; S1 and S2 appreciated; no murmurs, no clicks, no rubs, no gallops RESP: Normal chest excursion without splinting or tachypnea; breath sounds clear and equal bilaterally; no wheezes, no rhonchi, no rales, no hypoxia or respiratory distress, speaking full sentences ABD/GI: Normal bowel sounds; non-distended; soft, non-tender, no rebound, no guarding, no peritoneal signs BACK:  The back appears normal but is tender to palpation over the lower lumbar spine w/o step-off or deformity, there is no CVA tenderness. EXT: Normal ROM in all joints; non-tender to palpation; no edema; normal capillary refill; no cyanosis, no calf tenderness or swelling    SKIN: Normal color for age and race; warm; no rash NEURO: Moves all extremities equally, sensation decreased in LLE throughout L5-S1 distribution, but otherwise sensation to light touch intact, cranial nerves II through XII intact PSYCH: The patient's mood and manner are appropriate. Grooming and personal hygiene are appropriate.  MEDICAL DECISION MAKING: Patient here with back pain. Has history of chronic back pain and states this is an acute exacerbation. He is hypertensive and states he has been off his metoprolol and hydrochlorothiazide for several months. PCP is Dr. Sudie Bailey but he has not followed up with him in some time. No red flag symptoms. He is a recovering addict. We will try to avoid narcotics and patient is agreeable to this plan. We'll give IM Robaxin, Toradol, prednisone and reassess. Patient reports he is unable to ambulate because of pain.  ED PROGRESS: Patient reports no improvement after Toradol,  prednisone, Robaxin. Patient's family at bedside and patient are requesting narcotic pain medication for better pain control. We have had a long discussion about giving him narcotics with a history of substance abuse in recovery. He states that he has a good support system in place and if needed his family can give him pain medication and he would not have access to them. Given I am unable to get patient to walk at this time because of pain, will give a dose of IM pain medication and reassess.    Patient received IM Dilaudid and is now ambulatory. We'll discharge  with short prescription for medications to take for his back pain. Have strongly encouraged outpatient follow-up. Family member at bedside states that they will keep this medication in only given as needed. Discussed at length return precautions. He verbalizes understanding and is comfortable with this plan.   Blood pressure has also improved. Denies headache, vision changes, chest pain  or shortness of breath, new neurologic deficits. We'll refill his metoprolol, hydrochlorothiazide. Recommend close follow-up with his PCP for this as well.  At this time, I do not feel there is any life-threatening condition present. I have reviewed and discussed all results (EKG, imaging, lab, urine as appropriate), exam findings with patient/family. I have reviewed nursing notes and appropriate previous records.  I feel the patient is safe to be discharged home without further emergent workup and can continue workup as an outpatient as needed. Discussed usual and customary return precautions. Patient/family verbalize understanding and are comfortable with this plan.  Outpatient follow-up has been provided. All questions have been answered.     I personally performed the services described in this documentation, which was scribed in my presence. The recorded information has been reviewed and is accurate.      Layla Maw Zailyn Rowser, DO 10/22/15 [phone]

## 2015-11-02 ENCOUNTER — Other Ambulatory Visit: Payer: Self-pay | Admitting: Neurosurgery

## 2015-11-02 DIAGNOSIS — M48062 Spinal stenosis, lumbar region with neurogenic claudication: Secondary | ICD-10-CM

## 2015-11-02 DIAGNOSIS — M5416 Radiculopathy, lumbar region: Secondary | ICD-10-CM

## 2015-11-02 DIAGNOSIS — G9519 Other vascular myelopathies: Secondary | ICD-10-CM

## 2015-11-14 ENCOUNTER — Ambulatory Visit
Admission: RE | Admit: 2015-11-14 | Discharge: 2015-11-14 | Disposition: A | Discharge status: Home / Self Care | Payer: BLUE CROSS/BLUE SHIELD | Source: Ambulatory Visit | Attending: Neurosurgery | Admitting: Neurosurgery

## 2015-11-14 DIAGNOSIS — M48062 Spinal stenosis, lumbar region with neurogenic claudication: Secondary | ICD-10-CM | POA: Insufficient documentation

## 2015-11-14 DIAGNOSIS — G9519 Other vascular myelopathies: Secondary | ICD-10-CM

## 2015-11-14 DIAGNOSIS — M5416 Radiculopathy, lumbar region: Secondary | ICD-10-CM | POA: Insufficient documentation

## 2015-11-14 DIAGNOSIS — M5127 Other intervertebral disc displacement, lumbosacral region: Secondary | ICD-10-CM | POA: Insufficient documentation

## 2015-11-14 DIAGNOSIS — M4317 Spondylolisthesis, lumbosacral region: Secondary | ICD-10-CM | POA: Insufficient documentation

## 2016-11-26 ENCOUNTER — Emergency Department (HOSPITAL_COMMUNITY)
Admission: EM | Admit: 2016-11-26 | Discharge: 2016-11-26 | Discharge status: Against Medical Advice | Payer: Medicaid Other | Attending: Emergency Medicine | Admitting: Emergency Medicine

## 2016-11-26 ENCOUNTER — Encounter (HOSPITAL_COMMUNITY): Payer: Self-pay | Admitting: Emergency Medicine

## 2016-11-26 DIAGNOSIS — R1011 Right upper quadrant pain: Secondary | ICD-10-CM | POA: Insufficient documentation

## 2016-11-26 DIAGNOSIS — R112 Nausea with vomiting, unspecified: Secondary | ICD-10-CM | POA: Insufficient documentation

## 2016-11-26 DIAGNOSIS — Z5321 Procedure and treatment not carried out due to patient leaving prior to being seen by health care provider: Secondary | ICD-10-CM | POA: Diagnosis not present

## 2016-11-26 LAB — COMPREHENSIVE METABOLIC PANEL
ALBUMIN: 4.6 g/dL (ref 3.5–5.0)
ALK PHOS: 70 U/L (ref 38–126)
ALT: 24 U/L (ref 17–63)
ANION GAP: 9 (ref 5–15)
AST: 22 U/L (ref 15–41)
BILIRUBIN TOTAL: 0.7 mg/dL (ref 0.3–1.2)
BUN: 14 mg/dL (ref 6–20)
CALCIUM: 9.7 mg/dL (ref 8.9–10.3)
CO2: 29 mmol/L (ref 22–32)
CREATININE: 1 mg/dL (ref 0.61–1.24)
Chloride: 104 mmol/L (ref 101–111)
GFR calc Af Amer: >60 mL/min (ref >60)
GFR calc non Af Amer: >60 mL/min (ref >60)
GLUCOSE: 107 mg/dL — AB (ref 65–99)
Potassium: 4.6 mmol/L (ref 3.5–5.1)
SODIUM: 142 mmol/L (ref 135–145)
TOTAL PROTEIN: 8.2 g/dL — AB (ref 6.5–8.1)

## 2016-11-26 LAB — URINALYSIS, ROUTINE W REFLEX MICROSCOPIC
BILIRUBIN URINE: NEGATIVE
Glucose, UA: NEGATIVE mg/dL
HGB URINE DIPSTICK: NEGATIVE
KETONES UR: NEGATIVE mg/dL
Leukocytes, UA: NEGATIVE
NITRITE: NEGATIVE
Protein, ur: NEGATIVE mg/dL
SPECIFIC GRAVITY, URINE: 1.014 (ref 1.005–1.030)
pH: 8 (ref 5.0–8.0)

## 2016-11-26 LAB — CBC
HCT: 42.8 % (ref 39.0–52.0)
HEMOGLOBIN: 14.6 g/dL (ref 13.0–17.0)
MCH: 30.4 pg (ref 26.0–34.0)
MCHC: 34.1 g/dL (ref 30.0–36.0)
MCV: 89 fL (ref 78.0–100.0)
Platelets: 174 10*3/uL (ref 150–400)
RBC: 4.81 MIL/uL (ref 4.22–5.81)
RDW: 13.9 % (ref 11.5–15.5)
WBC: 14.3 10*3/uL — ABNORMAL HIGH (ref 4.0–10.5)

## 2016-11-26 LAB — LIPASE, BLOOD: Lipase: 28 U/L (ref 11–51)

## 2016-11-26 NOTE — ED Triage Notes (Signed)
PT c/o right upper quadrant abdominal pain with nausea and vomiting that started this am with cold chills. PT also c/o increased urinary urgency with decreased output.

## 2016-11-26 NOTE — ED Notes (Signed)
No answer in waiting room x3

## 2017-03-03 ENCOUNTER — Encounter: Payer: Self-pay | Admitting: Neurology

## 2017-03-03 ENCOUNTER — Ambulatory Visit: Payer: Medicaid Other | Admitting: Neurology

## 2017-03-03 VITALS — BP 156/112 | HR 107 | Ht 70.0 in | Wt 206.0 lb

## 2017-03-03 DIAGNOSIS — F419 Anxiety disorder, unspecified: Secondary | ICD-10-CM | POA: Diagnosis not present

## 2017-03-03 DIAGNOSIS — R6889 Other general symptoms and signs: Secondary | ICD-10-CM | POA: Diagnosis not present

## 2017-03-03 DIAGNOSIS — F1190 Opioid use, unspecified, uncomplicated: Secondary | ICD-10-CM

## 2017-03-03 DIAGNOSIS — G479 Sleep disorder, unspecified: Secondary | ICD-10-CM | POA: Diagnosis not present

## 2017-03-03 DIAGNOSIS — F17200 Nicotine dependence, unspecified, uncomplicated: Secondary | ICD-10-CM

## 2017-03-03 NOTE — Progress Notes (Signed)
Subjective:    Patient ID: Alexander Pope is a 37 y.o. male.  HPI     Huston Foley, MD, PhD [facility] [address] [address] [place], [place] [address]  Dear Dr. Sudie Bailey,   I saw your patient, Aubry Tucholski, upon your kind request in my neurologic clinic today for initial consultation of his sleep disturbance, in particular difficulty with sleep onset and sleep maintenance. He also reports recurrent episodes of possible sleep paralysis. The patient is unaccompanied today. As you know, Mr. Keetch is a 37 year old right-handed gentleman with an underlying medical history of hyperlipidemia, ADD, low back pain, smoking, mood disorder and history of alcohol dependence, who reports vivid dreams, disturbing dreams, waking up inferior, also fear of going to bed at night. He has by self admission not been drinking any alcohol since September 2016 and admits that he has abused opiates in the past. He smokes one pack per day. He tried to quit in the past but currently is not actually trying to quit. For his sleep onset difficulties he has been on circumflex well before. He is currently no longer on Cymbalta or gabapentin. He had back surgery about a year ago. He has been taking melatonin. He has taken up to 15 mg. Currently he takes 9 mg each night. He also admits to taking a lot of Benadryl, 25 mg strength, he has taken up to 20 pills in a single night. He will typically take 6 pills at bedtime and possibly another 4 or 6 pills later.  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 He has a mood disorder but is unclear as to his mood related diagnosis. I reviewed your office note from 02/24/2017, which you kindly included. Of note, he is on multiple psychotropic and potentially sedating medications including alprazolam, oxycodone 10 mg strength one pill up to 5 times a day as needed, trazodone 50 mg strength 3 pills at night, and he also takes  amphetamine-dextroamphetamine 30 mg twice daily.  He has never had a sleep study. He is not sure, if he snores. His Epworth sleepiness score is 3 out of 24, fatigue score is 20 out of 63.  His Past Medical History Is Significant For: Past Medical History:  Diagnosis Date  . Anxiety   . Back pain   . Depression   . Hypertension     His Past Surgical History Is Significant For: Past Surgical History:  Procedure Laterality Date  . BACK SURGERY      His Family History Is Significant For: No family history on file.  His Social History Is Significant For: Social History   Socioeconomic History  . Marital status: Single    Spouse name: None  . Number of children: None  . Years of education: None  . Highest education level: None  Social Needs  . Financial resource strain: None  . Food insecurity - worry: None  . Food insecurity - inability: None  . Transportation needs - medical: None  . Transportation needs - non-medical: None  Occupational History  . None  Tobacco Use  . Smoking status: Current Every Day Smoker    Packs/day: 1.00    Types: Cigarettes  . Smokeless tobacco: Never Used  Substance and Sexual Activity  . Alcohol use: No    Alcohol/week: 3.6 oz    Types: 6 Cans of beer per week  . Drug use: No    Comment: "Molly"  . Sexual activity: Not Currently  Other Topics Concern  . None  Social History Narrative  . None    His Allergies Are:  No Known Allergies:   His Current Medications Are:  Outpatient Encounter Medications as of 03/03/2017  Medication Sig  . ALPRAZolam (XANAX) 1 MG tablet Take 1 mg by mouth at bedtime as needed for anxiety.  Marland Kitchen. amphetamine-dextroamphetamine (ADDERALL) 30 MG tablet Take 30 mg by mouth 2 (two) times daily.  . DULoxetine (CYMBALTA) 60 MG capsule Take 1 capsule (60 mg total) by mouth daily.  Marland Kitchen. gabapentin (NEURONTIN) 300 MG capsule Take 1 capsule (300 mg total) by mouth 3 (three) times daily.  . hydrochlorothiazide  (HYDRODIURIL) 25 MG tablet Take 1 tablet (25 mg total) by mouth daily.  Marland Kitchen. ibuprofen (ADVIL,MOTRIN) 800 MG tablet Take 1 tablet (800 mg total) by mouth every 8 (eight) hours as needed for mild pain.  . metoprolol (LOPRESSOR) 50 MG tablet Take 1 tablet (50 mg total) by mouth 2 (two) times daily.  Marland Kitchen. oxyCODONE-acetaminophen (PERCOCET) 10-325 MG tablet Take 1 tablet by mouth 5 (five) times daily as needed for pain.  . traZODone (DESYREL) 50 MG tablet Take 150 mg by mouth at bedtime.   . [DISCONTINUED] methocarbamol (ROBAXIN) 500 MG tablet Take 1 tablet (500 mg total) by mouth every 8 (eight) hours as needed for muscle spasms.  . [DISCONTINUED] Multiple Vitamin (MULTIVITAMIN WITH MINERALS) TABS tablet Take 1 tablet by mouth daily.  . [DISCONTINUED] oxyCODONE-acetaminophen (PERCOCET/ROXICET) 5-325 MG tablet Take 1-2 tablets by mouth every 6 (six) hours as needed.  . [DISCONTINUED] predniSONE (STERAPRED UNI-PAK 21 TAB) 10 MG (21) TBPK tablet Take 1 tablet (10 mg total) by mouth daily. Take as directed  . [DISCONTINUED] traZODone (DESYREL) 50 MG tablet Take 1 tablet (50 mg total) by mouth at bedtime as needed for sleep.   No facility-administered encounter medications on file as of 03/03/2017.   :  Review of Systems:  Out of a complete 14 point review of systems, all are reviewed and negative with the exception of these symptoms as listed below:  Review of Systems  Neurological:       Pt presents today to discuss his insomnia. Pt is afraid to go to sleep. He thinks he has sleep paralysis.  Epworth Sleepiness Scale 0= would never doze 1= slight chance of dozing 2= moderate chance of dozing 3= high chance of dozing  Sitting and reading: 0 Watching TV: 1 Sitting inactive in a public place (ex. Theater or meeting): 0 As a passenger in a car for an hour without a break: 1 Lying down to rest in the afternoon: 0 Sitting and talking to someone: 0 Sitting quietly after lunch (no alcohol): 1 In a car,  while stopped in traffic: 0 Total: 3     Objective:  Neurological Exam  Physical Exam Physical Examination:   Vitals:   03/03/17 1508  BP: (!) 156/112  Pulse: (!) 107    General Examination: The patient is a very pleasant 37 y.o. male in no acute distress. He appears restless and paces around the room at times.    HEENT: Normocephalic, atraumatic, pupils are equal, round and reactive to light and accommodation. Extraocular tracking is good without limitation to gaze excursion or nystagmus noted. Normal smooth pursuit is noted. Hearing is grossly intact. Face is symmetric with normal facial animation and normal facial sensation. Speech is clear with no dysarthria noted. There is no hypophonia. There is no lip, neck/head, jaw or voice tremor. Neck is supple with full  range of passive and active motion. There are no carotid bruits on auscultation. Oropharynx exam reveals: mild mouth dryness, adequate dental hygiene and moderate airway crowding, due smaller airway entry, tonsils are 2+. Mallampati is class II. Tongue protrudes centrally and palate elevates symmetrically. Neck size is 17 inches.   Chest: Clear to auscultation without wheezing, rhonchi or crackles noted.  Heart: S1+S2+0, regular and normal without murmurs, rubs or gallops noted.   Abdomen: Soft, non-tender and non-distended with normal bowel sounds appreciated on auscultation.  Extremities: There is no obvious abnormality.   Skin: Warm and dry without trophic changes noted.  Musculoskeletal: exam reveals no obvious joint deformities, tenderness or joint swelling or erythema.   Neurologically:  Mental status: The patient is awake, alert and oriented in all 4 spheres, needs redirection.  Cranial nerves II - XII are as described above under HEENT exam. In addition: shoulder shrug is normal with equal shoulder height noted. Motor exam: Normal bulk, strength and tone is noted. There is no drift, tremor or rebound. Romberg is  negative. Reflexes are 1+ throughout. Fine motor skills and coordination: grossly intact.  Cerebellar testing: No dysmetria or intention tremor. There is no truncal or gait ataxia.  Sensory exam: intact to light touch in the upper and lower extremities.  Gait, station and balance: He stands easily. No veering to one side is noted. No leaning to one side is noted. Posture is age-appropriate and stance is narrow based. Gait shows normal stride length and normal pace. No problems turning are noted.               Assessment and Plan:   In summary, SAID RUEB is a pleasant 37 y.o.-year old male with an underlying medical history of hyperlipidemia, ADD, low back pain, smoking, mood disorder and history of alcohol dependence, who presents for sleep consultation. The description of his vivid dreams and fears at night is not in keeping with sleep paralysis. Nevertheless, there are some concerns. We can certainly proceed with a sleep study to rule out an organic cause of his sleep disturbance. I am highly concerned about his overuse of Benadryl. I advised him strongly not to use more than what is written on the bottle and only as needed, not every night, certainly not 6 or more, and up to 20 pills (!) per night. This is highly concerning to me. He (by self admission) has abused prescription opiates before. He's currently on oxycodone and takes 3 or 4 pills per day. He also has a history of alcohol abuse and taking benzodiazepine medication in a regular basis, is concerning to me as well.  He has ongoing issues with anxiety. He is encouraged to see his psychiatrist and counselor again. He is advised to discuss this with you as well. We will proceed with a sleep study and call him with the results. If he has strep sleep apnea, would like for him to consider CPAP therapy. For chronic insomnia, he may be a candidate for cognitive behavioral therapy. This would be through his mental health providers.  I will see him  back after his sleep study is completed. I answered all his questions today and he was in agreement. Thank you very much for allowing me to participate in the care of this patient. If I can be of any further assistance to you please do not hesitate to call me at [phone].  Sincerely,   Huston Foley, MD, PhD

## 2017-03-03 NOTE — Patient Instructions (Addendum)
Please do not take benadryl more than 2 pills per night, and not every night. Taking more than is listed on the bottle, can put you in harms way and cause serious consequences and side effects, liver damage, problems breathing, etc.   Please consider going back to your psychiatrist and counselor, Dr. Sudie BaileyKnowlton can make a referral if needed.   Based on your symptoms and your exam I believe we should look for an underlying organic sleep disorder, such as obstructive sleep apnea/OSA  If you have more than mild OSA, I we can consider treatment with CPAP.  Please remember, the risks and ramifications of moderate to severe obstructive sleep apnea or OSA are: Cardiovascular disease, including congestive heart failure, stroke, difficult to control hypertension, arrhythmias, and even type 2 diabetes has been linked to untreated OSA. Sleep apnea causes disruption of sleep and sleep deprivation in most cases, which, in turn, can cause recurrent headaches, problems with memory, mood, concentration, focus, and vigilance. Most people with untreated sleep apnea report excessive daytime sleepiness, which can affect their ability to drive. Please do not drive if you feel sleepy.   Our sleep lab administrative assistant will call you to schedule your sleep study. If you don't hear back from her by about 2 weeks from now, please feel free to call her at [phone]. This is her direct line and please leave a message with your phone number to call back if you get the voicemail box. She will call back as soon as possible.

## 2017-03-11 ENCOUNTER — Telehealth: Payer: Self-pay | Admitting: Neurology

## 2017-03-11 NOTE — Telephone Encounter (Signed)
We have attempted to call the patient two times to schedule sleep study. Patient has been unavailable at the phone numbers we have on file, and has not returned our calls. At this time, we will send a letter asking patient to please contact the sleep lab.  °

## 2019-02-08 ENCOUNTER — Ambulatory Visit
Admission: EM | Admit: 2019-02-08 | Discharge: 2019-02-08 | Disposition: A | Discharge status: Home / Self Care | Payer: Medicaid Other | Attending: Emergency Medicine | Admitting: Emergency Medicine

## 2019-02-08 DIAGNOSIS — W450XXA Nail entering through skin, initial encounter: Secondary | ICD-10-CM

## 2019-02-08 DIAGNOSIS — S91331A Puncture wound without foreign body, right foot, initial encounter: Secondary | ICD-10-CM

## 2019-02-08 DIAGNOSIS — S99921A Unspecified injury of right foot, initial encounter: Secondary | ICD-10-CM

## 2019-02-08 DIAGNOSIS — Z23 Encounter for immunization: Secondary | ICD-10-CM | POA: Diagnosis not present

## 2019-02-08 MED ORDER — TETANUS-DIPHTH-ACELL PERTUSSIS 5-2.5-18.5 LF-MCG/0.5 IM SUSP
0.5000 mL | Freq: Once | INTRAMUSCULAR | Status: AC
  Administered 2019-02-08: 0.5 mL via INTRAMUSCULAR

## 2019-02-08 NOTE — Discharge Instructions (Addendum)
Tetanus shot was given in office Advised patient to take Tylenol for pain management Advised patient to clean wound with soap and water and apply mupirocin ointment To return for worsening of symptoms

## 2019-02-08 NOTE — ED Provider Notes (Signed)
[facility]    CSN: [id number] Arrival date & time: 02/08/19  1715      History   Chief Complaint Chief Complaint  Patient presents with  . Foot Injury    HPI Alexander Pope is a 38 y.o. adult.   Alexander Pope 38 years old man presented to the urgent care with a complaint of right foot injury.  Reported a nail went through his shoes and puncture his right dorsal foot.  Has not tried any medication.  Bleeding is controlled.  Reported pain is 0 at this time.  Report no record of tetanus shot in the last 10 years.  Denies nausea, vomiting, diarrhea, abdominal pain, confusion, body aches,  chest pain, chest tightness.   Foot Injury   Past Medical History:  Diagnosis Date  . Anxiety   . Back pain   . Depression   . Hypertension     Patient Active Problem List   Diagnosis Date Noted  . Alcohol dependence with uncomplicated withdrawal ([place])   . Opioid dependence with opioid-induced mood disorder ([place])   . Alcohol dependence ([place]) 10/10/2014  . Opioid dependence ([place]) 10/10/2014  . Substance induced mood disorder ([place]) 10/10/2014    Past Surgical History:  Procedure Laterality Date  . BACK SURGERY         Home Medications    Prior to Admission medications   Medication Sig Start Date End Date Taking? Authorizing Provider  traZODone (DESYREL) 50 MG tablet Take 150 mg by mouth at bedtime.    Yes [provider]  ALPRAZolam Duanne Moron) 1 MG tablet Take 1 mg by mouth at bedtime as needed for anxiety.    [provider]  amphetamine-dextroamphetamine (ADDERALL) 30 MG tablet Take 30 mg by mouth 2 (two) times daily.    [provider]  DiphenhydrAMINE HCl (BENADRYL PO) Take by mouth.    [provider]  DULoxetine (CYMBALTA) 60 MG capsule Take 1 capsule (60 mg total) by mouth daily. 10/13/14   Rankin, Shuvon B, NP  gabapentin (NEURONTIN) 300 MG capsule Take 1 capsule (300 mg total) by mouth 3 (three) times daily. 10/13/14   Rankin,  Shuvon B, NP  hydrochlorothiazide (HYDRODIURIL) 25 MG tablet Take 1 tablet (25 mg total) by mouth daily. 10/22/15   Ward, Delice Bison, DO  ibuprofen (ADVIL,MOTRIN) 800 MG tablet Take 1 tablet (800 mg total) by mouth every 8 (eight) hours as needed for mild pain. 10/22/15   Ward, Delice Bison, DO  metoprolol (LOPRESSOR) 50 MG tablet Take 1 tablet (50 mg total) by mouth 2 (two) times daily. 10/22/15   Ward, Delice Bison, DO  oxyCODONE-acetaminophen (PERCOCET) 10-325 MG tablet Take 1 tablet by mouth 5 (five) times daily as needed for pain.    [provider]    Family History Family History  Problem Relation Age of Onset  . Stroke Mother     Social History Social History   Tobacco Use  . Smoking status: Current Every Day Smoker    Packs/day: 1.00    Types: Cigarettes  . Smokeless tobacco: Never Used  Substance Use Topics  . Alcohol use: No    Alcohol/week: 6.0 standard drinks    Types: 6 Cans of beer per week  . Drug use: No    Types: Cocaine, Benzodiazepines    Comment: "Molly"     Allergies   Patient has no known allergies.   Review of Systems Review of Systems  Constitutional: Negative.   Respiratory: Negative.  Cardiovascular: Negative.   Musculoskeletal: Negative.   Skin: Positive for wound.     Physical Exam Triage Vital Signs ED Triage Vitals  Enc Vitals Group     BP 02/08/19 1731 (!) 180/139     Pulse Rate 02/08/19 1731 (!) 119     Resp 02/08/19 1731 18     Temp 02/08/19 1731 98.5 F (36.9 C)     Temp src --      SpO2 02/08/19 1731 95 %     Weight --      Height --      Head Circumference --      Peak Flow --      Pain Score 02/08/19 1725 0     Pain Loc --      Pain Edu? --      Excl. in GC? --    No data found.  Updated Vital Signs BP (!) 180/139 Comment: pt is out of blood pressure medication  Pulse (!) 119   Temp 98.5 F (36.9 C)   Resp 18   SpO2 95%   Visual Acuity Right Eye Distance:   Left Eye Distance:   Bilateral Distance:     Right Eye Near:   Left Eye Near:    Bilateral Near:     Physical Exam Constitutional:      General: She is not in acute distress.    Appearance: Normal appearance. She is normal weight. She is not ill-appearing or toxic-appearing.  Cardiovascular:     Rate and Rhythm: Normal rate and regular rhythm.     Pulses: Normal pulses.     Heart sounds: Normal heart sounds.  Pulmonary:     Effort: Pulmonary effort is normal.     Breath sounds: Normal breath sounds.  Musculoskeletal:        General: Signs of injury present. No swelling or tenderness. Normal range of motion.     Comments: Right foot: superficial puncture injury of the right foot.  Full range of motion present with no pain.  Full strength.  No obvious deformity when compared to the left foot. no  blood vessel, and nerve were puncture  Skin:    General: Skin is warm and dry.     Findings: No erythema or rash.  Neurological:     Mental Status: She is alert.      UC Treatments / Results  Labs (all labs ordered are listed, but only abnormal results are displayed) Labs Reviewed - No data to display  EKG   Radiology No results found.  Procedures Procedures (including critical care time)  Medications Ordered in UC Medications  Tdap (BOOSTRIX) injection 0.5 mL (0.5 mLs Intramuscular Given 02/08/19 1743)    Initial Impression / Assessment and Plan / UC Course  I have reviewed the triage vital signs and the nursing notes.  Pertinent labs & imaging results that were available during my care of the patient were reviewed by me and considered in my medical decision making (see chart for details).    atient stable for discharge.  Benign physical exam.  tetanus immunization was given in office.  To return for worsening of symptoms.   Final Clinical Impressions(s) / UC Diagnoses   Final diagnoses:  Injury, foot, right, initial encounter     Discharge Instructions     Tetanus shot was given in office Advised  patient to take Tylenol for pain management Advised patient to clean wound with soap and water and apply mupirocin ointment To return  for worsening of symptoms    ED Prescriptions    None     PDMP not reviewed this encounter.   Durward Parcel, FNP 02/08/19 1759

## 2019-02-08 NOTE — ED Triage Notes (Signed)
Pt stepped on nail with right foot, went through shoe and punctured foot. Bleeding controled, pt wants tetanus shot

## 2019-07-04 ENCOUNTER — Encounter (HOSPITAL_COMMUNITY): Payer: Self-pay | Admitting: *Deleted

## 2019-07-04 ENCOUNTER — Emergency Department (HOSPITAL_COMMUNITY)
Admission: EM | Admit: 2019-07-04 | Discharge: 2019-07-05 | Disposition: A | Discharge status: Home / Self Care | Payer: Medicaid Other | Attending: Emergency Medicine | Admitting: Emergency Medicine

## 2019-07-04 ENCOUNTER — Other Ambulatory Visit: Payer: Self-pay

## 2019-07-04 DIAGNOSIS — Z79899 Other long term (current) drug therapy: Secondary | ICD-10-CM | POA: Diagnosis not present

## 2019-07-04 DIAGNOSIS — I10 Essential (primary) hypertension: Secondary | ICD-10-CM | POA: Diagnosis not present

## 2019-07-04 DIAGNOSIS — F17210 Nicotine dependence, cigarettes, uncomplicated: Secondary | ICD-10-CM | POA: Insufficient documentation

## 2019-07-04 DIAGNOSIS — R519 Headache, unspecified: Secondary | ICD-10-CM | POA: Diagnosis present

## 2019-07-04 DIAGNOSIS — R42 Dizziness and giddiness: Secondary | ICD-10-CM | POA: Diagnosis not present

## 2019-07-04 NOTE — ED Triage Notes (Addendum)
Pt c/o high blood pressure over the last week. Pt reports he hasn't been taking his BP medication. Pt also c/o headache and dizziness.

## 2019-07-05 ENCOUNTER — Emergency Department (HOSPITAL_COMMUNITY): Payer: Medicaid Other

## 2019-07-05 LAB — CBC WITH DIFFERENTIAL/PLATELET
Abs Immature Granulocytes: 0.02 10*3/uL (ref 0.00–0.07)
Basophils Absolute: 0.1 10*3/uL (ref 0.0–0.1)
Basophils Relative: 1 %
Eosinophils Absolute: 0.2 10*3/uL (ref 0.0–0.5)
Eosinophils Relative: 2 %
HCT: 46.5 % (ref 39.0–52.0)
Hemoglobin: 15.6 g/dL (ref 13.0–17.0)
Immature Granulocytes: 0 %
Lymphocytes Relative: 19 %
Lymphs Abs: 1.9 10*3/uL (ref 0.7–4.0)
MCH: 32 pg (ref 26.0–34.0)
MCHC: 33.5 g/dL (ref 30.0–36.0)
MCV: 95.3 fL (ref 80.0–100.0)
Monocytes Absolute: 0.7 10*3/uL (ref 0.1–1.0)
Monocytes Relative: 7 %
Neutro Abs: 7.3 10*3/uL (ref 1.7–7.7)
Neutrophils Relative %: 71 %
Platelets: 206 10*3/uL (ref 150–400)
RBC: 4.88 MIL/uL (ref 4.22–5.81)
RDW: 13.7 % (ref 11.5–15.5)
WBC: 10.2 10*3/uL (ref 4.0–10.5)
nRBC: 0 % (ref 0.0–0.2)

## 2019-07-05 LAB — BASIC METABOLIC PANEL
Anion gap: 12 (ref 5–15)
BUN: 17 mg/dL (ref 6–20)
CO2: 26 mmol/L (ref 22–32)
Calcium: 9.2 mg/dL (ref 8.9–10.3)
Chloride: 100 mmol/L (ref 98–111)
Creatinine, Ser: 1.13 mg/dL (ref 0.61–1.24)
GFR calc Af Amer: >60 mL/min (ref >60)
GFR calc non Af Amer: >60 mL/min (ref >60)
Glucose, Bld: 99 mg/dL (ref 70–99)
Potassium: 4 mmol/L (ref 3.5–5.1)
Sodium: 138 mmol/L (ref 135–145)

## 2019-07-05 LAB — ETHANOL: Alcohol, Ethyl (B): <10 mg/dL (ref <10)

## 2019-07-05 MED ORDER — ACETAMINOPHEN 325 MG PO TABS
650.0000 mg | ORAL_TABLET | Freq: Once | ORAL | Status: AC
  Administered 2019-07-05: 650 mg via ORAL
  Filled 2019-07-05: qty 2

## 2019-07-05 MED ORDER — METOPROLOL TARTRATE 50 MG PO TABS
50.0000 mg | ORAL_TABLET | Freq: Once | ORAL | Status: AC
  Administered 2019-07-05: 50 mg via ORAL
  Filled 2019-07-05: qty 1

## 2019-07-05 MED ORDER — LISINOPRIL-HYDROCHLOROTHIAZIDE 20-12.5 MG PO TABS
1.0000 | ORAL_TABLET | Freq: Every day | ORAL | 0 refills | Status: DC
Start: 2019-07-05 — End: 2022-03-23

## 2019-07-05 MED ORDER — HYDROCHLOROTHIAZIDE 25 MG PO TABS
25.0000 mg | ORAL_TABLET | Freq: Every day | ORAL | Status: DC
  Administered 2019-07-05: 25 mg via ORAL
  Filled 2019-07-05: qty 1

## 2019-07-05 MED ORDER — METOPROLOL TARTRATE 50 MG PO TABS: 50.0000 mg | ORAL_TABLET | Freq: Two times a day (BID) | ORAL | 0 refills | Status: DC

## 2019-07-05 MED ORDER — LISINOPRIL 10 MG PO TABS
20.0000 mg | ORAL_TABLET | Freq: Once | ORAL | Status: AC
  Administered 2019-07-05: 20 mg via ORAL
  Filled 2019-07-05: qty 2

## 2019-07-05 NOTE — ED Notes (Signed)
Pt standing  in hallway waiting to leave unable to get new set of vitals or sign

## 2019-07-05 NOTE — Congregational Nurse Program (Signed)
   Called client to follow up after his ER visit at recommendation of [facility] Administrator, sports.  Client reports he did go and they performed CT and labs and did give him prescriptions for metoprolol as well as Lisinopril/HCTZ. Client confirms that he did get those filled this morning.  Encouraged client to take his medications daily at a consistent time and to call Dr. Sudie Bailey for a follow up. Client states he will call. Discussed that if is unable to continue with Dr. Sudie Bailey due to scheduling issues, he may contact [facility] and we would help provide him with a list of providers that are taking Medicaid. Client states understanding. He reports he is feeling well today. Discussed that client may return to [employer] during office hours for a blood pressure check. Client reports her purchased a wrist cuff and discussed that he can bring it by any time to assure it is working properly and compare blood pressure readings. Client states understanding.  Alexander Pope given [facility] operating hours as he lives in this apartment complex is aware of our location for any needed future assistance. Client is agreeable.    Alexander Nodal RN  [facility]

## 2019-07-05 NOTE — Discharge Instructions (Addendum)
As discussed, it is important that you take your blood pressure medications daily as directed.  Would also be beneficial to your help if you stop smoking.  Call Dr. Michelle Nasuti office to arrange a follow-up appointment.  Return to the emergency department for any worsening symptoms.

## 2019-07-05 NOTE — ED Provider Notes (Signed)
[facility] EMERGENCY DEPARTMENT Provider Note   CSN: [id number] Arrival date & time: 07/04/19  1757     History Chief Complaint  Patient presents with  . Hypertension    Alexander Pope is a 39 y.o. adult.  HPI      Alexander Pope is a 39 y.o. adult with a medical history significant for HTN, who presents to the Emergency Department complaining of headache, dizziness and elevated blood pressure.  Symptoms been present for 1 week.  He states that he is supposed to take metoprolol and lisinopril/HCTZ but admits that he does not take his medications as directed.  Over the course of a week, he describes a gradually worsening headache that his "to the top of my head and forehead."  Headache is described as pounding sensation and dizziness with position change.  States he has been unable to see his primary care provider, Dr. Karie Kirks, and was seen earlier today at the [facility] clinic and noted to have a blood pressure of 180/130 and heart rate of 114. he was advised to come to the emergency department for further evaluation.  He also endorses some increased confusion since yesterday and difficulty with memory recall.  He denies numbness or weakness of his face or extremities, chest pain, shortness of breath, visual changes and vomiting.  He denies marijuana, cocaine, or other illicit drugs.  He is noted to have a history of substance abuse and alcohol dependence.    Past Medical History:  Diagnosis Date  . Anxiety   . Back pain   . Depression   . Hypertension     Patient Active Problem List   Diagnosis Date Noted  . Alcohol dependence with uncomplicated withdrawal ([place])   . Opioid dependence with opioid-induced mood disorder ([place])   . Alcohol dependence ([place]) 10/10/2014  . Opioid dependence ([place]) 10/10/2014  . Substance induced mood disorder ([place]) 10/10/2014    Past Surgical History:  Procedure Laterality Date  . BACK SURGERY         Family History  Problem Relation Age of  Onset  . Stroke Mother     Social History   Tobacco Use  . Smoking status: Current Every Day Smoker    Packs/day: 1.00    Types: Cigarettes  . Smokeless tobacco: Never Used  Substance Use Topics  . Alcohol use: Yes    Alcohol/week: 6.0 standard drinks    Types: 6 Cans of beer per week    Comment: occasionally   . Drug use: Not Currently    Types: Cocaine, Benzodiazepines    Comment: "Molly"    Home Medications Prior to Admission medications   Medication Sig Start Date End Date Taking? Authorizing Provider  ALPRAZolam Duanne Moron) 1 MG tablet Take 1 mg by mouth at bedtime as needed for anxiety.    [provider]  amphetamine-dextroamphetamine (ADDERALL) 30 MG tablet Take 30 mg by mouth 2 (two) times daily.    [provider]  DiphenhydrAMINE HCl (BENADRYL PO) Take by mouth.    [provider]  DULoxetine (CYMBALTA) 60 MG capsule Take 1 capsule (60 mg total) by mouth daily. 10/13/14   Rankin, Shuvon B, NP  gabapentin (NEURONTIN) 300 MG capsule Take 1 capsule (300 mg total) by mouth 3 (three) times daily. 10/13/14   Rankin, Shuvon B, NP  hydrochlorothiazide (HYDRODIURIL) 25 MG tablet Take 1 tablet (25 mg total) by mouth daily. 10/22/15   Ward, Delice Bison, DO  ibuprofen (ADVIL,MOTRIN) 800 MG tablet Take 1  tablet (800 mg total) by mouth every 8 (eight) hours as needed for mild pain. 10/22/15   Ward, Layla Maw, DO  metoprolol (LOPRESSOR) 50 MG tablet Take 1 tablet (50 mg total) by mouth 2 (two) times daily. 10/22/15   Ward, Layla Maw, DO  oxyCODONE-acetaminophen (PERCOCET) 10-325 MG tablet Take 1 tablet by mouth 5 (five) times daily as needed for pain.    [provider]  traZODone (DESYREL) 50 MG tablet Take 150 mg by mouth at bedtime.     [provider]    Allergies    Patient has no known allergies.  Review of Systems   Review of Systems  Constitutional: Negative for activity change, appetite change and fever.  Eyes: Negative for photophobia,  pain and visual disturbance.  Respiratory: Negative for shortness of breath.   Cardiovascular: Negative for chest pain.  Gastrointestinal: Negative for abdominal pain, nausea and vomiting.  Musculoskeletal: Negative for neck pain and neck stiffness.  Skin: Negative for rash and wound.  Neurological: Positive for headaches. Negative for dizziness, syncope, facial asymmetry, speech difficulty, weakness and numbness.  Psychiatric/Behavioral: Positive for confusion. Negative for decreased concentration.    Physical Exam Updated Vital Signs BP (!) 174/122 (BP Location: Right Arm)   Pulse 94   Temp 98.5 F (36.9 C) (Oral)   Resp 16   Ht 5\' 11"  (1.803 m)   Wt 99.8 kg   SpO2 97%   BMI 30.68 kg/m   Physical Exam Vitals and nursing note reviewed.  Constitutional:      General: She is not in acute distress.    Appearance: Normal appearance. She is not ill-appearing.  HENT:     Head: Normocephalic.     Right Ear: Tympanic membrane and ear canal normal.     Left Ear: Tympanic membrane and ear canal normal.     Mouth/Throat:     Mouth: Mucous membranes are moist.  Eyes:     Extraocular Movements: Extraocular movements intact.     Conjunctiva/sclera: Conjunctivae normal.     Pupils: Pupils are equal, round, and reactive to light.  Neck:     Thyroid: No thyromegaly.     Meningeal: Kernig's sign absent.  Cardiovascular:     Rate and Rhythm: Normal rate and regular rhythm.     Pulses: Normal pulses.  Pulmonary:     Effort: Pulmonary effort is normal.     Breath sounds: Normal breath sounds. No wheezing.  Chest:     Chest wall: No tenderness.  Abdominal:     Palpations: Abdomen is soft.     Tenderness: There is no abdominal tenderness. There is no guarding or rebound.  Musculoskeletal:        General: Normal range of motion.     Cervical back: Normal range of motion. No rigidity or tenderness.     Right lower leg: No edema.     Left lower leg: No edema.  Skin:    General: Skin  is warm.     Capillary Refill: Capillary refill takes less than 2 seconds.     Findings: No rash.  Neurological:     Mental Status: She is alert and oriented to person, place, and time.     GCS: GCS eye subscore is 4. GCS verbal subscore is 5. GCS motor subscore is 6.     Sensory: Sensation is intact.     Motor: Motor function is intact. No weakness.     Coordination: Coordination normal.     Comments:  CN II-XII intact.  Speech clear, no pronator drift.  Normal finger-nose testing.     ED Results / Procedures / Treatments   Labs (all labs ordered are listed, but only abnormal results are displayed) Labs Reviewed  BASIC METABOLIC PANEL  CBC WITH DIFFERENTIAL/PLATELET  ETHANOL  RAPID URINE DRUG SCREEN, HOSP PERFORMED    EKG EKG Interpretation  Date/Time:  Tuesday Jul 05 2019 01:12:37 EDT Ventricular Rate:  92 PR Interval:    QRS Duration: 93 QT Interval:  374 QTC Calculation: 463 R Axis:   54 Text Interpretation: Sinus rhythm Left atrial enlargement When compared with ECG of 01/14/2011, No significant change was found Confirmed by Dione Booze ([id number]) on 07/05/2019 1:22:59 AM   Radiology CT Head Wo Contrast  Result Date: 07/05/2019 CLINICAL DATA:  Headache, normal neuro exam, elevated blood pressure EXAM: CT HEAD WITHOUT CONTRAST TECHNIQUE: Contiguous axial images were obtained from the base of the skull through the vertex without intravenous contrast. COMPARISON:  None. FINDINGS: Brain: No evidence of acute infarction, hemorrhage, hydrocephalus, extra-axial collection or mass lesion/mass effect. Normal midline intracranial structures. Cerebellar tonsils are normally positioned. Vascular: No hyperdense vessel or unexpected calcification. Skull: No calvarial fracture or suspicious osseous lesion. No scalp swelling or hematoma. Sinuses/Orbits: Included orbital structures are unremarkable. Mild mural thickening throughout the ethmoids. No air-fluid levels are pneumatized secretions.  Remaining paranasal sinuses and mastoid air cells are predominantly clear. Middle ear cavities are clear. Other: None IMPRESSION: 1. No acute intracranial findings. 2. Mild ethmoid sinus disease. Electronically Signed   By: Kreg Shropshire M.D.   On: 07/05/2019 01:13    Procedures Procedures (including critical care time)  Medications Ordered in ED Medications  acetaminophen (TYLENOL) tablet 650 mg (650 mg Oral Given 07/05/19 0036)    ED Course  I have reviewed the triage vital signs and the nursing notes.  Pertinent labs & imaging results that were available during my care of the patient were reviewed by me and considered in my medical decision making (see chart for details).    MDM Rules/Calculators/A&P                      Patient here with complaint of headache, dizziness for 1 week.  Reports some confusion and difficulty with recall earlier today.  History of hypertension and admits to noncompliance with his medications.  No focal neuro deficits on exam.  Will address his headache with Tylenol and order his routine dosing of lisinopril/hydrochlorothiazide and metoprolol.  We will also obtain baseline laboratory studies to include CBC, BMP, EtOH and UDS.  Noncontrasted CT head also ordered.  Pt also seen by Dr. Preston Fleeting and care plan discussed.   CT head w/o acute findings and labs reassuring.  No concerns for acute cardiac or neurological process.   On recheck, pt remains hypertensive. Anti-hypertensives given.  Appears appropriate for d/c home.  Counseled on importance of taking his medications daily, smoking cessation and monitoring BP at home.  Return precautions discussed.   Final Clinical Impression(s) / ED Diagnoses Final diagnoses:  Hypertension, unspecified type    Rx / DC Orders ED Discharge Orders    None       Pauline Aus, PA-C 07/05/19 0150    Dione Booze, MD 07/05/19 [phone]

## 2019-07-05 NOTE — Congregational Nurse Program (Signed)
  Dept: [phone]   Congregational Nurse Program Note  Date of Encounter: 07/04/2019  Past Medical History: Past Medical History:  Diagnosis Date   Anxiety    Back pain    Depression    Hypertension     Encounter Details: CNP Questionnaire - 07/04/19 1600       Questionnaire   Patient Status  Not Applicable    Race  White or Caucasian    Location Patient Served At  [facility]    Uninsured  Not Applicable    Food  No food insecurities    Housing/Utilities  Yes, have permanent housing    Transportation  No transportation needs    Interpersonal Safety  Yes, feel physically and emotionally safe where you currently live    Medication  No medication insecurities    Medical Provider  Yes    Referrals  Emergency Department;Primary Care Provider/Clinic    ED Visit Averted  Not Applicable    Life-Saving Intervention Made  Not Applicable        Mr. Alexander Pope stopped by [facility] requesting a blood pressure check. He lives in the apartments here at [facility]. Client reports knowing his blood pressure has been very high and he self reports that he is not as compliant with his medications. He states he sees Dr. Sudie Bailey but has not seen him in a while.  He states he has medicaid as a single parent of 43 year old. He is attempting to get disability.  Blood pressure today  Right arm sitting: 192/129 HR 114  Left Arm Sitting: 180/130 HR 114   Client is alert and oriented to person place and time. Client denies chest pain or shortness of breath. Gait is balanced and no extremity weakness of legs or arms. No facial drooping, tongue midline, no vision changes   Speech is clear, client does complain of headache and dizziness. Client reports he took metoprolol unsure of dosage 45 min prior to arriving, but reports he does not take his medications daily and he has also been prescribed Lisinopril/HCTZ . Client states he sees Dr. Sudie Bailey, but has  been unable to see him recently or reach him.   Due to symptoms and critical blood pressure readings, I strongly recommended that client seek emergency treatment and evaluation and new prescriptions if needed. Discussed risks of uncontrolled untreated hypertension including risk of heart attack, stroke and death. Discussed that client should take his medications not according to if he has a headache but daily as prescribed. Also suggested he make an appointment soon with Primary care provider and if he cannot be seen that [facility] could give him names of offices accepting new Medicaid patients. Client reports understanding and states he will go to be evaluated.  Will plan a follow up tomorrow 07/05/19 and client agreeable. Explained to client that [facility] is not a primary care provider and cannot prescribe medications and that his blood pressure is in need of urgent treatment. Client reports understanding.   Also encouraged client to stop by [facility] during office hours for blood pressure rechecks as Mr. Delpriore lives in this apartment complex we are easily accessible

## 2019-09-28 ENCOUNTER — Ambulatory Visit (INDEPENDENT_AMBULATORY_CARE_PROVIDER_SITE_OTHER): Payer: Medicaid Other

## 2019-09-28 ENCOUNTER — Other Ambulatory Visit: Payer: Self-pay

## 2019-09-28 ENCOUNTER — Ambulatory Visit
Admission: EM | Admit: 2019-09-28 | Discharge: 2019-09-28 | Disposition: A | Discharge status: Home / Self Care | Payer: Medicaid Other | Attending: Emergency Medicine | Admitting: Emergency Medicine

## 2019-09-28 DIAGNOSIS — R0781 Pleurodynia: Secondary | ICD-10-CM

## 2019-09-28 DIAGNOSIS — R079 Chest pain, unspecified: Secondary | ICD-10-CM

## 2019-09-28 NOTE — Discharge Instructions (Addendum)
Follow-up with PCP Follow RICE instruction as attached Continue to take Percocet as prescribed Return or go to ED for worsening of symptoms

## 2019-09-28 NOTE — ED Provider Notes (Signed)
[facility]   [id number] 09/28/19 Arrival Time: 1632  Chief Complaint  Patient presents with  . Chest Pain     SUBJECTIVE: History from: patient.  ARDITH LEWMAN is a 39 y.o. adult who presented to the urgent care for complaint of left rib pain that occurred today.  State he was kicked by his son while  playing.  Localized pain to the left rib cage.  He describes the pain as constant and achy.  He has tried OTC medications without relief.  His symptoms are made respiration.  He denies similar symptoms in the past.  Denies chills, fever, nausea, vomiting, diarrhea  ROS: As per HPI.  All other pertinent ROS negative.     Past Medical History:  Diagnosis Date  . Anxiety   . Back pain   . Depression   . Hypertension    Past Surgical History:  Procedure Laterality Date  . BACK SURGERY     No Known Allergies No current facility-administered medications on file prior to encounter.   Current Outpatient Medications on File Prior to Encounter  Medication Sig Dispense Refill  . ALPRAZolam (XANAX) 1 MG tablet Take 1 mg by mouth at bedtime as needed for anxiety.    Marland Kitchen amphetamine-dextroamphetamine (ADDERALL) 30 MG tablet Take 30 mg by mouth 2 (two) times daily.    . DiphenhydrAMINE HCl (BENADRYL PO) Take by mouth.    . DULoxetine (CYMBALTA) 60 MG capsule Take 1 capsule (60 mg total) by mouth daily. 30 capsule 0  . gabapentin (NEURONTIN) 300 MG capsule Take 1 capsule (300 mg total) by mouth 3 (three) times daily. 90 capsule 0  . ibuprofen (ADVIL,MOTRIN) 800 MG tablet Take 1 tablet (800 mg total) by mouth every 8 (eight) hours as needed for mild pain. 30 tablet 0  . lisinopril-hydrochlorothiazide (ZESTORETIC) 20-12.5 MG tablet Take 1 tablet by mouth daily. 30 tablet 0  . metoprolol tartrate (LOPRESSOR) 50 MG tablet Take 1 tablet (50 mg total) by mouth 2 (two) times daily. 60 tablet 0  . oxyCODONE-acetaminophen (PERCOCET) 10-325 MG tablet Take 1 tablet by mouth 5 (five) times daily  as needed for pain.    . traZODone (DESYREL) 50 MG tablet Take 150 mg by mouth at bedtime.     . [DISCONTINUED] hydrochlorothiazide (HYDRODIURIL) 25 MG tablet Take 1 tablet (25 mg total) by mouth daily. 30 tablet 0   Social History   Socioeconomic History  . Marital status: Single    Spouse name: Not on file  . Number of children: Not on file  . Years of education: Not on file  . Highest education level: Not on file  Occupational History  . Not on file  Tobacco Use  . Smoking status: Current Every Day Smoker    Packs/day: 1.00    Types: Cigarettes  . Smokeless tobacco: Never Used  Vaping Use  . Vaping Use: Never used  Substance and Sexual Activity  . Alcohol use: Yes    Alcohol/week: 6.0 standard drinks    Types: 6 Cans of beer per week    Comment: occasionally   . Drug use: Not Currently    Types: Cocaine, Benzodiazepines    Comment: "Molly"  . Sexual activity: Not Currently  Other Topics Concern  . Not on file  Social History Narrative  . Not on file   Social Determinants of Health   Financial Resource Strain:   . Difficulty of Paying Living Expenses:   Food Insecurity:   . Worried About Running  Out of Food in the Last Year:   . Ran Out of Food in the Last Year:   Transportation Needs:   . Lack of Transportation (Medical):   Marland Kitchen Lack of Transportation (Non-Medical):   Physical Activity:   . Days of Exercise per Week:   . Minutes of Exercise per Session:   Stress:   . Feeling of Stress :   Social Connections:   . Frequency of Communication with Friends and Family:   . Frequency of Social Gatherings with Friends and Family:   . Attends Religious Services:   . Active Member of Clubs or Organizations:   . Attends Banker Meetings:   Marland Kitchen Marital Status:   Intimate Partner Violence:   . Fear of Current or Ex-Partner:   . Emotionally Abused:   Marland Kitchen Physically Abused:   . Sexually Abused:    Family History  Problem Relation Age of Onset  . Stroke  Mother     OBJECTIVE:  Vitals:   09/28/19 1657  BP: (S) (!) 163/101  Pulse: (!) 123  Temp: 98.7 F (37.1 C)  SpO2: 94%     Physical Exam Vitals and nursing note reviewed.  Constitutional:      General: She is not in acute distress.    Appearance: Normal appearance. She is normal weight. She is not ill-appearing, toxic-appearing or diaphoretic.  HENT:     Head: Normocephalic.  Cardiovascular:     Rate and Rhythm: Normal rate and regular rhythm.     Pulses: Normal pulses.     Heart sounds: Normal heart sounds. No murmur heard.  No friction rub. No gallop.   Pulmonary:     Effort: Pulmonary effort is normal. No respiratory distress.     Breath sounds: Normal breath sounds. No stridor. No wheezing, rhonchi or rales.  Chest:     Chest wall: No tenderness.  Abdominal:     Tenderness: There is no right CVA tenderness or left CVA tenderness.  Musculoskeletal:        General: Tenderness present. Normal range of motion.     Comments: Left rib cage pain with deep breathing  Neurological:     Mental Status: She is alert and oriented to person, place, and time.     LABS:  No results found for this or any previous visit (from the past 24 hour(s)).   RADIOLOGY  DG Chest 2 View  Result Date: 09/28/2019 CLINICAL DATA:  39 year old male with injury and left chest wall pain. EXAM: CHEST - 2 VIEW COMPARISON:  Chest radiograph dated 05/10/2015 FINDINGS: No focal consolidation, pleural effusion, or pneumothorax. The cardiac silhouette is within limits. No acute osseous pathology. No displaced rib fractures. IMPRESSION: No active cardiopulmonary disease. Electronically Signed   By: Elgie Collard M.D.   On: 09/28/2019 17:43     ASSESSMENT & PLAN:  1. Rib pain on left side     No orders of the defined types were placed in this encounter.  Patient is stable at discharge.  The left chest x-ray is negative for bony abnormality including fracture or dislocation.  I have reviewed  the x-ray myself and the radiologist interpretation.  I am in agreement with the radiologist interpretation.   Discharge instructions Follow-up with PCP Follow RICE instruction as attached Continue to take Percocet as prescribed Return or go to ED for worsening of symptoms  Reviewed expectations re: course of current medical issues. Questions answered. Outlined signs and symptoms indicating need for more acute intervention. Patient  verbalized understanding. After Visit Summary given.     Note: This document was prepared using Dragon voice recognition software and may include unintentional dictation errors.     Durward Parcel, FNP 09/28/19 1751

## 2019-09-28 NOTE — ED Triage Notes (Signed)
Pt presents with left rib pain after kicked while horseplay with son

## 2019-11-02 ENCOUNTER — Other Ambulatory Visit: Payer: Self-pay

## 2019-11-03 ENCOUNTER — Emergency Department (HOSPITAL_COMMUNITY)
Admission: EM | Admit: 2019-11-03 | Discharge: 2019-11-03 | Discharge status: Against Medical Advice | Payer: Medicaid Other

## 2019-11-03 NOTE — ED Notes (Signed)
Called x 1, with no answer. No one outside.

## 2019-12-26 ENCOUNTER — Emergency Department (HOSPITAL_COMMUNITY)
Admission: EM | Admit: 2019-12-26 | Discharge: 2019-12-26 | Disposition: A | Discharge status: Home / Self Care | Payer: Medicaid Other | Attending: Emergency Medicine | Admitting: Emergency Medicine

## 2019-12-26 ENCOUNTER — Ambulatory Visit
Admission: EM | Admit: 2019-12-26 | Discharge: 2019-12-26 | Disposition: A | Discharge status: Home / Self Care | Payer: Medicaid Other

## 2019-12-26 ENCOUNTER — Emergency Department (HOSPITAL_COMMUNITY): Payer: Medicaid Other

## 2019-12-26 ENCOUNTER — Other Ambulatory Visit: Payer: Self-pay

## 2019-12-26 ENCOUNTER — Encounter (HOSPITAL_COMMUNITY): Payer: Self-pay

## 2019-12-26 DIAGNOSIS — F17210 Nicotine dependence, cigarettes, uncomplicated: Secondary | ICD-10-CM | POA: Diagnosis not present

## 2019-12-26 DIAGNOSIS — I10 Essential (primary) hypertension: Secondary | ICD-10-CM | POA: Insufficient documentation

## 2019-12-26 DIAGNOSIS — I809 Phlebitis and thrombophlebitis of unspecified site: Secondary | ICD-10-CM

## 2019-12-26 DIAGNOSIS — Z79899 Other long term (current) drug therapy: Secondary | ICD-10-CM | POA: Insufficient documentation

## 2019-12-26 DIAGNOSIS — M79605 Pain in left leg: Secondary | ICD-10-CM | POA: Diagnosis present

## 2019-12-26 LAB — COMPREHENSIVE METABOLIC PANEL
ALT: 56 U/L — ABNORMAL HIGH (ref 0–44)
AST: 36 U/L (ref 15–41)
Albumin: 4.8 g/dL (ref 3.5–5.0)
Alkaline Phosphatase: 58 U/L (ref 38–126)
Anion gap: 14 (ref 5–15)
BUN: 17 mg/dL (ref 6–20)
CO2: 21 mmol/L — ABNORMAL LOW (ref 22–32)
Calcium: 9.8 mg/dL (ref 8.9–10.3)
Chloride: 102 mmol/L (ref 98–111)
Creatinine, Ser: 1.43 mg/dL — ABNORMAL HIGH (ref 0.61–1.24)
GFR, Estimated: >60 mL/min (ref >60)
Glucose, Bld: 131 mg/dL — ABNORMAL HIGH (ref 70–99)
Potassium: 3.9 mmol/L (ref 3.5–5.1)
Sodium: 137 mmol/L (ref 135–145)
Total Bilirubin: 0.9 mg/dL (ref 0.3–1.2)
Total Protein: 8.8 g/dL — ABNORMAL HIGH (ref 6.5–8.1)

## 2019-12-26 LAB — CBC WITH DIFFERENTIAL/PLATELET
Abs Immature Granulocytes: 0.03 10*3/uL (ref 0.00–0.07)
Basophils Absolute: 0.1 10*3/uL (ref 0.0–0.1)
Basophils Relative: 0 %
Eosinophils Absolute: 0.1 10*3/uL (ref 0.0–0.5)
Eosinophils Relative: 1 %
HCT: 52.5 % — ABNORMAL HIGH (ref 39.0–52.0)
Hemoglobin: 17.6 g/dL — ABNORMAL HIGH (ref 13.0–17.0)
Immature Granulocytes: 0 %
Lymphocytes Relative: 16 %
Lymphs Abs: 2.2 10*3/uL (ref 0.7–4.0)
MCH: 30.9 pg (ref 26.0–34.0)
MCHC: 33.5 g/dL (ref 30.0–36.0)
MCV: 92.3 fL (ref 80.0–100.0)
Monocytes Absolute: 1 10*3/uL (ref 0.1–1.0)
Monocytes Relative: 7 %
Neutro Abs: 10.7 10*3/uL — ABNORMAL HIGH (ref 1.7–7.7)
Neutrophils Relative %: 76 %
Platelets: 231 10*3/uL (ref 150–400)
RBC: 5.69 MIL/uL (ref 4.22–5.81)
RDW: 13.3 % (ref 11.5–15.5)
WBC: 14.1 10*3/uL — ABNORMAL HIGH (ref 4.0–10.5)
nRBC: 0 % (ref 0.0–0.2)

## 2019-12-26 MED ORDER — IBUPROFEN 400 MG PO TABS
400.0000 mg | ORAL_TABLET | Freq: Three times a day (TID) | ORAL | 0 refills | Status: AC
Start: 2019-12-26 — End: 2020-01-02

## 2019-12-26 MED ORDER — DOXYCYCLINE HYCLATE 100 MG PO CAPS
100.0000 mg | ORAL_CAPSULE | Freq: Two times a day (BID) | ORAL | 0 refills | Status: DC
Start: 2019-12-26 — End: 2020-11-17

## 2019-12-26 MED ORDER — DOXYCYCLINE HYCLATE 100 MG PO TABS
100.0000 mg | ORAL_TABLET | Freq: Once | ORAL | Status: AC
  Administered 2019-12-26: 100 mg via ORAL
  Filled 2019-12-26: qty 1

## 2019-12-26 MED ORDER — IBUPROFEN 400 MG PO TABS
400.0000 mg | ORAL_TABLET | Freq: Once | ORAL | Status: AC
  Administered 2019-12-26: 400 mg via ORAL
  Filled 2019-12-26: qty 1

## 2019-12-26 NOTE — ED Provider Notes (Signed)
[facility] EMERGENCY DEPARTMENT Provider Note   CSN: [id number] Arrival date & time: 12/26/19  1642     History Chief Complaint  Patient presents with  . Leg Pain    Alexander Pope is a 39 y.o. adult.  HPI    Patient presents after being sent here from urgent care with concern for left leg pain, cutaneous changes. Patient notes a history of hypertension, but otherwise states that he is generally well.  He smokes half pack daily, drinks several beers daily. He was, however, in his usual state of health until about 1 week ago. About that time he noticed pain, erythema in the left leg medially from his calf ascending to his thigh.  No systemic complaints including fever, chills, nausea, vomiting.  Pain is moderate, persistent. Today, after seeing urgent care he was sent here for concern of DVT. Past Medical History:  Diagnosis Date  . Anxiety   . Back pain   . Depression   . Hypertension     Patient Active Problem List   Diagnosis Date Noted  . Alcohol dependence with uncomplicated withdrawal (HCC)   . Opioid dependence with opioid-induced mood disorder (HCC)   . Alcohol dependence (HCC) 10/10/2014  . Opioid dependence (HCC) 10/10/2014  . Substance induced mood disorder (HCC) 10/10/2014    Past Surgical History:  Procedure Laterality Date  . BACK SURGERY         Family History  Problem Relation Age of Onset  . Stroke Mother     Social History   Tobacco Use  . Smoking status: Current Every Day Smoker    Packs/day: 1.00    Types: Cigarettes  . Smokeless tobacco: Never Used  Vaping Use  . Vaping Use: Never used  Substance Use Topics  . Alcohol use: Yes    Alcohol/week: 6.0 standard drinks    Types: 6 Cans of beer per week    Comment: occasionally   . Drug use: Not Currently    Types: Cocaine, Benzodiazepines    Comment: "Molly"    Home Medications Prior to Admission medications   Medication Sig Start Date End Date Taking? Authorizing Provider    ALPRAZolam Prudy Feeler) 1 MG tablet Take 1 mg by mouth at bedtime as needed for anxiety.    [provider]  amphetamine-dextroamphetamine (ADDERALL) 30 MG tablet Take 30 mg by mouth 2 (two) times daily.    [provider]  DiphenhydrAMINE HCl (BENADRYL PO) Take by mouth.    [provider]  DULoxetine (CYMBALTA) 60 MG capsule Take 1 capsule (60 mg total) by mouth daily. 10/13/14   Rankin, Shuvon B, NP  gabapentin (NEURONTIN) 300 MG capsule Take 1 capsule (300 mg total) by mouth 3 (three) times daily. 10/13/14   Rankin, Shuvon B, NP  ibuprofen (ADVIL,MOTRIN) 800 MG tablet Take 1 tablet (800 mg total) by mouth every 8 (eight) hours as needed for mild pain. 10/22/15   Ward, Layla Maw, DO  lisinopril-hydrochlorothiazide (ZESTORETIC) 20-12.5 MG tablet Take 1 tablet by mouth daily. 07/05/19   Triplett, Tammy, PA-C  metoprolol tartrate (LOPRESSOR) 50 MG tablet Take 1 tablet (50 mg total) by mouth 2 (two) times daily. 07/05/19   Triplett, Tammy, PA-C  oxyCODONE-acetaminophen (PERCOCET) 10-325 MG tablet Take 1 tablet by mouth 5 (five) times daily as needed for pain.    [provider]  traZODone (DESYREL) 50 MG tablet Take 150 mg by mouth at bedtime.     [provider]  hydrochlorothiazide (HYDRODIURIL) 25 MG tablet  Take 1 tablet (25 mg total) by mouth daily. 10/22/15 07/05/19  Ward, Layla Maw, DO    Allergies    Patient has no known allergies.  Review of Systems   Review of Systems  Constitutional:       Per HPI, otherwise negative  HENT:       Per HPI, otherwise negative  Respiratory:       Per HPI, otherwise negative  Cardiovascular:       Per HPI, otherwise negative  Gastrointestinal: Negative for vomiting.  Endocrine:       Negative aside from HPI  Genitourinary:       Neg aside from HPI   Musculoskeletal:       Per HPI, otherwise negative  Skin: Positive for color change.  Neurological: Negative for syncope.    Physical Exam Updated Vital  Signs BP (!) 154/105 (BP Location: Right Arm)   Pulse (!) 135   Temp 97.9 F (36.6 C) (Oral)   Resp 20   Ht 5\' 11"  (1.803 m)   Wt 102.1 kg   SpO2 95%   BMI 31.38 kg/m   Physical Exam Vitals and nursing note reviewed.  Constitutional:      General: She is not in acute distress.    Appearance: She is well-developed.  HENT:     Head: Normocephalic and atraumatic.  Eyes:     Conjunctiva/sclera: Conjunctivae normal.  Pulmonary:     Effort: Pulmonary effort is normal. No respiratory distress.     Breath sounds: No stridor.  Abdominal:     General: There is no distension.  Skin:    General: Skin is warm and dry.       Neurological:     Mental Status: She is alert and oriented to person, place, and time.      ED Results / Procedures / Treatments   Labs (all labs ordered are listed, but only abnormal results are displayed) Labs Reviewed  CBC WITH DIFFERENTIAL/PLATELET - Abnormal; Notable for the following components:      Result Value   WBC 14.1 (*)    Hemoglobin 17.6 (*)    HCT 52.5 (*)    Neutro Abs 10.7 (*)    All other components within normal limits  COMPREHENSIVE METABOLIC PANEL    EKG None  Radiology Venous Img Lower Unilateral Left  Result Date: 12/26/2019 CLINICAL DATA:  39 year old male with left lower extremity pain and redness. EXAM: Left LOWER EXTREMITY VENOUS DOPPLER ULTRASOUND TECHNIQUE: Gray-scale sonography with compression, as well as color and duplex ultrasound, were performed to evaluate the deep venous system(s) from the level of the common femoral vein through the popliteal and proximal calf veins. COMPARISON:  None. FINDINGS: VENOUS Normal compressibility of the common femoral, superficial femoral, and popliteal veins, as well as the visualized calf veins. Visualized portions of profunda femoral vein and great saphenous vein unremarkable. No filling defects to suggest DVT on grayscale or color Doppler imaging. Doppler waveforms show normal  direction of venous flow, normal respiratory plasticity and response to augmentation. Limited views of the contralateral common femoral vein are unremarkable. OTHER None. Limitations: none IMPRESSION: Negative. Electronically Signed   By: 20 M.D.   On: 12/26/2019 17:30    Procedures Procedures (including critical care time)  Medications Ordered in ED Medications - No data to display  ED Course  I have reviewed the triage vital signs and the nursing notes.  Pertinent labs & imaging results that were available during my care  of the patient were reviewed by me and considered in my medical decision making (see chart for details).    MDM Rules/Calculators/A&P General well, awake, alert, speaking clearly presents with new left leg lesion. Ultrasound reviewed, negative for DVT. Given the erythematous changes along his femoral vein suggest phlebitis.  No evidence for systemic disease, mild leukocytosis, but no fever, no distress. Patient started on appropriate antibiotics, discharged in stable condition. Final Clinical Impression(s) / ED Diagnoses Final diagnoses:  Superficial phlebitis   MDM Number of Diagnoses or Management Options Superficial phlebitis: new, needed workup   Amount and/or Complexity of Data Reviewed Clinical lab tests: reviewed Tests in the radiology section of CPT: reviewed Tests in the medicine section of CPT: reviewed Independent visualization of images, tracings, or specimens: yes  Risk of Complications, Morbidity, and/or Mortality Presenting problems: high Diagnostic procedures: high Management options: high  Critical Care Total time providing critical care: < 30 minutes  Patient Progress Patient progress: stable  Rx / DC Orders ED Discharge Orders         Ordered    doxycycline (VIBRAMYCIN) 100 MG capsule  2 times daily        12/26/19 1908    ibuprofen (ADVIL) 400 MG tablet  3 times daily        12/26/19 1908            Gerhard Munch, MD 12/26/19 1910

## 2019-12-26 NOTE — Discharge Instructions (Signed)
Please take all medication as directed, and return here for concerning changes, otherwise follow-up with your physician.

## 2019-12-26 NOTE — ED Triage Notes (Signed)
Pt presents with left leg pain and redness that goes from ankle ti inner thigh that began about a week ago        Patient is being discharged from the Urgent Care and sent to the Emergency Department via private vehicle  . Per Doyce Para, patient is in need of higher level of care to rule out blood clot . Patient is aware and verbalizes understanding of plan of care.  Vitals:   12/26/19 1622  BP: (!) 140/103  Pulse: (!) 130  Resp: 20  Temp: 98.8 F (37.1 C)  SpO2: 95%

## 2019-12-26 NOTE — ED Triage Notes (Signed)
Pt presents to ED, sent from Urgent care, with left leg pain. Pt with red streak from calf up back of thigh x 1 week.

## 2020-08-15 ENCOUNTER — Other Ambulatory Visit (HOSPITAL_COMMUNITY): Payer: Self-pay | Admitting: Family Medicine

## 2020-08-15 DIAGNOSIS — M25552 Pain in left hip: Secondary | ICD-10-CM

## 2020-10-15 IMAGING — CT CT HEAD W/O CM
3 series · 15 of 47 positions shown, 18 images · non-contrast
Comparison: None.

CLINICAL DATA: Headache, normal neuro exam, elevated blood pressure

EXAM:
CT HEAD WITHOUT CONTRAST
TECHNIQUE: Contiguous axial images were obtained from the base of the skull
through the vertex without intravenous contrast.

[Series 2: head w o · axial · 0.49mm/px · z∈[+30,+160]mm · 9 of 32 slices shown, 12 images]
[im 3/32  brain]
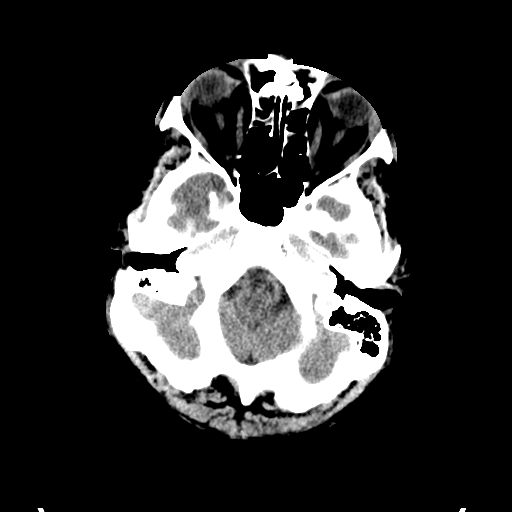
[im 3/32  bone]
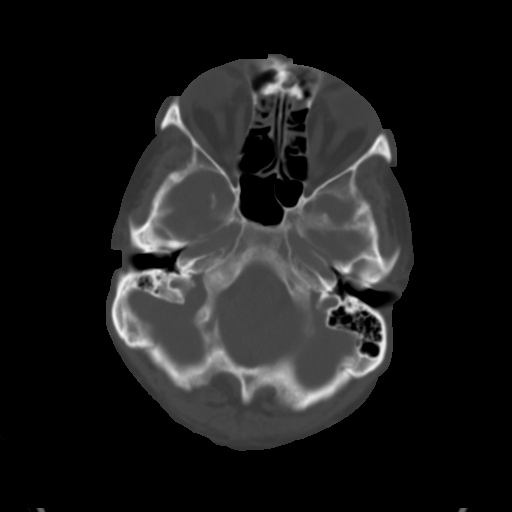
[im 6/32  brain]
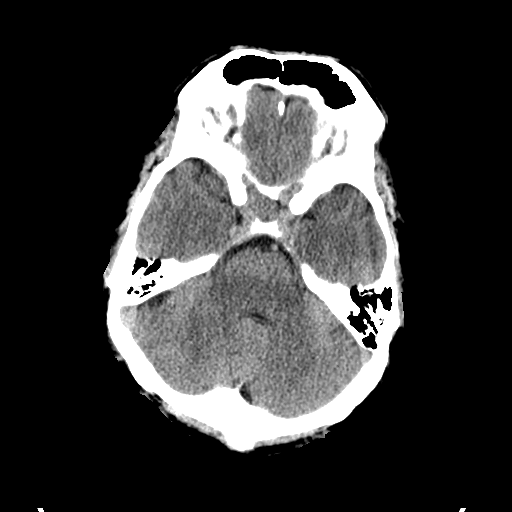
[im 9/32  brain]
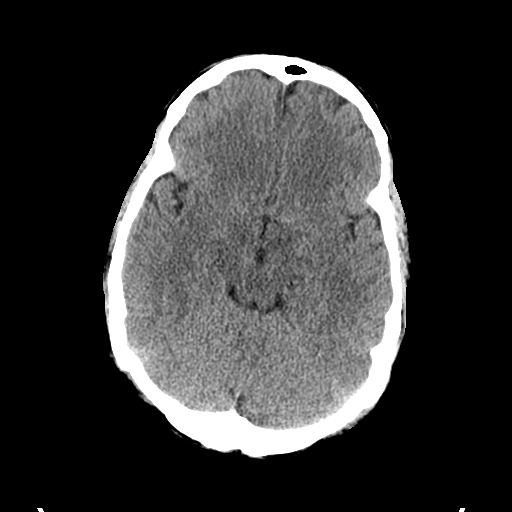
[im 12/32  brain]
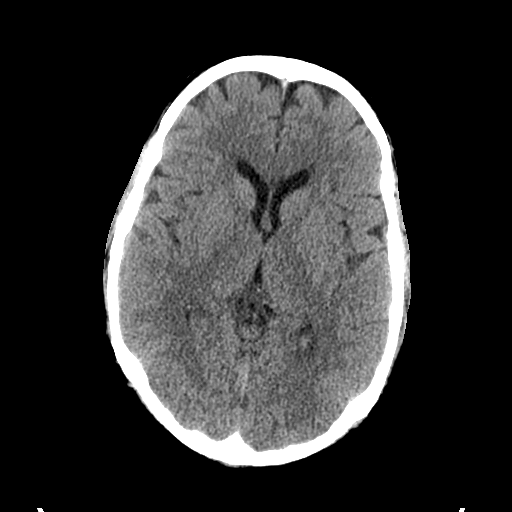
[im 17/32  brain]
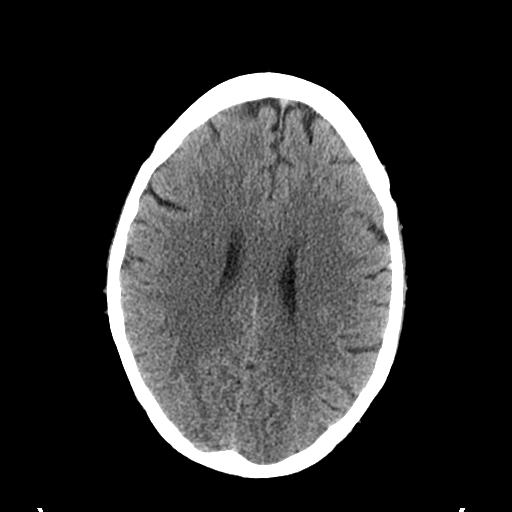
[im 17/32  bone]
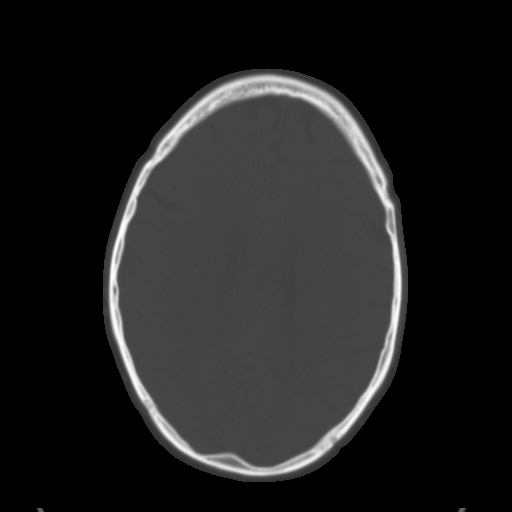
[im 20/32  brain]
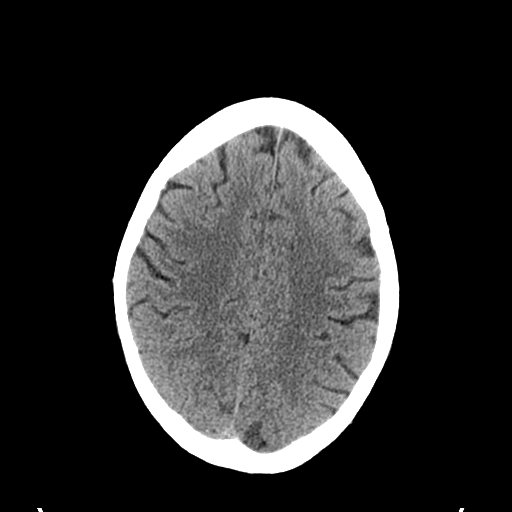
[im 23/32  brain]
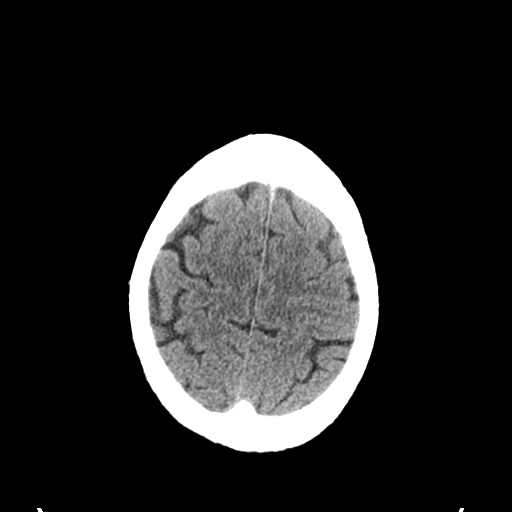
[im 26/32  brain]
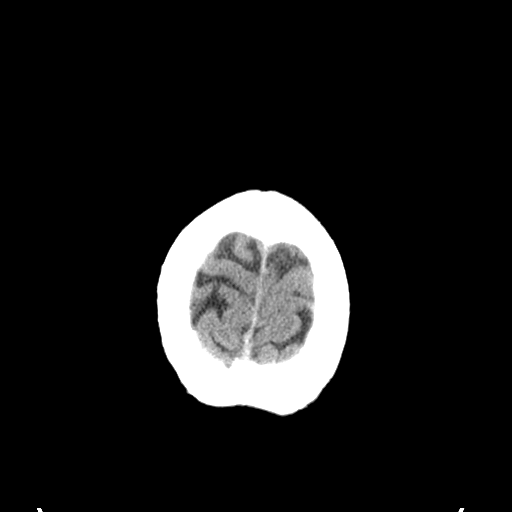
[im 29/32  brain]
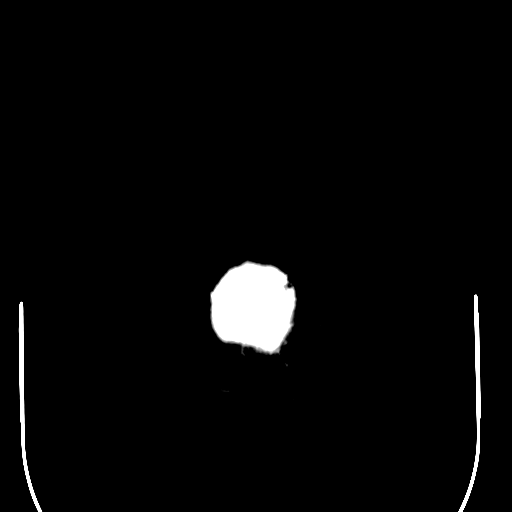
[im 29/32  bone]
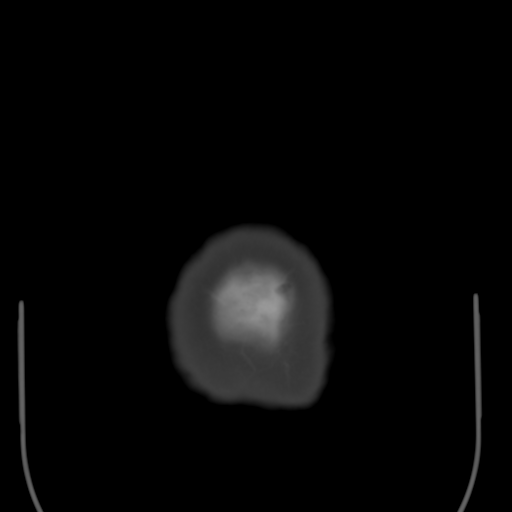

[Series 4: coronal soft · coronal · 0.28mm/px · 3 of 76 slices shown]
[im 26/76  brain]
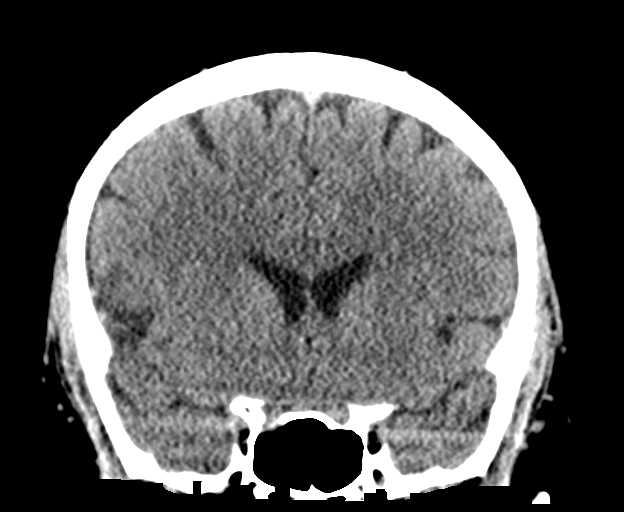
[im 34/76  brain]
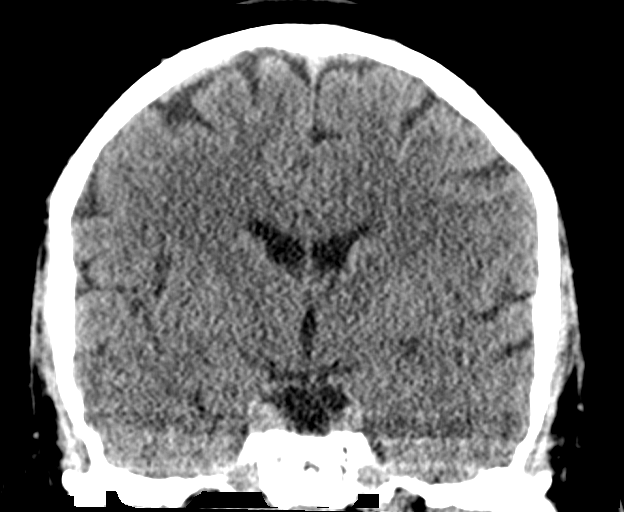
[im 42/76  brain]
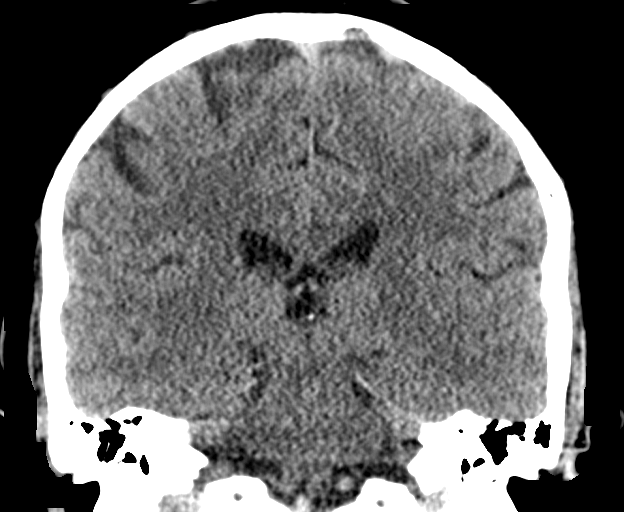

[Series 5: sagittal soft · sagittal · 0.33mm/px · 3 of 55 slices shown]
[im 19/55  brain]
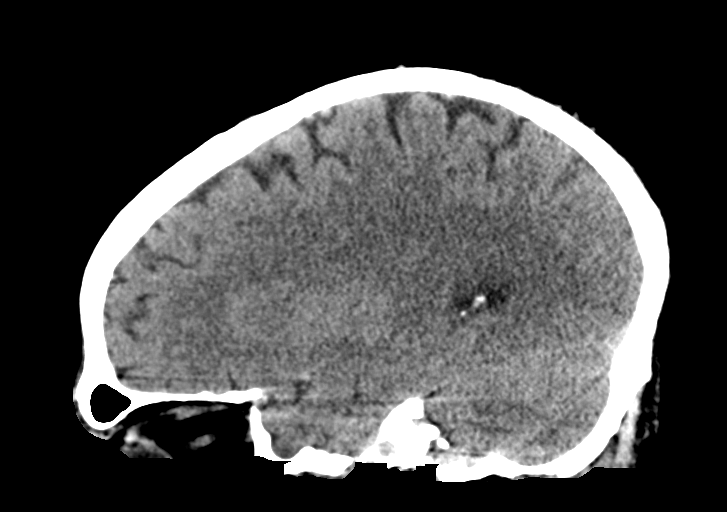
[im 28/55  brain]
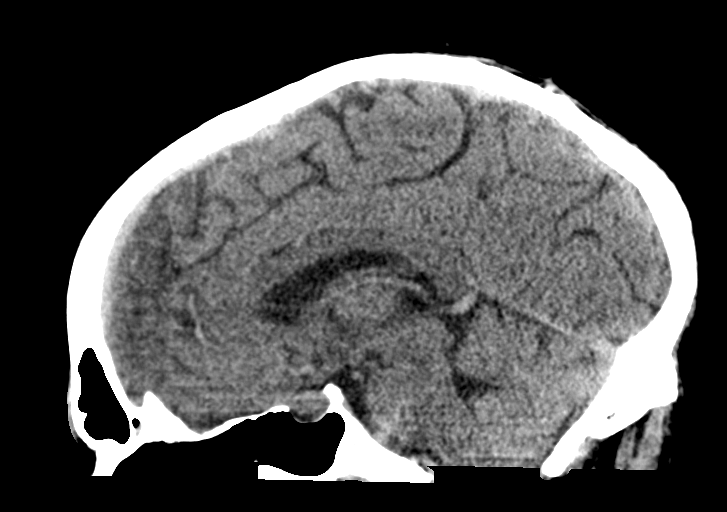
[im 37/55  brain]
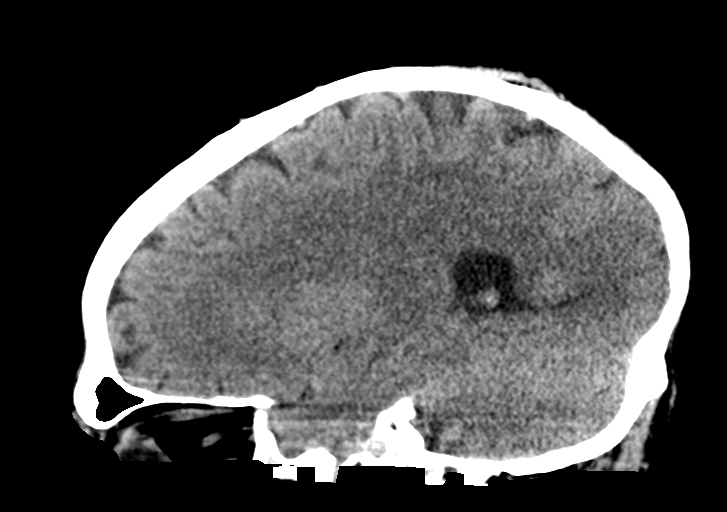

[15 of 47 positions shown; findings below may reference images not displayed]

FINDINGS: Brain: No evidence of acute infarction, hemorrhage, hydrocephalus,
extra-axial collection or mass lesion/mass effect. Normal midline
intracranial structures. Cerebellar tonsils are normally positioned.

Vascular: No hyperdense vessel or unexpected calcification.

Skull: No calvarial fracture or suspicious osseous lesion. No scalp
swelling or hematoma.

Sinuses/Orbits: Included orbital structures are unremarkable. Mild
mural thickening throughout the ethmoids. No air-fluid levels are
pneumatized secretions. Remaining paranasal sinuses and mastoid air
cells are predominantly clear. Middle ear cavities are clear.

Other: None
IMPRESSION: 1. No acute intracranial findings.
2. Mild ethmoid sinus disease.

## 2020-11-14 ENCOUNTER — Emergency Department (HOSPITAL_COMMUNITY): Payer: Medicaid Other

## 2020-11-14 ENCOUNTER — Ambulatory Visit
Admission: EM | Admit: 2020-11-14 | Discharge: 2020-11-14 | Disposition: A | Discharge status: Home / Self Care | Payer: Medicaid Other | Attending: Family Medicine | Admitting: Family Medicine

## 2020-11-14 ENCOUNTER — Inpatient Hospital Stay (HOSPITAL_COMMUNITY)
Admission: EM | Admit: 2020-11-14 | Discharge: 2020-11-17 | DRG: 682 | Disposition: A | Discharge status: Home / Self Care | Payer: Medicaid Other | Source: Ambulatory Visit | Attending: Internal Medicine | Admitting: Internal Medicine

## 2020-11-14 ENCOUNTER — Other Ambulatory Visit: Payer: Self-pay

## 2020-11-14 ENCOUNTER — Encounter (HOSPITAL_COMMUNITY): Payer: Self-pay | Admitting: Emergency Medicine

## 2020-11-14 DIAGNOSIS — W1830XA Fall on same level, unspecified, initial encounter: Secondary | ICD-10-CM | POA: Diagnosis present

## 2020-11-14 DIAGNOSIS — F1310 Sedative, hypnotic or anxiolytic abuse, uncomplicated: Secondary | ICD-10-CM | POA: Diagnosis present

## 2020-11-14 DIAGNOSIS — F1120 Opioid dependence, uncomplicated: Secondary | ICD-10-CM | POA: Diagnosis present

## 2020-11-14 DIAGNOSIS — F419 Anxiety disorder, unspecified: Secondary | ICD-10-CM | POA: Diagnosis present

## 2020-11-14 DIAGNOSIS — I959 Hypotension, unspecified: Secondary | ICD-10-CM | POA: Diagnosis not present

## 2020-11-14 DIAGNOSIS — R000 Tachycardia, unspecified: Secondary | ICD-10-CM

## 2020-11-14 DIAGNOSIS — E871 Hypo-osmolality and hyponatremia: Secondary | ICD-10-CM | POA: Diagnosis present

## 2020-11-14 DIAGNOSIS — F10230 Alcohol dependence with withdrawal, uncomplicated: Secondary | ICD-10-CM

## 2020-11-14 DIAGNOSIS — F1020 Alcohol dependence, uncomplicated: Secondary | ICD-10-CM | POA: Diagnosis present

## 2020-11-14 DIAGNOSIS — R0603 Acute respiratory distress: Secondary | ICD-10-CM

## 2020-11-14 DIAGNOSIS — S2231XA Fracture of one rib, right side, initial encounter for closed fracture: Secondary | ICD-10-CM | POA: Diagnosis present

## 2020-11-14 DIAGNOSIS — Z720 Tobacco use: Secondary | ICD-10-CM | POA: Diagnosis present

## 2020-11-14 DIAGNOSIS — E860 Dehydration: Secondary | ICD-10-CM | POA: Diagnosis present

## 2020-11-14 DIAGNOSIS — Z20822 Contact with and (suspected) exposure to covid-19: Secondary | ICD-10-CM | POA: Diagnosis present

## 2020-11-14 DIAGNOSIS — F32A Depression, unspecified: Secondary | ICD-10-CM | POA: Diagnosis present

## 2020-11-14 DIAGNOSIS — F1510 Other stimulant abuse, uncomplicated: Secondary | ICD-10-CM | POA: Diagnosis present

## 2020-11-14 DIAGNOSIS — R0789 Other chest pain: Secondary | ICD-10-CM

## 2020-11-14 DIAGNOSIS — N179 Acute kidney failure, unspecified: Principal | ICD-10-CM | POA: Diagnosis present

## 2020-11-14 DIAGNOSIS — F1210 Cannabis abuse, uncomplicated: Secondary | ICD-10-CM | POA: Diagnosis present

## 2020-11-14 DIAGNOSIS — Z79899 Other long term (current) drug therapy: Secondary | ICD-10-CM

## 2020-11-14 DIAGNOSIS — D72828 Other elevated white blood cell count: Secondary | ICD-10-CM | POA: Diagnosis present

## 2020-11-14 DIAGNOSIS — F17210 Nicotine dependence, cigarettes, uncomplicated: Secondary | ICD-10-CM | POA: Diagnosis present

## 2020-11-14 DIAGNOSIS — J9601 Acute respiratory failure with hypoxia: Secondary | ICD-10-CM | POA: Diagnosis present

## 2020-11-14 DIAGNOSIS — R079 Chest pain, unspecified: Secondary | ICD-10-CM | POA: Diagnosis present

## 2020-11-14 DIAGNOSIS — I10 Essential (primary) hypertension: Secondary | ICD-10-CM | POA: Diagnosis not present

## 2020-11-14 DIAGNOSIS — Y92009 Unspecified place in unspecified non-institutional (private) residence as the place of occurrence of the external cause: Secondary | ICD-10-CM

## 2020-11-14 DIAGNOSIS — F10239 Alcohol dependence with withdrawal, unspecified: Secondary | ICD-10-CM | POA: Diagnosis present

## 2020-11-14 LAB — URINALYSIS, ROUTINE W REFLEX MICROSCOPIC
Bacteria, UA: NONE SEEN
Bilirubin Urine: NEGATIVE
Glucose, UA: NEGATIVE mg/dL
Hgb urine dipstick: NEGATIVE
Ketones, ur: NEGATIVE mg/dL
Leukocytes,Ua: NEGATIVE
Nitrite: NEGATIVE
Protein, ur: 30 mg/dL — AB
Specific Gravity, Urine: 1.02 (ref 1.005–1.030)
pH: 5 (ref 5.0–8.0)

## 2020-11-14 LAB — RAPID URINE DRUG SCREEN, HOSP PERFORMED
Amphetamines: POSITIVE — AB
Barbiturates: NOT DETECTED
Benzodiazepines: POSITIVE — AB
Cocaine: NOT DETECTED
Opiates: POSITIVE — AB
Tetrahydrocannabinol: POSITIVE — AB

## 2020-11-14 LAB — COMPREHENSIVE METABOLIC PANEL
ALT: 23 U/L (ref 0–44)
AST: 21 U/L (ref 15–41)
Albumin: 4 g/dL (ref 3.5–5.0)
Alkaline Phosphatase: 49 U/L (ref 38–126)
Anion gap: 13 (ref 5–15)
BUN: 27 mg/dL — ABNORMAL HIGH (ref 6–20)
CO2: 21 mmol/L — ABNORMAL LOW (ref 22–32)
Calcium: 8.7 mg/dL — ABNORMAL LOW (ref 8.9–10.3)
Chloride: 99 mmol/L (ref 98–111)
Creatinine, Ser: 3.42 mg/dL — ABNORMAL HIGH (ref 0.61–1.24)
GFR, Estimated: 22 mL/min — ABNORMAL LOW (ref >60)
Glucose, Bld: 116 mg/dL — ABNORMAL HIGH (ref 70–99)
Potassium: 3.8 mmol/L (ref 3.5–5.1)
Sodium: 133 mmol/L — ABNORMAL LOW (ref 135–145)
Total Bilirubin: 1.2 mg/dL (ref 0.3–1.2)
Total Protein: 7.5 g/dL (ref 6.5–8.1)

## 2020-11-14 LAB — CBC WITH DIFFERENTIAL/PLATELET
Abs Immature Granulocytes: 0.13 10*3/uL — ABNORMAL HIGH (ref 0.00–0.07)
Basophils Absolute: 0 10*3/uL (ref 0.0–0.1)
Basophils Relative: 0 %
Eosinophils Absolute: 0.2 10*3/uL (ref 0.0–0.5)
Eosinophils Relative: 1 %
HCT: 43.6 % (ref 39.0–52.0)
Hemoglobin: 14.6 g/dL (ref 13.0–17.0)
Immature Granulocytes: 1 %
Lymphocytes Relative: 7 %
Lymphs Abs: 1.2 10*3/uL (ref 0.7–4.0)
MCH: 31.2 pg (ref 26.0–34.0)
MCHC: 33.5 g/dL (ref 30.0–36.0)
MCV: 93.2 fL (ref 80.0–100.0)
Monocytes Absolute: 1.4 10*3/uL — ABNORMAL HIGH (ref 0.1–1.0)
Monocytes Relative: 8 %
Neutro Abs: 13.5 10*3/uL — ABNORMAL HIGH (ref 1.7–7.7)
Neutrophils Relative %: 83 %
Platelets: 198 10*3/uL (ref 150–400)
RBC: 4.68 MIL/uL (ref 4.22–5.81)
RDW: 13.2 % (ref 11.5–15.5)
WBC: 16.5 10*3/uL — ABNORMAL HIGH (ref 4.0–10.5)
nRBC: 0 % (ref 0.0–0.2)

## 2020-11-14 LAB — RESP PANEL BY RT-PCR (FLU A&B, COVID) ARPGX2
Influenza A by PCR: NEGATIVE
Influenza B by PCR: NEGATIVE
SARS Coronavirus 2 by RT PCR: NEGATIVE

## 2020-11-14 LAB — TROPONIN I (HIGH SENSITIVITY)
Troponin I (High Sensitivity): 3 ng/L (ref <18)
Troponin I (High Sensitivity): 2 ng/L (ref <18)

## 2020-11-14 LAB — PHOSPHORUS: Phosphorus: 5.1 mg/dL — ABNORMAL HIGH (ref 2.5–4.6)

## 2020-11-14 LAB — ETHANOL: Alcohol, Ethyl (B): <10 mg/dL (ref <10)

## 2020-11-14 LAB — CK: Total CK: 493 U/L — ABNORMAL HIGH (ref 49–397)

## 2020-11-14 LAB — MAGNESIUM: Magnesium: 2.1 mg/dL (ref 1.7–2.4)

## 2020-11-14 MED ORDER — POTASSIUM CHLORIDE IN NACL 20-0.9 MEQ/L-% IV SOLN: INTRAVENOUS | Status: DC

## 2020-11-14 MED ORDER — ALPRAZOLAM 1 MG PO TABS
1.0000 mg | ORAL_TABLET | Freq: Every day | ORAL | Status: DC
  Administered 2020-11-14 – 2020-11-16 (×3): 1 mg via ORAL
  Filled 2020-11-14 (×3): qty 1

## 2020-11-14 MED ORDER — SODIUM CHLORIDE 0.9 % IV BOLUS
1000.0000 mL | Freq: Once | INTRAVENOUS | Status: AC
  Administered 2020-11-14: 1000 mL via INTRAVENOUS

## 2020-11-14 MED ORDER — ENOXAPARIN SODIUM 40 MG/0.4ML IJ SOSY: 40.0000 mg | PREFILLED_SYRINGE | INTRAMUSCULAR | Status: DC

## 2020-11-14 MED ORDER — LORAZEPAM 2 MG/ML IJ SOLN
1.0000 mg | INTRAMUSCULAR | Status: DC | PRN
  Administered 2020-11-15 – 2020-11-16 (×2): 2 mg via INTRAVENOUS
  Filled 2020-11-14 (×2): qty 1

## 2020-11-14 MED ORDER — ADULT MULTIVITAMIN W/MINERALS CH
1.0000 | ORAL_TABLET | Freq: Every day | ORAL | Status: DC
  Administered 2020-11-15 – 2020-11-17 (×3): 1 via ORAL
  Filled 2020-11-14 (×3): qty 1

## 2020-11-14 MED ORDER — LORAZEPAM 1 MG PO TABS: 1.0000 mg | ORAL_TABLET | ORAL | Status: DC | PRN

## 2020-11-14 MED ORDER — MORPHINE SULFATE (PF) 2 MG/ML IV SOLN
2.0000 mg | INTRAVENOUS | Status: DC | PRN
  Administered 2020-11-15 (×2): 2 mg via INTRAVENOUS
  Filled 2020-11-14 (×2): qty 1

## 2020-11-14 MED ORDER — ENOXAPARIN SODIUM 30 MG/0.3ML IJ SOSY
30.0000 mg | PREFILLED_SYRINGE | INTRAMUSCULAR | Status: DC
  Administered 2020-11-14 – 2020-11-16 (×3): 30 mg via SUBCUTANEOUS
  Filled 2020-11-14 (×3): qty 0.3

## 2020-11-14 MED ORDER — FOLIC ACID 1 MG PO TABS
1.0000 mg | ORAL_TABLET | Freq: Every day | ORAL | Status: DC
  Administered 2020-11-15 – 2020-11-17 (×3): 1 mg via ORAL
  Filled 2020-11-14 (×3): qty 1

## 2020-11-14 MED ORDER — QUETIAPINE FUMARATE 25 MG PO TABS
50.0000 mg | ORAL_TABLET | Freq: Every day | ORAL | Status: DC
  Administered 2020-11-14 – 2020-11-16 (×3): 50 mg via ORAL
  Filled 2020-11-14 (×3): qty 2

## 2020-11-14 MED ORDER — POLYETHYLENE GLYCOL 3350 17 G PO PACK: 17.0000 g | PACK | Freq: Every day | ORAL | Status: DC | PRN

## 2020-11-14 MED ORDER — MORPHINE SULFATE (PF) 2 MG/ML IV SOLN: 2.0000 mg | INTRAVENOUS | Status: DC | PRN

## 2020-11-14 MED ORDER — THIAMINE HCL 100 MG/ML IJ SOLN: 100.0000 mg | Freq: Every day | INTRAMUSCULAR | Status: DC

## 2020-11-14 MED ORDER — ONDANSETRON HCL 4 MG PO TABS: 4.0000 mg | ORAL_TABLET | Freq: Four times a day (QID) | ORAL | Status: DC | PRN

## 2020-11-14 MED ORDER — THIAMINE HCL 100 MG PO TABS
100.0000 mg | ORAL_TABLET | Freq: Every day | ORAL | Status: DC
  Administered 2020-11-15 – 2020-11-17 (×3): 100 mg via ORAL
  Filled 2020-11-14 (×3): qty 1

## 2020-11-14 MED ORDER — AMPHETAMINE-DEXTROAMPHETAMINE 10 MG PO TABS
30.0000 mg | ORAL_TABLET | Freq: Two times a day (BID) | ORAL | Status: DC
  Administered 2020-11-15 – 2020-11-17 (×5): 30 mg via ORAL
  Filled 2020-11-14 (×5): qty 3

## 2020-11-14 MED ORDER — ACETAMINOPHEN 650 MG RE SUPP: 650.0000 mg | Freq: Four times a day (QID) | RECTAL | Status: DC | PRN

## 2020-11-14 MED ORDER — ACETAMINOPHEN 325 MG PO TABS: 650.0000 mg | ORAL_TABLET | Freq: Four times a day (QID) | ORAL | Status: DC | PRN

## 2020-11-14 MED ORDER — ONDANSETRON HCL 4 MG/2ML IJ SOLN: 4.0000 mg | Freq: Four times a day (QID) | INTRAMUSCULAR | Status: DC | PRN

## 2020-11-14 MED ORDER — NICOTINE 14 MG/24HR TD PT24
14.0000 mg | MEDICATED_PATCH | Freq: Every day | TRANSDERMAL | Status: DC
  Administered 2020-11-14 – 2020-11-17 (×4): 14 mg via TRANSDERMAL
  Filled 2020-11-14 (×4): qty 1

## 2020-11-14 MED ORDER — SODIUM CHLORIDE 0.9 % IV BOLUS
1000.0000 mL | Freq: Once | INTRAVENOUS | Status: AC
Start: 2020-11-14 — End: 2020-11-14
  Administered 2020-11-14: 1000 mL via INTRAVENOUS

## 2020-11-14 MED ORDER — MORPHINE SULFATE (PF) 2 MG/ML IV SOLN
2.0000 mg | Freq: Once | INTRAVENOUS | Status: AC
  Administered 2020-11-14: 2 mg via INTRAVENOUS
  Filled 2020-11-14: qty 1

## 2020-11-14 NOTE — H&P (Addendum)
History and Physical    [name] PYP:[id number] DOB: October 17, 1980 DOA: 11/14/2020  PCP: Gareth Morgan, MD   Patient coming from: Home  I have personally briefly reviewed patient's old medical records in [facility]  Chief Complaint: Chest pain  HPI: Alexander Pope is a 40 y.o. male with medical history significant for alcohol abuse, depression, anxiety.  Patient presented to the ED with complaints of chest pain of 4 days.  Patient's mother is present at bedside.  Chest pain is on the right side of his chest.  Chest pain is worse with breathing, and any movement.  He denies any actual difficulty breathing, he only reports that his chest pain gets worse when he takes a deep breath making it hard for him to breathe.  Prior to the chest pain he had no difficulty breathing.  Reports he woke up in the morning over the weekend with chest pain.  He does not think he fell out of bed, he denies any other falls, no trauma to his ribs, denies any fights or blows to the area. Smokes 1 pack of cigarettes daily.  He drinks alcohol daily, he reports 1-3 beers daily. (But on private discussion with patient's mother alone, she reports patient drinks more at least 4-6 big bottles of beer she is unsure of amount of blood probably about 30 ounces).  Patient lives with his 37 year old son. He denies vomiting, no loose stools, reports good oral intake.  No leg swelling.  He denies history of blood clots in lungs or legs.  History of blood clots in paternal uncle.  Patient presented to the urgent care, he was referred to the ED due to O2 sats of 87% on room air, hypotension and tachycardia.  ED Course: Temperature 98.5.  Initial tachycardia to 122 resolved now 85, O2 sats 90 to 93% on room air, blood pressure systolic 97/58.  Troponin 3.  WBC 16.5.  Elevated creatinine 3.42.  Chest x-ray shows nondisplaced fracture of the right lateral sixth rib.  EKG shows sinus tachycardia rate 122. 2 L bolus given.   Hospitalist to admit for acute kidney injury, rib fracture.  Review of Systems: As per HPI all other systems reviewed and negative.  Past Medical History:  Diagnosis Date   Anxiety    Back pain    Depression    Hypertension     Past Surgical History:  Procedure Laterality Date   BACK SURGERY       reports that he has been smoking cigarettes. He has been smoking an average of 1 pack per day. He has never used smokeless tobacco. He reports current alcohol use of about 6.0 standard drinks per week. He reports that he does not currently use drugs after having used the following drugs: Cocaine and Benzodiazepines.  No Known Allergies  Family History  Problem Relation Age of Onset   Stroke Mother     Prior to Admission medications   Medication Sig Start Date End Date Taking? Authorizing Provider  ALPRAZolam Prudy Feeler) 1 MG tablet Take 1 mg by mouth at bedtime as needed for anxiety.   Yes [provider]  amphetamine-dextroamphetamine (ADDERALL) 30 MG tablet Take 30 mg by mouth 2 (two) times daily.   Yes [provider]  gabapentin (NEURONTIN) 300 MG capsule Take 1 capsule (300 mg total) by mouth 3 (three) times daily. 10/13/14  Yes Rankin, Shuvon B, NP  INVEGA SUSTENNA 234 MG/1.5ML SUSY injection Inject 234 mg into the muscle every 30 (thirty)  days. 11/02/20  Yes [provider]  lisinopril-hydrochlorothiazide (ZESTORETIC) 20-12.5 MG tablet Take 1 tablet by mouth daily. 07/05/19  Yes Triplett, Tammy, PA-C  metoprolol tartrate (LOPRESSOR) 50 MG tablet Take 1 tablet (50 mg total) by mouth 2 (two) times daily. 07/05/19  Yes Triplett, Tammy, PA-C  oxyCODONE-acetaminophen (PERCOCET) 10-325 MG tablet Take 1 tablet by mouth 5 (five) times daily as needed for pain.   Yes [provider]  QUEtiapine (SEROQUEL) 50 MG tablet Take 1-2 tablets by mouth at bedtime. 11/08/20  Yes [provider]  doxycycline (VIBRAMYCIN) 100 MG capsule Take 1 capsule (100 mg total)  by mouth 2 (two) times daily. Patient not taking: No sig reported 12/26/19   Gerhard Munch, MD  DULoxetine (CYMBALTA) 60 MG capsule Take 1 capsule (60 mg total) by mouth daily. Patient not taking: No sig reported 10/13/14   Rankin, Shuvon B, NP  hydrochlorothiazide (HYDRODIURIL) 25 MG tablet Take 1 tablet (25 mg total) by mouth daily. 10/22/15 07/05/19  Ward, Layla Maw, DO    Physical Exam: Vitals:   11/14/20 1350 11/14/20 1400 11/14/20 1423 11/14/20 1500  BP:  105/63  99/60  Pulse:  95  85  Resp:  17  16  Temp: 98.5 F (36.9 C)     TempSrc: Oral     SpO2:  93% 93% 95%    Constitutional: Appears disheveled, appears very uncomfortable, due to rib fracture Vitals:   11/14/20 1350 11/14/20 1400 11/14/20 1423 11/14/20 1500  BP:  105/63  99/60  Pulse:  95  85  Resp:  17  16  Temp: 98.5 F (36.9 C)     TempSrc: Oral     SpO2:  93% 93% 95%   Eyes:  lids and conjunctivae normal ENMT: Mucous membranes are moist.   Neck: normal, supple, no masses, no thyromegaly Respiratory: Appears very uncomfortable, limiting movement of his chest with shallow respirations due to rib fracture, no wheezing, no crackles.  No accessory muscle use.  Cardiovascular: Regular rate and rhythm, no murmurs / rubs / gallops. No extremity edema. 2+ pedal pulses.  Abdomen: no tenderness, no masses palpated. No hepatosplenomegaly. Bowel sounds positive.  Musculoskeletal: no clubbing / cyanosis. No joint deformity upper and lower extremities. Good ROM, no contractures. Normal muscle tone.  Skin: Right chest area tender to palpation, no obvious bruising or discoloration suggesting physical trauma, no rashes, lesions, ulcers. No induration Neurologic: No apparent cranial nerve abnormality, moving all EXTR spontaneously.Marland Kitchen  Psychiatric: Normal judgment and insight. Alert and oriented x 3. Normal mood.   Labs on Admission: I have personally reviewed following labs and imaging studies  CBC: Recent Labs  Lab  11/14/20 1250  WBC 16.5*  NEUTROABS 13.5*  HGB 14.6  HCT 43.6  MCV 93.2  PLT 198   Basic Metabolic Panel: Recent Labs  Lab 11/14/20 1250  NA 133*  K 3.8  CL 99  CO2 21*  GLUCOSE 116*  BUN 27*  CREATININE 3.42*  CALCIUM 8.7*   GFR: CrCl cannot be calculated (Unknown ideal weight.). Liver Function Tests: Recent Labs  Lab 11/14/20 1250  AST 21  ALT 23  ALKPHOS 49  BILITOT 1.2  PROT 7.5  ALBUMIN 4.0   Radiological Exams on Admission: DG Chest Portable 1 View  Result Date: 11/14/2020 CLINICAL DATA:  Shortness of breath EXAM: PORTABLE CHEST 1 VIEW COMPARISON:  09/28/2019 FINDINGS: Right basilar linear airspace disease likely reflecting atelectasis. Trace right pleural effusion. No pneumothorax. Stable cardiomediastinal silhouette. Nondisplaced fracture of the right  sixth lateral rib. IMPRESSION: 1. Nondisplaced fracture of the right lateral sixth rib. Trace right pleural effusion and right basilar atelectasis. Electronically Signed   By: Elige Ko M.D.   On: 11/14/2020 13:07    EKG: Independently reviewed.  Sinus tachycardia rate 122.  QTc 443.  T wave changes in leads III aVF, V3 through V5  Assessment/Plan Principal Problem:   AKI (acute kidney injury) (HCC) Active Problems:   Alcohol dependence (HCC)   HTN (hypertension)   Chest pain   Acute kidney injury-creatinine elevated at 3.42, bedside records show creatinine checked 09/21/2020 was 1.3.  Baseline appears to be 1.1-1.4.  Denies GI losses.  Likely etiology lisinopril HCTZ,.  Systolic blood pressures below 100 suggesting prerenal etiology.   - 2 l bolus Given, continue N/s + 20 KCL 100cc/hr x 20hrs. -He is on 3 blood pressure medications, likely need to adjust doses or discontinue some medications. -CK mildly elevated 493.  Rib fracture with chest pain-etiology of rib fracture as yet unidentified.  He is unable to remember any form of trauma to the area.  Drinks alcohol daily, suspect alcohol intoxication and  fall might be etiology.  Chest x-ray shows nondisplaced fracture of the right lateral sixth rib.  EKG with some t wave changes in lead III, aVF, V3 through V5. -Needs pain control -Avoid NSAIDs with AKI -Repeat EKG in the morning  Acute respiratory failure- mild.  Sats 90 to 93% on room air. Likely due to chest splinting from rib fracture.  He is obviously uncomfortable and unable to breathe.  Denies history of DVT or PE. -Pain control IV morphine 2 mg every 4 hourly as needed for now -Incentive spirometry  Alcohol abuse-drinks 4-6 beers daily.  Last drink yesterday evening at about 6 PM., ~ 24hrs ago. Blood alcohol level < 10.  Potassium 3.8.  Magnesium 2.1.  Phosphorus 5.1.  Denies history of alcohol withdrawal or seizures. - CIWA PRN  -Thiamine, Folate multivitamins -UDS Pending  Leukocytosis 16.5.  No focus of infection identified at this time. - Trend.  Hypertension-systolic down to 30/94.  Likely secondary to antihypertensives. -Hold home lisinopril HCTZ, hold metoprolol, may need to adjust/discontinue on discharge  Depression/anxiety- he is on monthly Invega injections. -Resume Seroquel, daily nightly 1 mg Xanax, and Cymbalta.  Tobacco abuse, smokes 1 pack of cigarettes daily.   DVT prophylaxis: Lovenox Code Status: Full code Family Communication: Mother at bedside Disposition Plan:  ~ 2 days Consults called: None  Admission status: Obs, tele    Onnie Boer MD [facility]  11/14/2020, 5:26 PM

## 2020-11-14 NOTE — ED Provider Notes (Signed)
  [facility]   [id number] 11/14/20 Arrival Time: 1144  ASSESSMENT & PLAN:  1. Hypotension, unspecified hypotension type   2. Tachycardia   3. Respiratory distress    BP (!) 87/61   Pulse (!) 145   Resp (!) 40   SpO2 (!) 86%   Concern for sepsis. To ED via EMS. Stable upon discharge.  SUBJECTIVE: History from: patient and family.  Alexander Pope is a 40 y.o. male who presents with complaint of R sided rib/back pain; x 3-4 d. Now SOB. Denies rib injury/trauma. Mother reports alcohol abuse. Denies recreational/Rx drug use. H/O opioid abuse. Ques subj fever yesterday. Ambulatory here. Without emesis/diarrhea.  Social History   Tobacco Use  Smoking Status Every Day   Packs/day: 1.00   Types: Cigarettes  Smokeless Tobacco Never    OBJECTIVE:  Vitals:   11/14/20 1159  BP: (!) 87/61  Pulse: (!) 145  Resp: (!) 40  SpO2: (!) 86%    General appearance: alert; fatigued appearing HEENT: [place]; AT Neck: supple without LAD CV: reg; tachycardic Lungs: labored respirations, bilateral breath sounds present; active cough Skin: cool; sweaty Psychological: alert and cooperative; normal mood and affect  No Known Allergies  Past Medical History:  Diagnosis Date   Anxiety    Back pain    Depression    Hypertension    Family History  Problem Relation Age of Onset   Stroke Mother    Social History   Socioeconomic History   Marital status: Single    Spouse name: Not on file   Number of children: Not on file   Years of education: Not on file   Highest education level: Not on file  Occupational History   Not on file  Tobacco Use   Smoking status: Every Day    Packs/day: 1.00    Types: Cigarettes   Smokeless tobacco: Never  Vaping Use   Vaping Use: Never used  Substance and Sexual Activity   Alcohol use: Yes    Alcohol/week: 6.0 standard drinks    Types: 6 Cans of beer per week    Comment: occasionally    Drug use: Not Currently    Types: Cocaine,  Benzodiazepines    Comment: "Molly"   Sexual activity: Not Currently  Other Topics Concern   Not on file  Social History Narrative   Not on file   Social Determinants of Health   Financial Resource Strain: Not on file  Food Insecurity: Not on file  Transportation Needs: Not on file  Physical Activity: Not on file  Stress: Not on file  Social Connections: Not on file  Intimate Partner Violence: Not on file             Mardella Layman, MD 11/14/20 1210

## 2020-11-14 NOTE — ED Provider Notes (Signed)
[facility] EMERGENCY DEPARTMENT Provider Note   CSN: [id number] Arrival date & time: 11/14/20  1220     History Chief Complaint  Patient presents with   Shortness of Breath    Alexander Pope is a 40 y.o. male.   Shortness of Breath Associated symptoms: chest pain, cough and wheezing   Associated symptoms: no abdominal pain, no ear pain, no fever, no rash, no sore throat and no vomiting    Patient presents from urgent care to ED with shortness of breath and right-sided chest pain.  Patient reports that started acutely about 4 days ago but worsened today.  The pain under the right side of his chest has been constant, is worsened by inspiration.  Does not radiate to his arm, back, neck.  There is also associated cough and shortness of breath and wheezing.  He denies any nausea or vomiting, denies any swelling in his legs.  Patient has a history of alcohol and narcotic abuse, states he does not use any narcotics get those prescribed to him.  Denies any cocaine use today yesterday.  Last drink was yesterday.  No history of stroke or MI, patient does state he had a DVT at some point in the past but is not on any blood thinners.  He is a daily smoker.  Patient was sent straight from urgent care due to hypoxia of 87% on room air.  He was also having hypotension and tachycardia.     Past Medical History:  Diagnosis Date   Anxiety    Back pain    Depression    Hypertension     Patient Active Problem List   Diagnosis Date Noted   Alcohol dependence with uncomplicated withdrawal (HCC)    Opioid dependence with opioid-induced mood disorder (HCC)    Alcohol dependence (HCC) 10/10/2014   Opioid dependence (HCC) 10/10/2014   Substance induced mood disorder (HCC) 10/10/2014    Past Surgical History:  Procedure Laterality Date   BACK SURGERY         Family History  Problem Relation Age of Onset   Stroke Mother     Social History   Tobacco Use   Smoking status: Every Day     Packs/day: 1.00    Types: Cigarettes   Smokeless tobacco: Never  Vaping Use   Vaping Use: Never used  Substance Use Topics   Alcohol use: Yes    Alcohol/week: 6.0 standard drinks    Types: 6 Cans of beer per week    Comment: occasionally    Drug use: Not Currently    Types: Cocaine, Benzodiazepines    Comment: "Molly"    Home Medications Prior to Admission medications   Medication Sig Start Date End Date Taking? Authorizing Provider  ALPRAZolam Prudy Feeler) 1 MG tablet Take 1 mg by mouth at bedtime as needed for anxiety.    [provider]  amphetamine-dextroamphetamine (ADDERALL) 30 MG tablet Take 30 mg by mouth 2 (two) times daily.    [provider]  DiphenhydrAMINE HCl (BENADRYL PO) Take by mouth.    [provider]  doxycycline (VIBRAMYCIN) 100 MG capsule Take 1 capsule (100 mg total) by mouth 2 (two) times daily. 12/26/19   Gerhard Munch, MD  DULoxetine (CYMBALTA) 60 MG capsule Take 1 capsule (60 mg total) by mouth daily. 10/13/14   Rankin, Shuvon B, NP  gabapentin (NEURONTIN) 300 MG capsule Take 1 capsule (300 mg total) by mouth 3 (three) times daily. 10/13/14   Rankin, Rada Hay, NP  lisinopril-hydrochlorothiazide (ZESTORETIC) 20-12.5 MG tablet Take 1 tablet by mouth daily. 07/05/19   Triplett, Tammy, PA-C  metoprolol tartrate (LOPRESSOR) 50 MG tablet Take 1 tablet (50 mg total) by mouth 2 (two) times daily. 07/05/19   Triplett, Tammy, PA-C  oxyCODONE-acetaminophen (PERCOCET) 10-325 MG tablet Take 1 tablet by mouth 5 (five) times daily as needed for pain.    [provider]  traZODone (DESYREL) 50 MG tablet Take 150 mg by mouth at bedtime.     [provider]  hydrochlorothiazide (HYDRODIURIL) 25 MG tablet Take 1 tablet (25 mg total) by mouth daily. 10/22/15 07/05/19  Ward, Layla Maw, DO    Allergies    Patient has no known allergies.  Review of Systems   Review of Systems  Constitutional:  Negative for chills and fever.  HENT:  Negative  for ear pain and sore throat.   Eyes:  Negative for pain and visual disturbance.  Respiratory:  Positive for cough, shortness of breath and wheezing.   Cardiovascular:  Positive for chest pain. Negative for palpitations and leg swelling.  Gastrointestinal:  Negative for abdominal pain, rectal pain and vomiting.  Genitourinary:  Negative for dysuria and hematuria.  Musculoskeletal:  Negative for arthralgias and back pain.  Skin:  Negative for color change and rash.  Neurological:  Positive for light-headedness. Negative for dizziness, seizures and syncope.  All other systems reviewed and are negative.  Physical Exam Updated Vital Signs BP (!) 97/58 (BP Location: Right Arm)   Pulse (!) 122   Resp (!) 25   SpO2 93%   Physical Exam Vitals and nursing note reviewed. Exam conducted with a chaperone present.  Constitutional:      Appearance: Normal appearance. He is ill-appearing.     Comments: Patient is tachypneic, increased work of breathing.  HENT:     Head: Normocephalic and atraumatic.  Eyes:     General: No scleral icterus.       Right eye: No discharge.        Left eye: No discharge.     Extraocular Movements: Extraocular movements intact.     Pupils: Pupils are equal, round, and reactive to light.  Cardiovascular:     Rate and Rhythm: Regular rhythm. Tachycardia present.     Pulses: Normal pulses.     Heart sounds: Normal heart sounds. No murmur heard.   No friction rub. No gallop.     Comments: Radial pulses are 2+ and equal bilaterally. Pulmonary:     Effort: Tachypnea and respiratory distress present.     Comments: Patient satting about 91% on room air.  Patient is tachypneic, increased labored breathing.  Lung sounds are present bilaterally Abdominal:     General: Abdomen is flat. Bowel sounds are normal. There is no distension.     Palpations: Abdomen is soft.     Tenderness: There is no abdominal tenderness.  Musculoskeletal:     Right lower leg: No edema.      Left lower leg: No edema.  Skin:    General: Skin is warm and dry.     Coloration: Skin is not jaundiced.  Neurological:     Mental Status: He is alert. Mental status is at baseline.     Coordination: Coordination normal.    ED Results / Procedures / Treatments   Labs (all labs ordered are listed, but only abnormal results are displayed) Labs Reviewed  RESP PANEL BY RT-PCR (FLU A&B, COVID) [id number]  CBC WITH DIFFERENTIAL/PLATELET  COMPREHENSIVE METABOLIC PANEL  URINALYSIS, ROUTINE W REFLEX MICROSCOPIC  RAPID URINE DRUG SCREEN, HOSP PERFORMED  TROPONIN I (HIGH SENSITIVITY)    EKG None  Radiology No results found.  Procedures Procedures   Medications Ordered in ED Medications  sodium chloride 0.9 % bolus 1,000 mL (has no administration in time range)    ED Course  I have reviewed the triage vital signs and the nursing notes.  Pertinent labs & imaging results that were available during my care of the patient were reviewed by me and considered in my medical decision making (see chart for details).    MDM Rules/Calculators/A&P                           Patient presented with hypoxia, tachycardia, hypotension from urgent care.  Initial immediate concern with possible PE, started with radiograph.  Considered pneumothorax but lung sounds were present in all fields.    Patient heart rate, oxygen saturation and blood pressure have improved with fluids, rest, intubation.  Patient does have a rib fracture which is likely the source of his pain presentation.  He does have a leukocytosis, no fever.  Patient has an AKI, GFR is 22 so cannot proceed with CTA.  Overall in the setting of rib fracture in the improvement of vital signs think PE is less likely.  Patient will need admission for AKI, will consult the hospitalist.  Spoke with hospitalist Dr. Mariea Clonts who will admit the patient.  Final Clinical Impression(s) / ED Diagnoses Final diagnoses:  None    Rx / DC Orders ED  Discharge Orders     None        Theron Arista, PA-C 11/14/20 1720    Cathren Laine, MD 11/15/20 1358

## 2020-11-14 NOTE — ED Notes (Signed)
Patient is being discharged from the Urgent Care and sent to the Emergency Department via EMS . Per Dr. Tracie Harrier, patient is in need of higher level of care due to hypoxia, hypotension, tachypnea. Patient is aware and verbalizes understanding of plan of care. NS bolus transfusing en route. Vitals:   11/14/20 1159  BP: (!) 87/61  Pulse: (!) 145  Resp: (!) 40  SpO2: (!) 86%

## 2020-11-14 NOTE — Progress Notes (Signed)
Gave patient an Facilities manager.  Patient complained about using it but I did get him to do X4, patient began to cough and breathe quickly.  Encouraged him to take a deep breath and relax.  Left IS at bedside for patient to use later.

## 2020-11-14 NOTE — ED Triage Notes (Addendum)
Pt reports SHOB, hypotension, and rib pain x 4 days. PT sent from urgent care for further work up. Pt 93% on RA at this time. Pt brought by [facility]. Pt received 200 ml NaCl en route.

## 2020-11-14 NOTE — ED Notes (Signed)
Pt placed on 2 L [place], #20 gauge placed in left fore arm, NS fluid bolus started. Dr. Tracie Harrier at bedside. EMS called for transport. Patients mother called to come in for an update.

## 2020-11-14 NOTE — ED Triage Notes (Signed)
Pt presents with complaints of shortness of breath and pain in his ribs x 4 days. Pt is breathing fast during intake during intake. Pulse ox 87%

## 2020-11-14 NOTE — Progress Notes (Signed)
Pt arrived to his room. Complains of pain 8/10 one time dose of morphine given. Pt instructed to use call light when he needs to ambulate.

## 2020-11-15 DIAGNOSIS — E871 Hypo-osmolality and hyponatremia: Secondary | ICD-10-CM | POA: Diagnosis not present

## 2020-11-15 DIAGNOSIS — F1210 Cannabis abuse, uncomplicated: Secondary | ICD-10-CM | POA: Diagnosis not present

## 2020-11-15 DIAGNOSIS — J9601 Acute respiratory failure with hypoxia: Secondary | ICD-10-CM | POA: Diagnosis not present

## 2020-11-15 DIAGNOSIS — N179 Acute kidney failure, unspecified: Secondary | ICD-10-CM | POA: Diagnosis not present

## 2020-11-15 DIAGNOSIS — F17210 Nicotine dependence, cigarettes, uncomplicated: Secondary | ICD-10-CM | POA: Diagnosis present

## 2020-11-15 DIAGNOSIS — S2231XA Fracture of one rib, right side, initial encounter for closed fracture: Secondary | ICD-10-CM | POA: Diagnosis not present

## 2020-11-15 DIAGNOSIS — F419 Anxiety disorder, unspecified: Secondary | ICD-10-CM | POA: Diagnosis present

## 2020-11-15 DIAGNOSIS — E860 Dehydration: Secondary | ICD-10-CM | POA: Diagnosis present

## 2020-11-15 DIAGNOSIS — D72828 Other elevated white blood cell count: Secondary | ICD-10-CM | POA: Diagnosis present

## 2020-11-15 DIAGNOSIS — W1830XA Fall on same level, unspecified, initial encounter: Secondary | ICD-10-CM | POA: Diagnosis present

## 2020-11-15 DIAGNOSIS — Y92009 Unspecified place in unspecified non-institutional (private) residence as the place of occurrence of the external cause: Secondary | ICD-10-CM | POA: Diagnosis not present

## 2020-11-15 DIAGNOSIS — F1510 Other stimulant abuse, uncomplicated: Secondary | ICD-10-CM | POA: Diagnosis not present

## 2020-11-15 DIAGNOSIS — Z20822 Contact with and (suspected) exposure to covid-19: Secondary | ICD-10-CM | POA: Diagnosis not present

## 2020-11-15 DIAGNOSIS — F10239 Alcohol dependence with withdrawal, unspecified: Secondary | ICD-10-CM | POA: Diagnosis not present

## 2020-11-15 DIAGNOSIS — I10 Essential (primary) hypertension: Secondary | ICD-10-CM | POA: Diagnosis not present

## 2020-11-15 DIAGNOSIS — F1120 Opioid dependence, uncomplicated: Secondary | ICD-10-CM | POA: Diagnosis not present

## 2020-11-15 DIAGNOSIS — F1310 Sedative, hypnotic or anxiolytic abuse, uncomplicated: Secondary | ICD-10-CM | POA: Diagnosis not present

## 2020-11-15 DIAGNOSIS — F32A Depression, unspecified: Secondary | ICD-10-CM | POA: Diagnosis not present

## 2020-11-15 DIAGNOSIS — Z79899 Other long term (current) drug therapy: Secondary | ICD-10-CM | POA: Diagnosis not present

## 2020-11-15 LAB — BASIC METABOLIC PANEL
Anion gap: 7 (ref 5–15)
BUN: 19 mg/dL (ref 6–20)
CO2: 24 mmol/L (ref 22–32)
Calcium: 8.5 mg/dL — ABNORMAL LOW (ref 8.9–10.3)
Chloride: 103 mmol/L (ref 98–111)
Creatinine, Ser: 1.66 mg/dL — ABNORMAL HIGH (ref 0.61–1.24)
GFR, Estimated: 53 mL/min — ABNORMAL LOW (ref >60)
Glucose, Bld: 109 mg/dL — ABNORMAL HIGH (ref 70–99)
Potassium: 4.4 mmol/L (ref 3.5–5.1)
Sodium: 134 mmol/L — ABNORMAL LOW (ref 135–145)

## 2020-11-15 LAB — CBC
HCT: 39.7 % (ref 39.0–52.0)
Hemoglobin: 13.2 g/dL (ref 13.0–17.0)
MCH: 31.6 pg (ref 26.0–34.0)
MCHC: 33.2 g/dL (ref 30.0–36.0)
MCV: 95 fL (ref 80.0–100.0)
Platelets: 191 10*3/uL (ref 150–400)
RBC: 4.18 MIL/uL — ABNORMAL LOW (ref 4.22–5.81)
RDW: 13.2 % (ref 11.5–15.5)
WBC: 11 10*3/uL — ABNORMAL HIGH (ref 4.0–10.5)
nRBC: 0 % (ref 0.0–0.2)

## 2020-11-15 LAB — HIV ANTIBODY (ROUTINE TESTING W REFLEX): HIV Screen 4th Generation wRfx: NONREACTIVE

## 2020-11-15 MED ORDER — HYDROCODONE-ACETAMINOPHEN 5-325 MG PO TABS
1.0000 | ORAL_TABLET | ORAL | Status: DC | PRN
Start: 2020-11-15 — End: 2020-11-17
  Administered 2020-11-15 – 2020-11-17 (×4): 1 via ORAL
  Filled 2020-11-15 (×4): qty 1

## 2020-11-15 MED ORDER — LIDOCAINE 5 % EX PTCH
1.0000 | MEDICATED_PATCH | CUTANEOUS | Status: DC
  Administered 2020-11-15 – 2020-11-17 (×3): 1 via TRANSDERMAL
  Filled 2020-11-15 (×3): qty 1

## 2020-11-15 MED ORDER — POTASSIUM CHLORIDE IN NACL 20-0.9 MEQ/L-% IV SOLN: INTRAVENOUS | Status: DC

## 2020-11-15 MED ORDER — MORPHINE SULFATE (PF) 2 MG/ML IV SOLN
2.0000 mg | INTRAVENOUS | Status: DC | PRN
  Administered 2020-11-15 – 2020-11-17 (×7): 2 mg via INTRAVENOUS
  Filled 2020-11-15 (×10): qty 1

## 2020-11-15 NOTE — TOC Initial Note (Signed)
Transition of Care [facility]) - Initial/Assessment Note    Patient Details  Name: Alexander Pope MRN: [id number] Date of Birth: Nov 24, 1980  Transition of [facility] CM/SW Contact:    Villa Herb, LCSWA Phone Number: 11/15/2020, 11:12 AM  Clinical Narrative:                 [facility] consulted for substance use counseling. CSW spoke with pt about interest in substance use resources. Pt states he is okay and does not feel he needs them at this time. CSW informed pt that if he changes his mind at any time we can provide the resources to him in the room. TOC to follow.   Expected Discharge Plan: Home/Self Care Barriers to Discharge: Continued Medical Work up   Patient Goals and CMS Choice Patient states their goals for this hospitalization and ongoing recovery are:: Return home CMS Medicare.gov Compare Post Acute Care list provided to:: Patient Choice offered to / list presented to : Patient  Expected Discharge Plan and Services Expected Discharge Plan: Home/Self Care In-house Referral: Clinical Social Work Discharge Planning Services: CM Consult   Living arrangements for the past 2 months: Single Family Home                                      Prior Living Arrangements/Services Living arrangements for the past 2 months: Single Family Home Lives with:: Minor Children Patient language and need for interpreter reviewed:: Yes Do you feel safe going back to the place where you live?: Yes      Need for Family Participation in Patient Care: Yes (Comment) Care giver support system in place?: Yes (comment)   Criminal Activity/Legal Involvement Pertinent to Current Situation/Hospitalization: No - Comment as needed  Activities of Daily Living Home Assistive Devices/Equipment: None ADL Screening (condition at time of admission) Patient's cognitive ability adequate to safely complete daily activities?: Yes Is the patient deaf or have difficulty hearing?: No Does the patient have  difficulty seeing, even when wearing glasses/contacts?: Yes Does the patient have difficulty concentrating, remembering, or making decisions?: Yes Patient able to express need for assistance with ADLs?: Yes Does the patient have difficulty dressing or bathing?: No Independently performs ADLs?: Yes (appropriate for developmental age) Does the patient have difficulty walking or climbing stairs?: Yes Weakness of Legs: Both Weakness of Arms/Hands: Both  Permission Sought/Granted                  Emotional Assessment Appearance:: Appears stated age Attitude/Demeanor/Rapport: Engaged Affect (typically observed): Accepting Orientation: : Oriented to Self, Oriented to Place, Oriented to  Time, Oriented to Situation Alcohol / Substance Use: Not Applicable Psych Involvement: No (comment)  Admission diagnosis:  AKI (acute kidney injury) (HCC) [N17.9] Patient Active Problem List   Diagnosis Date Noted   AKI (acute kidney injury) (HCC) 11/14/2020   Chest pain 11/14/2020   HTN (hypertension) 11/14/2020   Tobacco abuse 11/14/2020   Alcohol dependence with uncomplicated withdrawal (HCC)    Opioid dependence with opioid-induced mood disorder (HCC)    Alcohol dependence (HCC) 10/10/2014   Opioid dependence (HCC) 10/10/2014   Substance induced mood disorder (HCC) 10/10/2014   PCP:  Gareth Morgan, MD Pharmacy:   [facility] - [place], [place] - [address] [place] [address] Phone: [phone] Fax: [phone]     Social Determinants of Health (SDOH) Interventions    Readmission  Risk Interventions No flowsheet data found.

## 2020-11-15 NOTE — Progress Notes (Signed)
Nutrition Brief Note  Patient identified on the Malnutrition Screening Tool (MST) Report  Patient presents with chest pain. Hx of ETOH, tobacco and polysubstance abuse, depression, anxiety.   Wt Readings from Last 15 Encounters:  12/26/19 102.1 kg  07/04/19 99.8 kg  03/03/17 93.4 kg  11/26/16 95.3 kg  10/21/15 95.3 kg  05/10/15 90.7 kg  10/08/14 86.2 kg  01/12/14 93 kg  01/14/11 90.7 kg   Nursing obtained a current weight today of  239.6 lb (108.9 kg)  There is no height or weight on file to calculate BMI. Patient meets criteria for obese based on current BMI.   Current diet order is regular, patient is consuming approximately 40% of meals at this time.  Labs and medications reviewed.  BMP Latest Ref Rng & Units 11/15/2020 11/14/2020 12/26/2019  Glucose 70 - 99 mg/dL 496(P) 591(M) 384(Y)  BUN 6 - 20 mg/dL 19 65(L) 17  Creatinine 0.61 - 1.24 mg/dL 9.35(T) 0.17(B) 9.39(Q)  Sodium 135 - 145 mmol/L 134(L) 133(L) 137  Potassium 3.5 - 5.1 mmol/L 4.4 3.8 3.9  Chloride 98 - 111 mmol/L 103 99 102  CO2 22 - 32 mmol/L 24 21(L) 21(L)  Calcium 8.9 - 10.3 mg/dL 3.0(S) 9.2(Z) 9.8     No nutrition interventions warranted at this time. If nutrition issues arise, please consult RD.   Royann Shivers MS,RD,CSG,LDN Contact: Loretha Stapler

## 2020-11-15 NOTE — Progress Notes (Addendum)
PROGRESS NOTE    Alexander Pope  OXB:[id number] DOB: 1980/03/06 DOA: 11/14/2020 PCP: Gareth Morgan, MD    Brief Narrative:  Alexander Pope is a 40 year old male with past medical history significant for EtOH abuse, depression/anxiety who presented to [facility] ED on 10/5 with complaints of chest pain, shortness of breath.  Patient reports onset 4 days ago, chest pain worse with deep inspiration and any type of movement.  He reports he awoke with symptoms over the weekend.  Denies falling out of bed, no known falls or trauma.  Denies nausea/vomiting, no diarrhea or bloody stools.  Patient endorses 1 pack of cigarettes daily, daily alcohol use with reported 1-3 beers daily (but with discussion with patient's mother alone she reports patient drinks at least 4-6 big bottles of beer daily).   In the ED, temperature 98.5 F, HR 122, RR 25, BP 97/58, SPO2 93% on room air.  Sodium 133, potassium 3.8, chloride 99, CO2 21, BUN 27, creatinine 3.42, glucose 116.  AST 21, ALT 23, total bilirubin 1.2.  WBC 16.5, hemoglobin 14.6, platelets 198.  High sensitive troponin 3>2.  COVID-19 PCR negative.  Influenza A/B PCR negative.  Urinalysis unrevealing.  UDS positive for amphetamines, benzodiazepines, opiates, THC.  Chest x-ray with nondisplaced fracture right lateral sixth rib, trace pleural effusion and right basilar atelectasis.  EKG with normal sinus rhythm, rate 122, nonspecific T wave changes.  EDP consulted [facility] for further evaluation and management of acute renal failure, rib fracture.   Assessment & Plan:   Principal Problem:   AKI (acute kidney injury) (HCC) Active Problems:   Alcohol dependence (HCC)   Chest pain   HTN (hypertension)   Tobacco abuse   Acute renal failure: Improving Patient presenting to ED with chest pain, was found to have an elevated creatinine of 3.42.  Continues with significant EtOH abuse, likely secondary to prerenal azotemia in the setting of dehydration. --Cr  3.42>1.66 --Continue IV fluid hydration with NS with 20 KCL at 174mL/hr --Avoid nephrotoxins, renally dose all medications --Repeat renal function in a.m.  EtOH withdrawal, acute Patient continues to endorse daily alcohol use with beer, although reports much less than mother reports.  Denies any history of withdrawal seizures or significant DTs. EtOH level less than 10 on arrival.  Patient has developed significant diaphoresis likely secondary to active withdrawal symptoms. --Counseled patient on need for complete cessation --CIWA protocol with symptom triggered ativan --Thiamine, multivitamin, folic acid --Continue monitor on telemetry  Acute respiratory failure, POA On arrival to the ED, SPO2 90% on room air; and placed on 3 L nasal cannula chest x-ray with right lateral sixth rib fracture with right basilar atelectasis; likely secondary to splinting from rib fracture. --Continue supple oxygen, maintain SPO2 greater than 92%; currently on 3 L nasal cannula with SPO2 98% --Wean oxygen to room air, ambulatory O2 assessment today  Rib fracture with chest pain Patient reports right-sided chest pain.  High sensitive troponin negative.  EKG with sinus tachycardia, nonspecific T wave changes.  Denies any known history of trauma to the area, but likely poor historian given his continued alcohol and substance abuse.  Suspect alcohol intoxication with fall as etiology.  Chest x-ray with nondisplaced fracture right lateral sixth rib. --Norco 5-325 mg p.o. every 4 hours as needed moderate pain --Morphine 2 mg IV every 4 hours as needed severe pain --Lidocaine patch --Incentive spirometry  Leukocytosis, likely reactive WBC 16.5 admission, chest x-ray without focal consolidation, urinalysis unrevealing.  No focus of  infection at this time.  Etiology likely secondary to concentration/reactive in the setting of poor oral intake, dehydration and continued alcohol abuse. --WBC 16.5>11.0 --Continue IV  fluid hydration --Repeat CBC in a.m.  Hyponatremia Sodium 133 on admission, likely secondary to beer potomania from significant EtOH abuse.  Counseled on need for complete cessation. --Na 133>134 --continue IVF hydration --BMP daily  Polysubstance abuse UDS positive for amphetamines, benzodiazepines, opiates, and THC.  EtOH level less than 10 on arrival.   --Counseled patient on need for complete cessation  Tobacco use disorder Continues to endorse 1 pack of cigarettes daily.  Counseled for need for complete cessation. --Nicotine patch  Depression/anxiety Patient on monthly Invega injections. --Seroquel 50 mg p.o. nightly --Xanax 1 mg p.o. nightly --Cymbalta   DVT prophylaxis: enoxaparin (LOVENOX) injection 30 mg Start: 11/14/20 2000   Code Status: Full Code Family Communication: Updated patient's mother who is present at bedside this morning  Disposition Plan:  Level of care: Telemetry Status is: Observation  The patient will require care spanning > 2 midnights and should be moved to inpatient because: Persistent severe electrolyte disturbances, Ongoing active pain requiring inpatient pain management, Ongoing diagnostic testing needed not appropriate for outpatient work up, Unsafe d/c plan, IV treatments appropriate due to intensity of illness or inability to take PO, and Inpatient level of care appropriate due to severity of illness  Dispo: The patient is from: Home              Anticipated d/c is to: Home              Patient currently is not medically stable to d/c.   Difficult to place patient No   Consultants:  None  Procedures:  None  Antimicrobials:  None   Subjective: Patient seen examined at bedside, resting comfortably in bed.  Mother present at bedside.  Significantly diaphoretic, likely undergoing active withdrawals from alcohol.  Patient continues to be unclear how he sustained rib fracture.  States he normally sleeps on the couch.  Continues with  right-sided pain from rib fracture.  Remains on 3 L nasal cannula with SPO2 98%.  Shortness of breath worse with deep inspiration.  Discussed with him needs to continue to use incentive spirometer at bedside.  Denies history of significant DTs or seizures, but concern with diaphoresis that he is starting to actively withdrawal.  Creatinine improving but not back at baseline with IV fluids.  Updated mother on plan of care, no other questions or concerns at this time.  Denies headache, no dizziness, no current chest pain, no abdominal pain, no fever/chills/night sweats, no nausea/vomiting/diarrhea.  Objective: Vitals:   11/14/20 1714 11/14/20 2031 11/15/20 0000 11/15/20 0400  BP: 106/70 106/71 104/65 (!) 128/52  Pulse: 94 86 80 100  Resp: 20 19 18 20   Temp:  97.9 F (36.6 C) 98 F (36.7 C) 99.6 F (37.6 C)  TempSrc:  Oral Oral Oral  SpO2: 98% 97% 98% 98%    Intake/Output Summary (Last 24 hours) at 11/15/2020 1201 Last data filed at 11/15/2020 0900 Gross per 24 hour  Intake 1197.64 ml  Output --  Net 1197.64 ml   There were no vitals filed for this visit.  Examination:  General exam: Appears calm and comfortable, diaphoretic, anxious, appears older than stated age, disheveled in appearance Respiratory system: Clear to auscultation but poor inspiratory effort due to pain, no accessory muscle use, on 3 L nasal cannula with SPO2 98% Cardiovascular system: S1 & S2 heard, RRR. No  JVD, murmurs, rubs, gallops or clicks. No pedal edema. Gastrointestinal system: Abdomen is nondistended, soft and nontender. No organomegaly or masses felt. Normal bowel sounds heard. Central nervous system: Alert and oriented. No focal neurological deficits. Extremities: Symmetric 5 x 5 power. Skin: No rashes, lesions or ulcers Psychiatry: Judgement and insight appear poor.  Anxious mood.    Data Reviewed: I have personally reviewed following labs and imaging studies  CBC: Recent Labs  Lab 11/14/20 1250  11/15/20 0634  WBC 16.5* 11.0*  NEUTROABS 13.5*  --   HGB 14.6 13.2  HCT 43.6 39.7  MCV 93.2 95.0  PLT 198 191   Basic Metabolic Panel: Recent Labs  Lab 11/14/20 1250 11/14/20 1600 11/15/20 0634  NA 133*  --  134*  K 3.8  --  4.4  CL 99  --  103  CO2 21*  --  24  GLUCOSE 116*  --  109*  BUN 27*  --  19  CREATININE 3.42*  --  1.66*  CALCIUM 8.7*  --  8.5*  MG  --  2.1  --   PHOS  --  5.1*  --    GFR: CrCl cannot be calculated (Unknown ideal weight.). Liver Function Tests: Recent Labs  Lab 11/14/20 1250  AST 21  ALT 23  ALKPHOS 49  BILITOT 1.2  PROT 7.5  ALBUMIN 4.0   No results for input(s): LIPASE, AMYLASE in the last 168 hours. No results for input(s): AMMONIA in the last 168 hours. Coagulation Profile: No results for input(s): INR, PROTIME in the last 168 hours. Cardiac Enzymes: Recent Labs  Lab 11/14/20 1600  CKTOTAL 493*   BNP (last 3 results) No results for input(s): PROBNP in the last 8760 hours. HbA1C: No results for input(s): HGBA1C in the last 72 hours. CBG: No results for input(s): GLUCAP in the last 168 hours. Lipid Profile: No results for input(s): CHOL, HDL, LDLCALC, TRIG, CHOLHDL, LDLDIRECT in the last 72 hours. Thyroid Function Tests: No results for input(s): TSH, T4TOTAL, FREET4, T3FREE, THYROIDAB in the last 72 hours. Anemia Panel: No results for input(s): VITAMINB12, FOLATE, FERRITIN, TIBC, IRON, RETICCTPCT in the last 72 hours. Sepsis Labs: No results for input(s): PROCALCITON, LATICACIDVEN in the last 168 hours.  Recent Results (from the past 240 hour(s))  Resp Panel by RT-PCR (Flu A&B, Covid) Nasopharyngeal Swab     Status: None   Collection Time: 11/14/20 12:41 PM   Specimen: Nasopharyngeal Swab; Nasopharyngeal(NP) swabs in vial transport medium  Result Value Ref Range Status   SARS Coronavirus 2 by RT PCR NEGATIVE NEGATIVE Final    Comment: (NOTE) SARS-CoV-2 target nucleic acids are NOT DETECTED.  The SARS-CoV-2 RNA is  generally detectable in upper respiratory specimens during the acute phase of infection. The lowest concentration of SARS-CoV-2 viral copies this assay can detect is 138 copies/mL. A negative result does not preclude SARS-Cov-2 infection and should not be used as the sole basis for treatment or other patient management decisions. A negative result may occur with  improper specimen collection/handling, submission of specimen other than nasopharyngeal swab, presence of viral mutation(s) within the areas targeted by this assay, and inadequate number of viral copies(<138 copies/mL). A negative result must be combined with clinical observations, patient history, and epidemiological information. The expected result is Negative.  Fact Sheet for Patients:  BloggerCourse.com  Fact Sheet for Healthcare Providers:  SeriousBroker.it  This test is no t yet approved or cleared by the [place] and  has been authorized for  detection and/or diagnosis of SARS-CoV-2 by FDA under an Emergency Use Authorization (EUA). This EUA will remain  in effect (meaning this test can be used) for the duration of the COVID-19 declaration under Section 564(b)(1) of the Act, 21 U.S.C.section 360bbb-3(b)(1), unless the authorization is terminated  or revoked sooner.       Influenza A by PCR NEGATIVE NEGATIVE Final   Influenza B by PCR NEGATIVE NEGATIVE Final    Comment: (NOTE) The Xpert Xpress SARS-CoV-2/FLU/RSV plus assay is intended as an aid in the diagnosis of influenza from Nasopharyngeal swab specimens and should not be used as a sole basis for treatment. Nasal washings and aspirates are unacceptable for Xpert Xpress SARS-CoV-2/FLU/RSV testing.  Fact Sheet for Patients: BloggerCourse.com  Fact Sheet for Healthcare Providers: SeriousBroker.it  This test is not yet approved or cleared by the [place] FDA and has been authorized for detection and/or diagnosis of SARS-CoV-2 by FDA under an Emergency Use Authorization (EUA). This EUA will remain in effect (meaning this test can be used) for the duration of the COVID-19 declaration under Section 564(b)(1) of the Act, 21 U.S.C. section 360bbb-3(b)(1), unless the authorization is terminated or revoked.  Performed at [facility], [address], [place], [place] [address]          Radiology Studies: DG Chest Portable 1 View  Result Date: 11/14/2020 CLINICAL DATA:  Shortness of breath EXAM: PORTABLE CHEST 1 VIEW COMPARISON:  09/28/2019 FINDINGS: Right basilar linear airspace disease likely reflecting atelectasis. Trace right pleural effusion. No pneumothorax. Stable cardiomediastinal silhouette. Nondisplaced fracture of the right sixth lateral rib. IMPRESSION: 1. Nondisplaced fracture of the right lateral sixth rib. Trace right pleural effusion and right basilar atelectasis. Electronically Signed   By: Elige Ko M.D.   On: 11/14/2020 13:07        Scheduled Meds:  ALPRAZolam  1 mg Oral QHS   amphetamine-dextroamphetamine  30 mg Oral BID   enoxaparin (LOVENOX) injection  30 mg Subcutaneous Q24H   folic acid  1 mg Oral Daily   lidocaine  1 patch Transdermal Q24H   multivitamin with minerals  1 tablet Oral Daily   nicotine  14 mg Transdermal Daily   QUEtiapine  50 mg Oral QHS   thiamine  100 mg Oral Daily   Or   thiamine  100 mg Intravenous Daily   Continuous Infusions:  0.9 % NaCl with KCl 20 mEq / L 100 mL/hr at 11/15/20 0359     LOS: 0 days    Time spent: 39 minutes spent on chart review, discussion with nursing staff, consultants, updating family and interview/physical exam; more than 50% of that time was spent in counseling and/or coordination of care.    Alvira Philips [place], DO [facility] Available via Epic secure chat 7am-7pm After these hours, please refer to coverage provider listed on  [email or web address] 11/15/2020, 12:01 PM

## 2020-11-16 ENCOUNTER — Encounter (HOSPITAL_COMMUNITY): Payer: Self-pay | Admitting: Internal Medicine

## 2020-11-16 LAB — CBC
HCT: 37.7 % — ABNORMAL LOW (ref 39.0–52.0)
Hemoglobin: 12.1 g/dL — ABNORMAL LOW (ref 13.0–17.0)
MCH: 30.9 pg (ref 26.0–34.0)
MCHC: 32.1 g/dL (ref 30.0–36.0)
MCV: 96.2 fL (ref 80.0–100.0)
Platelets: 206 10*3/uL (ref 150–400)
RBC: 3.92 MIL/uL — ABNORMAL LOW (ref 4.22–5.81)
RDW: 13.3 % (ref 11.5–15.5)
WBC: 8.2 10*3/uL (ref 4.0–10.5)
nRBC: 0 % (ref 0.0–0.2)

## 2020-11-16 LAB — BASIC METABOLIC PANEL
Anion gap: 6 (ref 5–15)
BUN: 11 mg/dL (ref 6–20)
CO2: 25 mmol/L (ref 22–32)
Calcium: 8.8 mg/dL — ABNORMAL LOW (ref 8.9–10.3)
Chloride: 107 mmol/L (ref 98–111)
Creatinine, Ser: 1.09 mg/dL (ref 0.61–1.24)
GFR, Estimated: >60 mL/min (ref >60)
Glucose, Bld: 92 mg/dL (ref 70–99)
Potassium: 4.4 mmol/L (ref 3.5–5.1)
Sodium: 138 mmol/L (ref 135–145)

## 2020-11-16 MED ORDER — SODIUM CHLORIDE 0.9 % IV SOLN: INTRAVENOUS | Status: DC

## 2020-11-16 NOTE — Evaluation (Addendum)
Physical Therapy Evaluation Patient Details Name: Alexander Pope MRN: [id number] DOB: [birth year] Today's Date: 11/16/2020  History of Present Illness  Alexander Pope is a 40 y.o. male with medical history significant for alcohol abuse, depression, anxiety.  Patient presented to the ED with complaints of chest pain of 4 days.  Patient's mother is present at bedside.  Chest pain is on the right side of his chest.  Chest pain is worse with breathing, and any movement.  He denies any actual difficulty breathing, he only reports that his chest pain gets worse when he takes a deep breath making it hard for him to breathe.  Prior to the chest pain he had no difficulty breathing.  Reports he woke up in the morning over the weekend with chest pain.  He does not think he fell out of bed, he denies any other falls, no trauma to his ribs, denies any fights or blows to the area.  Smokes 1 pack of cigarettes daily.  He drinks alcohol daily, he reports 1-3 beers daily. (But on private discussion with patient's mother alone, she reports patient drinks more at least 4-6 big bottles of beer she is unsure of amount of blood probably about 30 ounces).  Patient lives with his 6 year old son.  He denies vomiting, no loose stools, reports good oral intake.     No leg swelling.  He denies history of blood clots in lungs or legs.  History of blood clots in paternal uncle.     Patient presented to the urgent care, he was referred to the ED due to O2 sats of 87% on room air, hypotension and tachycardia.   Clinical Impression  Patient functioning near baseline for functional mobility and gait. Patient independent w/ all transfers and modified independent with ambulation in room. Requires supervision w/ ambulation in hallway without AD while on room air with SpO2 averaging 90% - RN notified. Occasionally demonstrates unsteadiness on feet during ambulation w/ no AD but no loss of balance. Patient declined HHPT and states he will be  alright. Patient discharged to care of nursing for ambulation daily as tolerated for length of stay.      Recommendations for follow up therapy are one component of a multi-disciplinary discharge planning process, led by the attending physician.  Recommendations may be updated based on patient status, additional functional criteria and insurance authorization.  Follow Up Recommendations No PT follow up    Equipment Recommendations  None recommended by PT    Recommendations for Other Services       Precautions / Restrictions Precautions Precautions: Fall Precaution Comments: Patient occasionally slightly unsteady on feet Restrictions Weight Bearing Restrictions: No      Mobility  Bed Mobility Overal bed mobility: Independent               Patient Response: Cooperative  Transfers Overall transfer level: Modified independent Equipment used: None             General transfer comment: Slow labored movement  Ambulation/Gait Ambulation/Gait assistance: Modified independent (Device/Increase time);Supervision Gait Distance (Feet): 200 Feet Assistive device: None Gait Pattern/deviations: Decreased step length - right;Decreased step length - left;Decreased stride length Gait velocity: Decreased   General Gait Details: Occasionally unsteady on feet during ambulation w/ no AD use. No loss of balance. Requires supervision w/ ambulation in hallway and modified independence w/ ambulating to the restroom in his room.  Stairs            Psychologist, prison and probation services  Modified Rankin (Stroke Patients Only)       Balance Overall balance assessment: Modified Independent Sitting-balance support: No upper extremity supported;Feet supported Sitting balance-Leahy Scale: Normal     Standing balance support: No upper extremity supported;During functional activity Standing balance-Leahy Scale: Good                               Pertinent Vitals/Pain Pain  Assessment: 0-10 Pain Score: 4  Pain Location: Around R rib number 10 Pain Descriptors / Indicators: Radiating;Sore Pain Intervention(s): Monitored during session;RN gave pain meds during session    Home Living Family/patient expects to be discharged to:: Private residence Living Arrangements: Children Available Help at Discharge: Family Type of Home: Apartment Home Access: Stairs to enter Entrance Stairs-Rails: Doctor, general practice of Steps: 3 Home Layout: One level;Able to live on main level with bedroom/bathroom Home Equipment: None Additional Comments: Lives at home with his son    Prior Function Level of Independence: Independent         Comments: Patient reports that his son does the cooking and cleaning     Hand Dominance        Extremity/Trunk Assessment   Upper Extremity Assessment Upper Extremity Assessment: Overall WFL for tasks assessed    Lower Extremity Assessment Lower Extremity Assessment: Overall WFL for tasks assessed    Cervical / Trunk Assessment Cervical / Trunk Assessment: Normal  Communication   Communication: No difficulties  Cognition Arousal/Alertness: Awake/alert Behavior During Therapy: WFL for tasks assessed/performed Overall Cognitive Status: Within Functional Limits for tasks assessed                                        General Comments General comments (skin integrity, edema, etc.): Occasionally usteady on feet during ambulation w/ no AD. No loss of balance. Patient reported that he was tired today.    Exercises     Assessment/Plan    PT Assessment Patent does not need any further PT services  PT Problem List         PT Treatment Interventions      PT Goals (Current goals can be found in the Care Plan section)  Acute Rehab PT Goals Patient Stated Goal: Return home PT Goal Formulation: With patient Time For Goal Achievement: 11/16/20 Potential to Achieve Goals: Good    Frequency      Barriers to discharge        Co-evaluation               AM-PAC PT "6 Clicks" Mobility  Outcome Measure Help needed turning from your back to your side while in a flat bed without using bedrails?: None Help needed moving from lying on your back to sitting on the side of a flat bed without using bedrails?: None Help needed moving to and from a bed to a chair (including a wheelchair)?: None Help needed standing up from a chair using your arms (e.g., wheelchair or bedside chair)?: None Help needed to walk in hospital room?: A Little Help needed climbing 3-5 steps with a railing? : A Little 6 Click Score: 22    End of Session Equipment Utilized During Treatment: Gait belt Activity Tolerance: Patient tolerated treatment well;No increased pain;Patient limited by fatigue Patient left: in chair;with call bell/phone within reach Nurse Communication: Mobility status      Time: [phone]  PT Time Calculation (min) (ACUTE ONLY): 26 min   Charges:   PT Evaluation $PT Eval Moderate Complexity: 1 Mod          Cassie Jones, SPT  During this treatment session, the therapist was present, participating in and directing the treatment.  11:38 AM, 11/16/20 Ocie Bob, MPT Physical Therapist with [facility] 336 [phone] office [phone] mobile phone

## 2020-11-16 NOTE — Progress Notes (Signed)
SATURATION QUALIFICATIONS: (This note is used to comply with regulatory documentation for home oxygen)  Patient Saturations on Room Air at Rest = 94%  Patient Saturations on Room Air while Ambulating = 90%    

## 2020-11-16 NOTE — Progress Notes (Signed)
PROGRESS NOTE    MCKOY BHAKTA  AUQ:[id number] DOB: 05/10/1980 DOA: 11/14/2020 PCP: Gareth Morgan, MD    Brief Narrative:  Alexander Pope is a 40 year old male with past medical history significant for EtOH abuse, depression/anxiety who presented to [facility] ED on 10/5 with complaints of chest pain, shortness of breath.  Patient reports onset 4 days ago, chest pain worse with deep inspiration and any type of movement.  He reports he awoke with symptoms over the weekend.  Denies falling out of bed, no known falls or trauma.  Denies nausea/vomiting, no diarrhea or bloody stools.  Patient endorses 1 pack of cigarettes daily, daily alcohol use with reported 1-3 beers daily (but with discussion with patient's mother alone she reports patient drinks at least 4-6 big bottles of beer daily).   In the ED, temperature 98.5 F, HR 122, RR 25, BP 97/58, SPO2 93% on room air.  Sodium 133, potassium 3.8, chloride 99, CO2 21, BUN 27, creatinine 3.42, glucose 116.  AST 21, ALT 23, total bilirubin 1.2.  WBC 16.5, hemoglobin 14.6, platelets 198.  High sensitive troponin 3>2.  COVID-19 PCR negative.  Influenza A/B PCR negative.  Urinalysis unrevealing.  UDS positive for amphetamines, benzodiazepines, opiates, THC.  Chest x-ray with nondisplaced fracture right lateral sixth rib, trace pleural effusion and right basilar atelectasis.  EKG with normal sinus rhythm, rate 122, nonspecific T wave changes.  EDP consulted [facility] for further evaluation and management of acute renal failure, rib fracture.   Assessment & Plan:   Principal Problem:   AKI (acute kidney injury) (HCC) Active Problems:   Alcohol dependence (HCC)   Chest pain   HTN (hypertension)   Tobacco abuse   Acute renal failure (ARF) (HCC)   Acute renal failure: Improving Patient presenting to ED with chest pain, was found to have an elevated creatinine of 3.42.  Continues with significant EtOH abuse, likely secondary to prerenal azotemia in  the setting of dehydration. --Cr 3.42>1.66>1.09 --Continue IV fluid hydration with NS at 13mL/hr --Avoid nephrotoxins, renally dose all medications --Repeat renal function in a.m.  EtOH withdrawal, acute Patient continues to endorse daily alcohol use with beer, although reports much less than mother reports.  Denies any history of withdrawal seizures or significant DTs. EtOH level less than 10 on arrival.  Patient has developed significant diaphoresis with mild tremors likely secondary to active withdrawal symptoms. --Counseled patient on need for complete cessation --CIWA protocol with symptom triggered ativan; received 4 mg IV Ativan in past 24 hours --Thiamine, multivitamin, folic acid --Continue monitor on telemetry  Acute respiratory failure, POA: Resolved On arrival to the ED, SPO2 90% on room air; and placed on 3 L nasal cannula chest x-ray with right lateral sixth rib fracture with right basilar atelectasis; likely secondary to splinting from rib fracture.  Seen by physical therapy today, on ambulation desatted to 90%, oxygen now weaned off. --Continue SPO2 checks with vital signs  Rib fracture with chest pain Patient reports right-sided chest pain.  High sensitive troponin negative.  EKG with sinus tachycardia, nonspecific T wave changes.  Denies any known history of trauma to the area, but likely poor historian given his continued alcohol and substance abuse.  Suspect alcohol intoxication with fall as etiology.  Chest x-ray with nondisplaced fracture right lateral sixth rib. --Norco 5-325 mg p.o. every 4 hours as needed moderate pain --Morphine 2 mg IV every 4 hours as needed severe pain --Lidocaine patch --Incentive spirometry  Leukocytosis, likely reactive: Resolved WBC  16.5 admission, chest x-ray without focal consolidation, urinalysis unrevealing.  No focus of infection at this time.  Etiology likely secondary to concentration/reactive in the setting of poor oral intake,  dehydration and continued alcohol abuse. --WBC 16.5>11.0>8.2 --Continue IV fluid hydration  Hyponatremia: Resolved Sodium 133 on admission, likely secondary to beer potomania from significant EtOH abuse.  Counseled on need for complete cessation. --Na [id number] --continue IVF hydration --BMP daily  Polysubstance abuse UDS positive for amphetamines, benzodiazepines, opiates, and THC.  EtOH level less than 10 on arrival.   --Counseled patient on need for complete cessation  Tobacco use disorder Continues to endorse 1 pack of cigarettes daily.  Counseled for need for complete cessation. --Nicotine patch  Depression/anxiety Patient on monthly Invega injections. --Seroquel 50 mg p.o. nightly --Xanax 1 mg p.o. nightly --Cymbalta   DVT prophylaxis: enoxaparin (LOVENOX) injection 30 mg Start: 11/14/20 2000   Code Status: Full Code Family Communication: No family present at bedside this morning, updated patient's mother who was present at bedside yesterday afternoon.  Disposition Plan:  Level of care: Telemetry Status is: Observation  The patient will require care spanning > 2 midnights and should be moved to inpatient because: Persistent severe electrolyte disturbances, Ongoing active pain requiring inpatient pain management, Ongoing diagnostic testing needed not appropriate for outpatient work up, Unsafe d/c plan, IV treatments appropriate due to intensity of illness or inability to take PO, and Inpatient level of care appropriate due to severity of illness  Dispo: The patient is from: Home              Anticipated d/c is to: Home              Patient currently is not medically stable to d/c.   Difficult to place patient No   Consultants:  None  Procedures:  None  Antimicrobials:  None   Subjective: Patient seen examined at bedside, resting comfortably in bed.  Sleeping but arousable.  No family present this morning.  Patient continues with diaphoresis, mild tremors;  actively withdrawing from alcohol.  Received 4 mg IV Ativan the past 24 hours.  Has been weaned off of supplemental oxygen this morning after PT evaluation with no significant desaturation on ambulation.  Renal function approaching baseline, continues on IV fluids.  Hyponatremia resolved.  Patient with no complaints this morning. Denies headache, no dizziness, no current chest pain, no abdominal pain, no fever/chills/night sweats, no nausea/vomiting/diarrhea.  Objective: Vitals:   11/16/20 0445 11/16/20 0835 11/16/20 0946 11/16/20 1018  BP: (!) 154/116     Pulse: 97 90    Resp: (!) 22     Temp: 98.4 F (36.9 C)     TempSrc: Oral     SpO2: 99% 92% 93% 90%    Intake/Output Summary (Last 24 hours) at 11/16/2020 1235 Last data filed at 11/16/2020 0940 Gross per 24 hour  Intake 2756.45 ml  Output --  Net 2756.45 ml   There were no vitals filed for this visit.  Examination:  General exam: Appears calm and comfortable, diaphoretic, anxious, appears older than stated age, disheveled in appearance Respiratory system: Clear to auscultation but poor inspiratory effort due to pain, no accessory muscle use, O2 weaned to room air this morning. Cardiovascular system: S1 & S2 heard, RRR. No JVD, murmurs, rubs, gallops or clicks. No pedal edema. Gastrointestinal system: Abdomen is nondistended, soft and nontender. No organomegaly or masses felt. Normal bowel sounds heard. Central nervous system: Alert and oriented. No focal neurological deficits. Extremities: Symmetric  5 x 5 power. Skin: No rashes, lesions or ulcers Psychiatry: Judgement and insight appear poor.  Anxious mood.    Data Reviewed: I have personally reviewed following labs and imaging studies  CBC: Recent Labs  Lab 11/14/20 1250 11/15/20 0634 11/16/20 0551  WBC 16.5* 11.0* 8.2  NEUTROABS 13.5*  --   --   HGB 14.6 13.2 12.1*  HCT 43.6 39.7 37.7*  MCV 93.2 95.0 96.2  PLT 198 191 206   Basic Metabolic Panel: Recent Labs   Lab 11/14/20 1250 11/14/20 1600 11/15/20 0634 11/16/20 0551  NA 133*  --  134* 138  K 3.8  --  4.4 4.4  CL 99  --  103 107  CO2 21*  --  24 25  GLUCOSE 116*  --  109* 92  BUN 27*  --  19 11  CREATININE 3.42*  --  1.66* 1.09  CALCIUM 8.7*  --  8.5* 8.8*  MG  --  2.1  --   --   PHOS  --  5.1*  --   --    GFR: CrCl cannot be calculated (Unknown ideal weight.). Liver Function Tests: Recent Labs  Lab 11/14/20 1250  AST 21  ALT 23  ALKPHOS 49  BILITOT 1.2  PROT 7.5  ALBUMIN 4.0   No results for input(s): LIPASE, AMYLASE in the last 168 hours. No results for input(s): AMMONIA in the last 168 hours. Coagulation Profile: No results for input(s): INR, PROTIME in the last 168 hours. Cardiac Enzymes: Recent Labs  Lab 11/14/20 1600  CKTOTAL 493*   BNP (last 3 results) No results for input(s): PROBNP in the last 8760 hours. HbA1C: No results for input(s): HGBA1C in the last 72 hours. CBG: No results for input(s): GLUCAP in the last 168 hours. Lipid Profile: No results for input(s): CHOL, HDL, LDLCALC, TRIG, CHOLHDL, LDLDIRECT in the last 72 hours. Thyroid Function Tests: No results for input(s): TSH, T4TOTAL, FREET4, T3FREE, THYROIDAB in the last 72 hours. Anemia Panel: No results for input(s): VITAMINB12, FOLATE, FERRITIN, TIBC, IRON, RETICCTPCT in the last 72 hours. Sepsis Labs: No results for input(s): PROCALCITON, LATICACIDVEN in the last 168 hours.  Recent Results (from the past 240 hour(s))  Resp Panel by RT-PCR (Flu A&B, Covid) Nasopharyngeal Swab     Status: None   Collection Time: 11/14/20 12:41 PM   Specimen: Nasopharyngeal Swab; Nasopharyngeal(NP) swabs in vial transport medium  Result Value Ref Range Status   SARS Coronavirus 2 by RT PCR NEGATIVE NEGATIVE Final    Comment: (NOTE) SARS-CoV-2 target nucleic acids are NOT DETECTED.  The SARS-CoV-2 RNA is generally detectable in upper respiratory specimens during the acute phase of infection. The  lowest concentration of SARS-CoV-2 viral copies this assay can detect is 138 copies/mL. A negative result does not preclude SARS-Cov-2 infection and should not be used as the sole basis for treatment or other patient management decisions. A negative result may occur with  improper specimen collection/handling, submission of specimen other than nasopharyngeal swab, presence of viral mutation(s) within the areas targeted by this assay, and inadequate number of viral copies(<138 copies/mL). A negative result must be combined with clinical observations, patient history, and epidemiological information. The expected result is Negative.  Fact Sheet for Patients:  BloggerCourse.com  Fact Sheet for Healthcare Providers:  SeriousBroker.it  This test is no t yet approved or cleared by the [place] FDA and  has been authorized for detection and/or diagnosis of SARS-CoV-2 by FDA under an Emergency Use Authorization (  EUA). This EUA will remain  in effect (meaning this test can be used) for the duration of the COVID-19 declaration under Section 564(b)(1) of the Act, 21 U.S.C.section 360bbb-3(b)(1), unless the authorization is terminated  or revoked sooner.       Influenza A by PCR NEGATIVE NEGATIVE Final   Influenza B by PCR NEGATIVE NEGATIVE Final    Comment: (NOTE) The Xpert Xpress SARS-CoV-2/FLU/RSV plus assay is intended as an aid in the diagnosis of influenza from Nasopharyngeal swab specimens and should not be used as a sole basis for treatment. Nasal washings and aspirates are unacceptable for Xpert Xpress SARS-CoV-2/FLU/RSV testing.  Fact Sheet for Patients: BloggerCourse.com  Fact Sheet for Healthcare Providers: SeriousBroker.it  This test is not yet approved or cleared by the [place] FDA and has been authorized for detection and/or diagnosis of SARS-CoV-2 by FDA under  an Emergency Use Authorization (EUA). This EUA will remain in effect (meaning this test can be used) for the duration of the COVID-19 declaration under Section 564(b)(1) of the Act, 21 U.S.C. section 360bbb-3(b)(1), unless the authorization is terminated or revoked.  Performed at [facility], [address], [place], [place] [address]          Radiology Studies: DG Chest Portable 1 View  Result Date: 11/14/2020 CLINICAL DATA:  Shortness of breath EXAM: PORTABLE CHEST 1 VIEW COMPARISON:  09/28/2019 FINDINGS: Right basilar linear airspace disease likely reflecting atelectasis. Trace right pleural effusion. No pneumothorax. Stable cardiomediastinal silhouette. Nondisplaced fracture of the right sixth lateral rib. IMPRESSION: 1. Nondisplaced fracture of the right lateral sixth rib. Trace right pleural effusion and right basilar atelectasis. Electronically Signed   By: Elige Ko M.D.   On: 11/14/2020 13:07        Scheduled Meds:  ALPRAZolam  1 mg Oral QHS   amphetamine-dextroamphetamine  30 mg Oral BID   enoxaparin (LOVENOX) injection  30 mg Subcutaneous Q24H   folic acid  1 mg Oral Daily   lidocaine  1 patch Transdermal Q24H   multivitamin with minerals  1 tablet Oral Daily   nicotine  14 mg Transdermal Daily   QUEtiapine  50 mg Oral QHS   thiamine  100 mg Oral Daily   Or   thiamine  100 mg Intravenous Daily   Continuous Infusions:  0.9 % NaCl with KCl 20 mEq / L 100 mL/hr at 11/16/20 1033     LOS: 1 day    Time spent: 39 minutes spent on chart review, discussion with nursing staff, consultants, updating family and interview/physical exam; more than 50% of that time was spent in counseling and/or coordination of care.    Alvira Philips [place], DO [facility] Available via Epic secure chat 7am-7pm After these hours, please refer to coverage provider listed on [email or web address] 11/16/2020, 12:35 PM

## 2020-11-17 LAB — BASIC METABOLIC PANEL
Anion gap: 7 (ref 5–15)
BUN: 9 mg/dL (ref 6–20)
CO2: 27 mmol/L (ref 22–32)
Calcium: 8.4 mg/dL — ABNORMAL LOW (ref 8.9–10.3)
Chloride: 102 mmol/L (ref 98–111)
Creatinine, Ser: 1.09 mg/dL (ref 0.61–1.24)
GFR, Estimated: >60 mL/min (ref >60)
Glucose, Bld: 103 mg/dL — ABNORMAL HIGH (ref 70–99)
Potassium: 3.9 mmol/L (ref 3.5–5.1)
Sodium: 136 mmol/L (ref 135–145)

## 2020-11-17 NOTE — Discharge Summary (Signed)
Physician Discharge Summary  Alexander Pope:[id number] DOB: 02-22-1980 DOA: 11/14/2020  PCP: Gareth Morgan, MD  Admit date: 11/14/2020 Discharge date: 11/17/2020  Admitted From: Home Disposition:  Home  Recommendations for Outpatient Follow-up:  Follow up with PCP in 1-2 weeks  Discharge Condition:Improved CODE STATUS:Full Diet recommendation: Heart healthy   Brief/Interim Summary: 40 year old male with past medical history significant for EtOH abuse, depression/anxiety who presented to [facility] ED on 10/5 with complaints of chest pain, shortness of breath.  Patient reports onset 4 days ago, chest pain worse with deep inspiration and any type of movement.  He reports he awoke with symptoms over the weekend.  Denies falling out of bed, no known falls or trauma.  Denies nausea/vomiting, no diarrhea or bloody stools.  Discharge Diagnoses:  Principal Problem:   AKI (acute kidney injury) (HCC) Active Problems:   Alcohol dependence (HCC)   Chest pain   HTN (hypertension)   Tobacco abuse   Acute renal failure (ARF) (HCC)   Acute renal failure: Improving Patient presenting to ED with chest pain, was found to have an elevated creatinine of 3.42.  Continues with significant EtOH abuse, likely secondary to prerenal azotemia in the setting of dehydration. --Cr 3.42>1.66>1.09 --Improved with IVF hydration   EtOH withdrawal, acute Patient continues to endorse daily alcohol use with beer, although reports much less than mother reports.  Denies any history of withdrawal seizures or significant DTs. EtOH level less than 10 on arrival.  Patient has developed significant diaphoresis with mild tremors likely secondary to active withdrawal symptoms. --Counseled patient on need for complete cessation   Acute respiratory failure, POA: Resolved On arrival to the ED, SPO2 90% on room air; and placed on 3 L nasal cannula chest x-ray with right lateral sixth rib fracture with right basilar  atelectasis; likely secondary to splinting from rib fracture.  Seen by physical therapy today, on ambulation desatted to 90%, oxygen now weaned off.   Rib fracture with chest pain Patient reports right-sided chest pain.  High sensitive troponin negative.  EKG with sinus tachycardia, nonspecific T wave changes.  Denies any known history of trauma to the area, but likely poor historian given his continued alcohol and substance abuse.  Suspect alcohol intoxication with fall as etiology.  Chest x-ray with nondisplaced fracture right lateral sixth rib. --Norco 5-325 mg p.o. every 4 hours as needed moderate pain --Morphine 2 mg IV every 4 hours as needed severe pain --Remained stable   Leukocytosis, likely reactive: Resolved WBC 16.5 admission, chest x-ray without focal consolidation, urinalysis unrevealing.  No focus of infection at this time.  Etiology likely secondary to concentration/reactive in the setting of poor oral intake, dehydration and continued alcohol abuse. --WBC 16.5>11.0>8.2 -Remained hemodynamically stable   Hyponatremia: Resolved Sodium 133 on admission, likely secondary to beer potomania from significant EtOH abuse.  Counseled on need for complete cessation. --Na [id number] --continue IVF hydration --BMP daily   Polysubstance abuse UDS positive for amphetamines, benzodiazepines, opiates, and THC.  EtOH level less than 10 on arrival.   --Counseled patient on need for complete cessation   Tobacco use disorder Continues to endorse 1 pack of cigarettes daily.  Counseled for need for complete cessation. --Nicotine patch this visit   Depression/anxiety Patient on monthly Invega injections. --Seroquel 50 mg p.o. nightly --Xanax 1 mg p.o. nightly while in hospital --Cymbalta   Discharge Instructions   Allergies as of 11/17/2020   No Known Allergies      Medication List  STOP taking these medications    doxycycline 100 MG capsule Commonly known as: VIBRAMYCIN    DULoxetine 60 MG capsule Commonly known as: CYMBALTA   oxyCODONE-acetaminophen 10-325 MG tablet Commonly known as: PERCOCET       TAKE these medications    ALPRAZolam 1 MG tablet Commonly known as: XANAX Take 1 mg by mouth at bedtime as needed for anxiety.   amphetamine-dextroamphetamine 30 MG tablet Commonly known as: ADDERALL Take 30 mg by mouth 2 (two) times daily.   gabapentin 300 MG capsule Commonly known as: NEURONTIN Take 1 capsule (300 mg total) by mouth 3 (three) times daily.   Hinda Glatter Sustenna 234 MG/1.5ML Susy injection Generic drug: paliperidone Inject 234 mg into the muscle every 30 (thirty) days.   lisinopril-hydrochlorothiazide 20-12.5 MG tablet Commonly known as: Zestoretic Take 1 tablet by mouth daily.   metoprolol tartrate 50 MG tablet Commonly known as: LOPRESSOR Take 1 tablet (50 mg total) by mouth 2 (two) times daily.   QUEtiapine 50 MG tablet Commonly known as: SEROQUEL Take 1-2 tablets by mouth at bedtime.        Follow-up Information     Gareth Morgan, MD Follow up in 2 day(s).   Specialty: Family Medicine Why: Hospital follow up Contact information: [address] [place] [address] [phone]                No Known Allergies   Procedures/Studies: DG Chest Portable 1 View  Result Date: 11/14/2020 CLINICAL DATA:  Shortness of breath EXAM: PORTABLE CHEST 1 VIEW COMPARISON:  09/28/2019 FINDINGS: Right basilar linear airspace disease likely reflecting atelectasis. Trace right pleural effusion. No pneumothorax. Stable cardiomediastinal silhouette. Nondisplaced fracture of the right sixth lateral rib. IMPRESSION: 1. Nondisplaced fracture of the right lateral sixth rib. Trace right pleural effusion and right basilar atelectasis. Electronically Signed   By: Elige Ko M.D.   On: 11/14/2020 13:07    Subjective: Eager to go home  Discharge Exam: Vitals:   11/16/20 2102 11/17/20 0528  BP: 111/66 97/84  Pulse: 97 88   Resp: 20 20  Temp: 99.9 F (37.7 C) 99.6 F (37.6 C)  SpO2: 90% 93%   Vitals:   11/16/20 1018 11/16/20 1330 11/16/20 2102 11/17/20 0528  BP:  120/86 111/66 97/84  Pulse:  98 97 88  Resp:  20 20 20   Temp:  99.6 F (37.6 C) 99.9 F (37.7 C) 99.6 F (37.6 C)  TempSrc:  Oral Oral Oral  SpO2: 90% 95% 90% 93%    General: Pt is alert, awake, not in acute distress Cardiovascular: RRR, S1/S2 + Respiratory: CTA bilaterally, no wheezing, no rhonchi Abdominal: Soft, NT, ND, bowel sounds + Extremities: no edema, no cyanosis   The results of significant diagnostics from this hospitalization (including imaging, microbiology, ancillary and laboratory) are listed below for reference.     Microbiology: Recent Results (from the past 240 hour(s))  Resp Panel by RT-PCR (Flu A&B, Covid) Nasopharyngeal Swab     Status: None   Collection Time: 11/14/20 12:41 PM   Specimen: Nasopharyngeal Swab; Nasopharyngeal(NP) swabs in vial transport medium  Result Value Ref Range Status   SARS Coronavirus 2 by RT PCR NEGATIVE NEGATIVE Final    Comment: (NOTE) SARS-CoV-2 target nucleic acids are NOT DETECTED.  The SARS-CoV-2 RNA is generally detectable in upper respiratory specimens during the acute phase of infection. The lowest concentration of SARS-CoV-2 viral copies this assay can detect is 138 copies/mL. A negative result does not preclude SARS-Cov-2 infection  and should not be used as the sole basis for treatment or other patient management decisions. A negative result may occur with  improper specimen collection/handling, submission of specimen other than nasopharyngeal swab, presence of viral mutation(s) within the areas targeted by this assay, and inadequate number of viral copies(<138 copies/mL). A negative result must be combined with clinical observations, patient history, and epidemiological information. The expected result is Negative.  Fact Sheet for Patients:   BloggerCourse.com  Fact Sheet for Healthcare Providers:  SeriousBroker.it  This test is no t yet approved or cleared by the [place] FDA and  has been authorized for detection and/or diagnosis of SARS-CoV-2 by FDA under an Emergency Use Authorization (EUA). This EUA will remain  in effect (meaning this test can be used) for the duration of the COVID-19 declaration under Section 564(b)(1) of the Act, 21 U.S.C.section 360bbb-3(b)(1), unless the authorization is terminated  or revoked sooner.       Influenza A by PCR NEGATIVE NEGATIVE Final   Influenza B by PCR NEGATIVE NEGATIVE Final    Comment: (NOTE) The Xpert Xpress SARS-CoV-2/FLU/RSV plus assay is intended as an aid in the diagnosis of influenza from Nasopharyngeal swab specimens and should not be used as a sole basis for treatment. Nasal washings and aspirates are unacceptable for Xpert Xpress SARS-CoV-2/FLU/RSV testing.  Fact Sheet for Patients: BloggerCourse.com  Fact Sheet for Healthcare Providers: SeriousBroker.it  This test is not yet approved or cleared by the [place] FDA and has been authorized for detection and/or diagnosis of SARS-CoV-2 by FDA under an Emergency Use Authorization (EUA). This EUA will remain in effect (meaning this test can be used) for the duration of the COVID-19 declaration under Section 564(b)(1) of the Act, 21 U.S.C. section 360bbb-3(b)(1), unless the authorization is terminated or revoked.  Performed at [facility], [address] [place], [place] [address]      Labs: BNP (last 3 results) No results for input(s): BNP in the last 8760 hours. Basic Metabolic Panel: Recent Labs  Lab 11/14/20 1250 11/14/20 1600 11/15/20 0634 11/16/20 0551 11/17/20 0455  NA 133*  --  134* 138 136  K 3.8  --  4.4 4.4 3.9  CL 99  --  103 107 102  CO2 21*  --  24 25 27   GLUCOSE 116*  --   109* 92 103*  BUN 27*  --  19 11 9   CREATININE 3.42*  --  1.66* 1.09 1.09  CALCIUM 8.7*  --  8.5* 8.8* 8.4*  MG  --  2.1  --   --   --   PHOS  --  5.1*  --   --   --    Liver Function Tests: Recent Labs  Lab 11/14/20 1250  AST 21  ALT 23  ALKPHOS 49  BILITOT 1.2  PROT 7.5  ALBUMIN 4.0   No results for input(s): LIPASE, AMYLASE in the last 168 hours. No results for input(s): AMMONIA in the last 168 hours. CBC: Recent Labs  Lab 11/14/20 1250 11/15/20 0634 11/16/20 0551  WBC 16.5* 11.0* 8.2  NEUTROABS 13.5*  --   --   HGB 14.6 13.2 12.1*  HCT 43.6 39.7 37.7*  MCV 93.2 95.0 96.2  PLT 198 191 206   Cardiac Enzymes: Recent Labs  Lab 11/14/20 1600  CKTOTAL 493*   BNP: Invalid input(s): POCBNP CBG: No results for input(s): GLUCAP in the last 168 hours. D-Dimer No results for input(s): DDIMER in the last 72 hours. Hgb A1c  No results for input(s): HGBA1C in the last 72 hours. Lipid Profile No results for input(s): CHOL, HDL, LDLCALC, TRIG, CHOLHDL, LDLDIRECT in the last 72 hours. Thyroid function studies No results for input(s): TSH, T4TOTAL, T3FREE, THYROIDAB in the last 72 hours.  Invalid input(s): FREET3 Anemia work up No results for input(s): VITAMINB12, FOLATE, FERRITIN, TIBC, IRON, RETICCTPCT in the last 72 hours. Urinalysis    Component Value Date/Time   COLORURINE YELLOW 11/14/2020 1553   APPEARANCEUR HAZY (A) 11/14/2020 1553   LABSPEC 1.020 11/14/2020 1553   PHURINE 5.0 11/14/2020 1553   GLUCOSEU NEGATIVE 11/14/2020 1553   HGBUR NEGATIVE 11/14/2020 1553   BILIRUBINUR NEGATIVE 11/14/2020 1553   KETONESUR NEGATIVE 11/14/2020 1553   PROTEINUR 30 (A) 11/14/2020 1553   NITRITE NEGATIVE 11/14/2020 1553   LEUKOCYTESUR NEGATIVE 11/14/2020 1553   Sepsis Labs Invalid input(s): PROCALCITONIN,  WBC,  LACTICIDVEN Microbiology Recent Results (from the past 240 hour(s))  Resp Panel by RT-PCR (Flu A&B, Covid) Nasopharyngeal Swab     Status: None   Collection  Time: 11/14/20 12:41 PM   Specimen: Nasopharyngeal Swab; Nasopharyngeal(NP) swabs in vial transport medium  Result Value Ref Range Status   SARS Coronavirus 2 by RT PCR NEGATIVE NEGATIVE Final    Comment: (NOTE) SARS-CoV-2 target nucleic acids are NOT DETECTED.  The SARS-CoV-2 RNA is generally detectable in upper respiratory specimens during the acute phase of infection. The lowest concentration of SARS-CoV-2 viral copies this assay can detect is 138 copies/mL. A negative result does not preclude SARS-Cov-2 infection and should not be used as the sole basis for treatment or other patient management decisions. A negative result may occur with  improper specimen collection/handling, submission of specimen other than nasopharyngeal swab, presence of viral mutation(s) within the areas targeted by this assay, and inadequate number of viral copies(<138 copies/mL). A negative result must be combined with clinical observations, patient history, and epidemiological information. The expected result is Negative.  Fact Sheet for Patients:  BloggerCourse.com  Fact Sheet for Healthcare Providers:  SeriousBroker.it  This test is no t yet approved or cleared by the [place] FDA and  has been authorized for detection and/or diagnosis of SARS-CoV-2 by FDA under an Emergency Use Authorization (EUA). This EUA will remain  in effect (meaning this test can be used) for the duration of the COVID-19 declaration under Section 564(b)(1) of the Act, 21 U.S.C.section 360bbb-3(b)(1), unless the authorization is terminated  or revoked sooner.       Influenza A by PCR NEGATIVE NEGATIVE Final   Influenza B by PCR NEGATIVE NEGATIVE Final    Comment: (NOTE) The Xpert Xpress SARS-CoV-2/FLU/RSV plus assay is intended as an aid in the diagnosis of influenza from Nasopharyngeal swab specimens and should not be used as a sole basis for treatment. Nasal washings  and aspirates are unacceptable for Xpert Xpress SARS-CoV-2/FLU/RSV testing.  Fact Sheet for Patients: BloggerCourse.com  Fact Sheet for Healthcare Providers: SeriousBroker.it  This test is not yet approved or cleared by the [place] FDA and has been authorized for detection and/or diagnosis of SARS-CoV-2 by FDA under an Emergency Use Authorization (EUA). This EUA will remain in effect (meaning this test can be used) for the duration of the COVID-19 declaration under Section 564(b)(1) of the Act, 21 U.S.C. section 360bbb-3(b)(1), unless the authorization is terminated or revoked.  Performed at [facility], [address] [place], [place] [address]    Time spent:  SIGNED:   Rickey Barbara, MD  [facility] 11/17/2020, 10:21  AM  If 7PM-7AM, please contact night-coverage

## 2021-04-07 IMAGING — US US EXTREM LOW VENOUS*L*
1 series · 14 of 24 positions shown · non-contrast
Comparison: None.

CLINICAL DATA: 38-year-old male with left lower extremity pain and
redness.

EXAM:
Left LOWER EXTREMITY VENOUS DOPPLER ULTRASOUND
TECHNIQUE: Gray-scale sonography with compression, as well as color and duplex
ultrasound, were performed to evaluate the deep venous system(s)
from the level of the common femoral vein through the popliteal and
proximal calf veins.

[Series 1: us venous img lower uni left (dvt) · portal-venous · 14 of 34 slices shown]
[im 1/34]
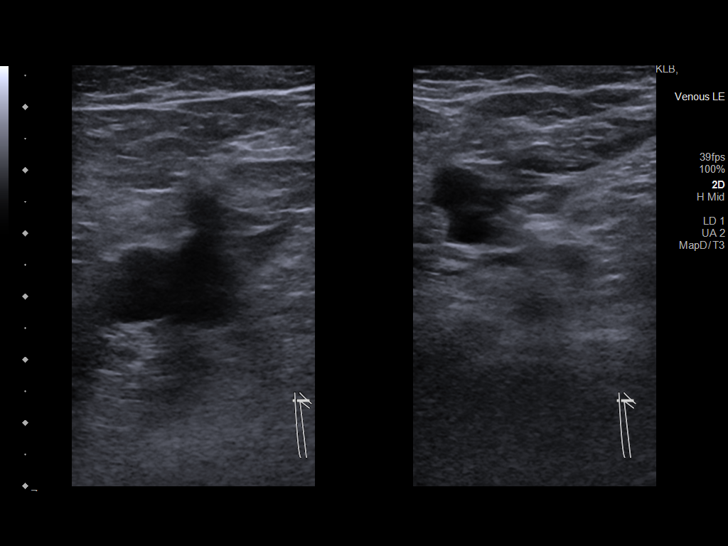
[im 3/34]
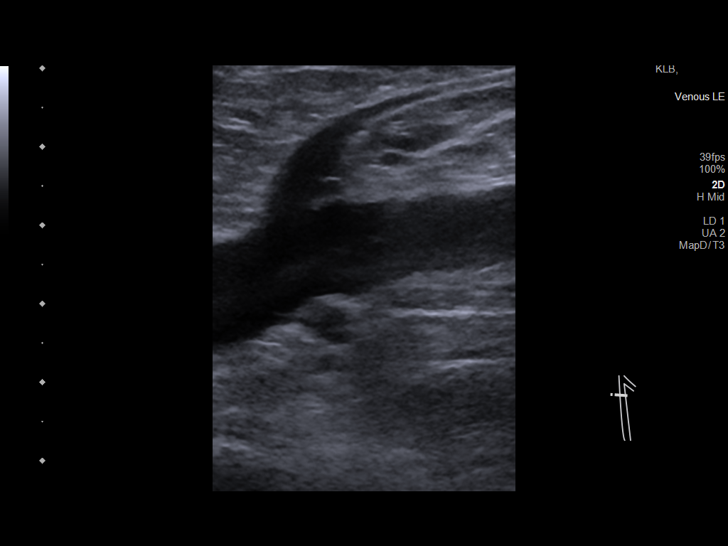
[im 6/34]
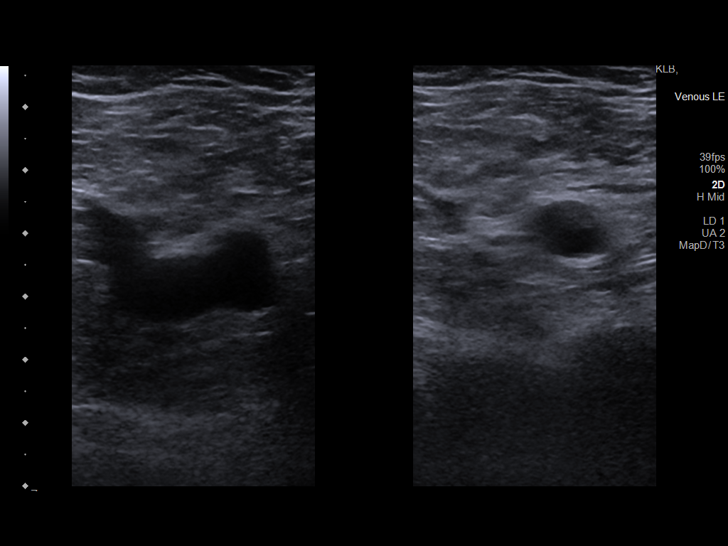
[im 9/34]
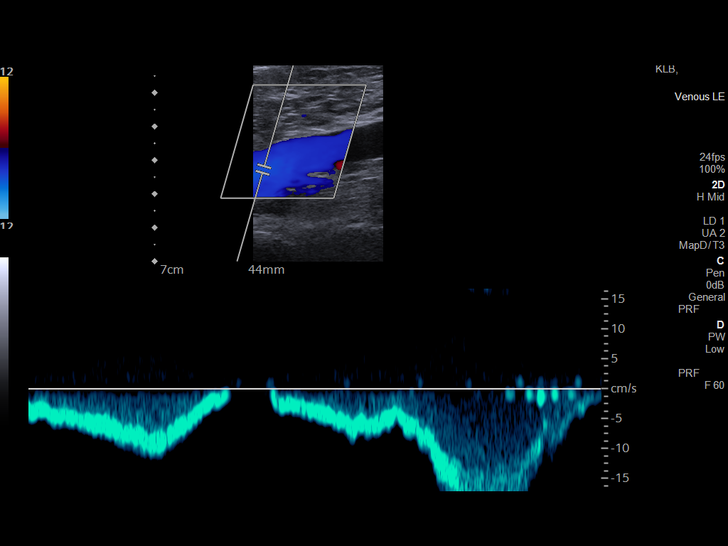
[im 11/34]
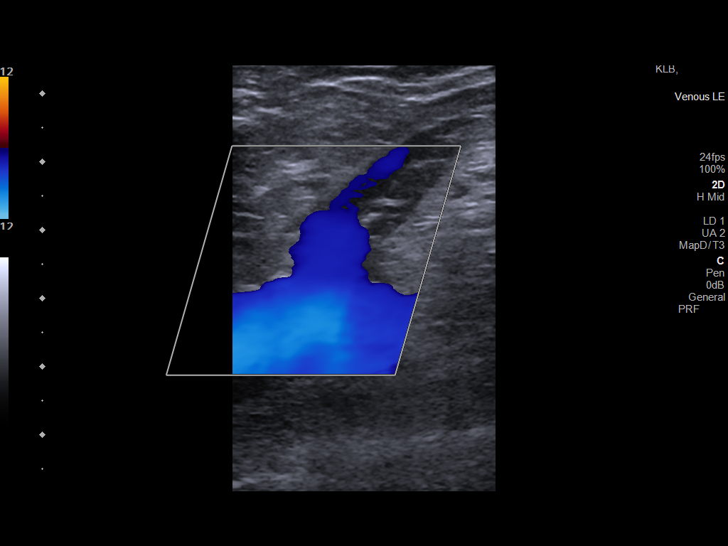
[im 13/34]
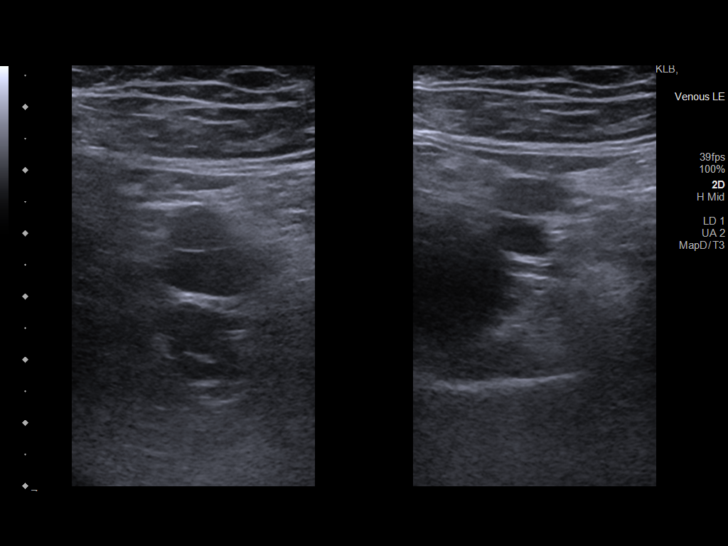
[im 16/34]
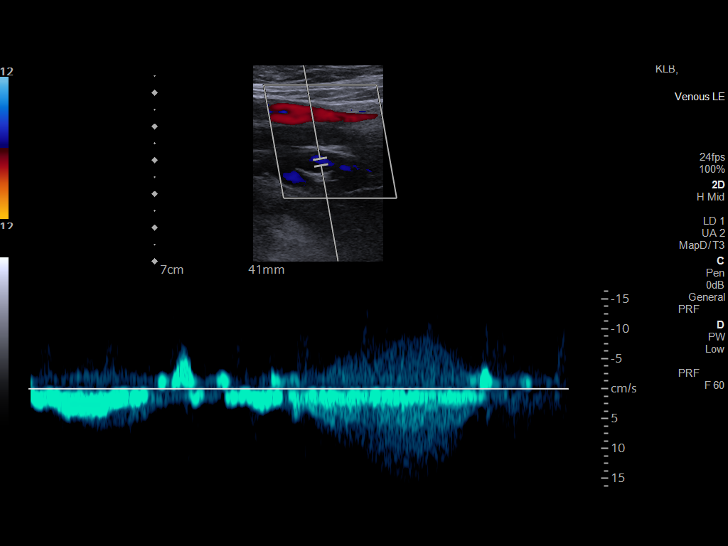
[im 18/34]
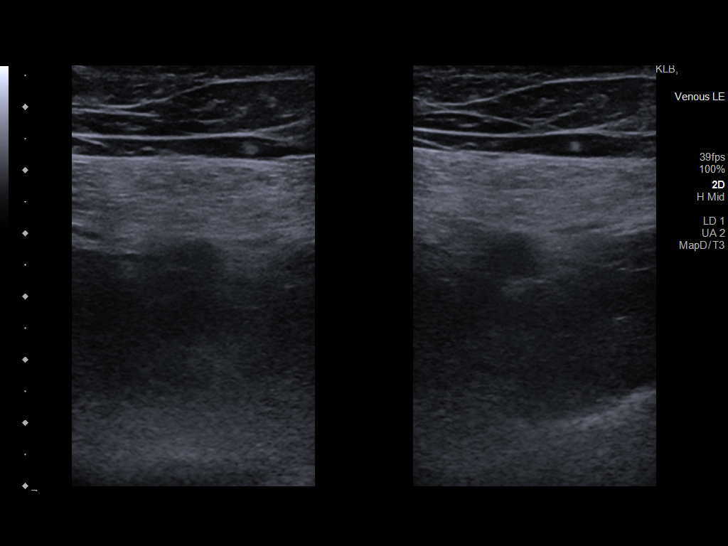
[im 21/34]
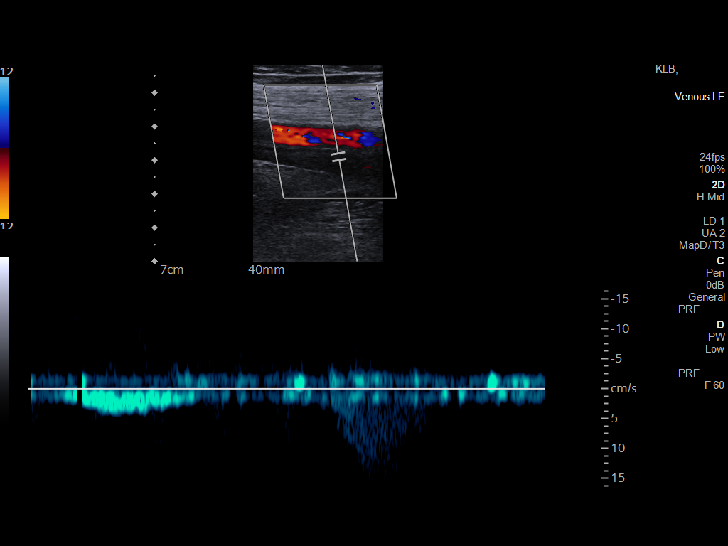
[im 23/34]
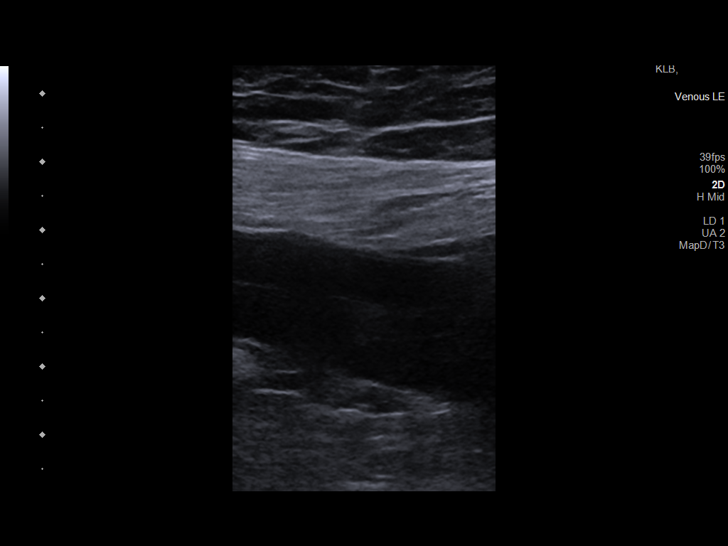
[im 26/34]
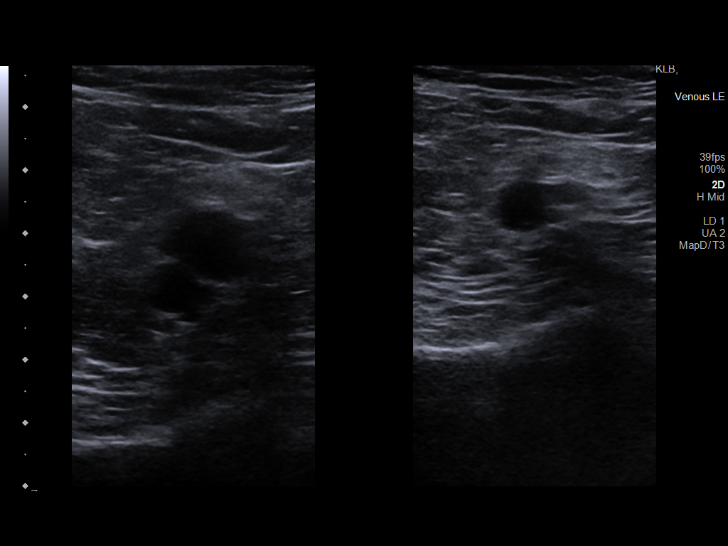
[im 28/34]
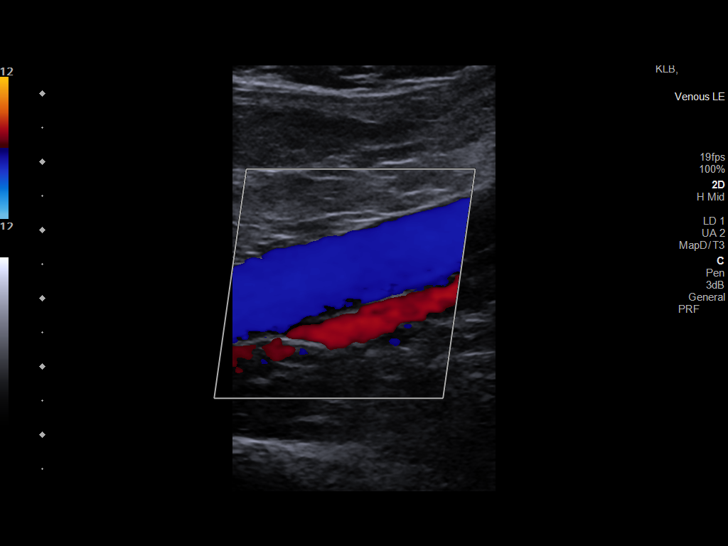
[im 31/34]
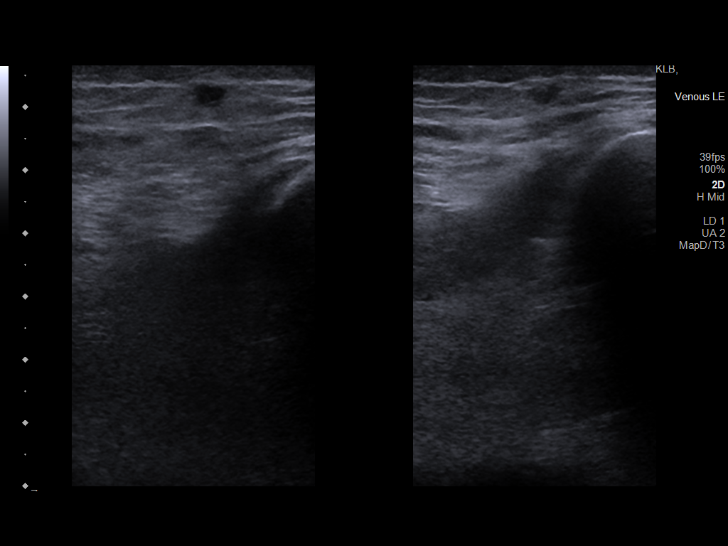
[im 34/34]
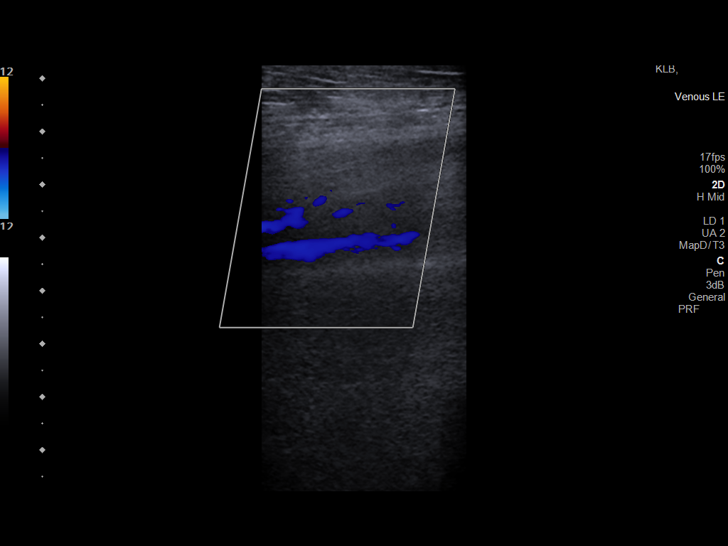

[14 of 24 positions shown; findings below may reference images not displayed]

FINDINGS: VENOUS

Normal compressibility of the common femoral, superficial femoral,
and popliteal veins, as well as the visualized calf veins.
Visualized portions of profunda femoral vein and great saphenous
vein unremarkable. No filling defects to suggest DVT on grayscale or
color Doppler imaging. Doppler waveforms show normal direction of
venous flow, normal respiratory plasticity and response to
augmentation.

Limited views of the contralateral common femoral vein are
unremarkable.

OTHER

None.

Limitations: none
IMPRESSION: Negative.

## 2021-10-09 DIAGNOSIS — X501XXA Overexertion from prolonged static or awkward postures, initial encounter: Secondary | ICD-10-CM | POA: Insufficient documentation

## 2021-10-09 DIAGNOSIS — S2242XA Multiple fractures of ribs, left side, initial encounter for closed fracture: Secondary | ICD-10-CM | POA: Insufficient documentation

## 2021-10-10 ENCOUNTER — Encounter (HOSPITAL_COMMUNITY): Payer: Self-pay

## 2021-10-10 ENCOUNTER — Emergency Department (HOSPITAL_COMMUNITY): Payer: Self-pay

## 2021-10-10 ENCOUNTER — Other Ambulatory Visit: Payer: Self-pay

## 2021-10-10 ENCOUNTER — Emergency Department (HOSPITAL_COMMUNITY)
Admission: EM | Admit: 2021-10-10 | Discharge: 2021-10-10 | Disposition: A | Discharge status: Home / Self Care | Payer: Self-pay | Attending: Emergency Medicine | Admitting: Emergency Medicine

## 2021-10-10 DIAGNOSIS — S2242XA Multiple fractures of ribs, left side, initial encounter for closed fracture: Secondary | ICD-10-CM

## 2021-10-10 LAB — I-STAT CHEM 8, ED
BUN: 14 mg/dL (ref 6–20)
Calcium, Ion: 1.18 mmol/L (ref 1.15–1.40)
Chloride: 105 mmol/L (ref 98–111)
Creatinine, Ser: 1 mg/dL (ref 0.61–1.24)
Glucose, Bld: 109 mg/dL — ABNORMAL HIGH (ref 70–99)
HCT: 43 % (ref 39.0–52.0)
Hemoglobin: 14.6 g/dL (ref 13.0–17.0)
Potassium: 3.7 mmol/L (ref 3.5–5.1)
Sodium: 140 mmol/L (ref 135–145)
TCO2: 22 mmol/L (ref 22–32)

## 2021-10-10 MED ORDER — OXYCODONE-ACETAMINOPHEN 5-325 MG PO TABS: 1.0000 | ORAL_TABLET | Freq: Four times a day (QID) | ORAL | 0 refills | Status: DC | PRN

## 2021-10-10 MED ORDER — MELOXICAM 15 MG PO TABS: 15.0000 mg | ORAL_TABLET | Freq: Every day | ORAL | 0 refills | Status: AC

## 2021-10-10 MED ORDER — KETOROLAC TROMETHAMINE 60 MG/2ML IM SOLN
30.0000 mg | Freq: Once | INTRAMUSCULAR | Status: AC
  Administered 2021-10-10: 30 mg via INTRAMUSCULAR
  Filled 2021-10-10: qty 2

## 2021-10-10 MED ORDER — OXYCODONE-ACETAMINOPHEN 5-325 MG PO TABS: 2.0000 | ORAL_TABLET | Freq: Three times a day (TID) | ORAL | 0 refills | Status: DC | PRN

## 2021-10-10 MED ORDER — OXYCODONE-ACETAMINOPHEN 5-325 MG PO TABS
2.0000 | ORAL_TABLET | Freq: Once | ORAL | Status: AC
  Administered 2021-10-10: 2 via ORAL
  Filled 2021-10-10: qty 2
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
## 2021-10-11 MED FILL — Oxycodone w/ Acetaminophen Tab 5-325 MG: ORAL | Qty: 6 | Status: AC

## 2022-01-25 ENCOUNTER — Ambulatory Visit
Admission: EM | Admit: 2022-01-25 | Discharge: 2022-01-25 | Disposition: A | Discharge status: Home / Self Care | Payer: Medicaid Other | Attending: Family Medicine | Admitting: Family Medicine

## 2022-01-25 DIAGNOSIS — J22 Unspecified acute lower respiratory infection: Secondary | ICD-10-CM | POA: Diagnosis not present

## 2022-01-25 MED ORDER — ALBUTEROL SULFATE HFA 108 (90 BASE) MCG/ACT IN AERS: 2.0000 | INHALATION_SPRAY | RESPIRATORY_TRACT | 0 refills | Status: DC | PRN

## 2022-01-25 MED ORDER — AMOXICILLIN-POT CLAVULANATE 875-125 MG PO TABS: 1.0000 | ORAL_TABLET | Freq: Two times a day (BID) | ORAL | 0 refills | Status: DC

## 2022-01-25 MED ORDER — PREDNISONE 20 MG PO TABS: 40.0000 mg | ORAL_TABLET | Freq: Every day | ORAL | 0 refills | Status: DC

## 2022-01-25 MED ORDER — PROMETHAZINE-DM 6.25-15 MG/5ML PO SYRP: 5.0000 mL | ORAL_SOLUTION | Freq: Four times a day (QID) | ORAL | 0 refills | Status: DC | PRN
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
## 2022-01-25 NOTE — ED Provider Notes (Signed)
[facility]    CSN: [id number] Arrival date & time: 01/25/22  4158      History   Chief Complaint No chief complaint on file.   HPI Alexander Pope is a 41 y.o. male.   Presenting today with 1.5 weeks of progressively worsening congestion, headache, sore throat, cough, chest tightness, wheezing.  Denies fever, chills, chest pain, abdominal pain, nausea vomiting or diarrhea.  So far trying DayQuil, NyQuil, other cough medicines with no relief.  No known history of chronic pulmonary disease.  Does smoke cigarettes.    Past Medical History:  Diagnosis Date   Anxiety    Back pain    Depression    Hypertension     Patient Active Problem List   Diagnosis Date Noted   Acute renal failure (ARF) (HCC) 11/15/2020   AKI (acute kidney injury) (HCC) 11/14/2020   Chest pain 11/14/2020   HTN (hypertension) 11/14/2020   Tobacco abuse 11/14/2020   Alcohol dependence with uncomplicated withdrawal (HCC)    Opioid dependence with opioid-induced mood disorder (HCC)    Alcohol dependence (HCC) 10/10/2014   Opioid dependence (HCC) 10/10/2014   Substance induced mood disorder (HCC) 10/10/2014    Past Surgical History:  Procedure Laterality Date   BACK SURGERY         Home Medications    Prior to Admission medications   Medication Sig Start Date End Date Taking? Authorizing Provider  albuterol (VENTOLIN HFA) 108 (90 Base) MCG/ACT inhaler Inhale 2 puffs into the lungs every 4 (four) hours as needed for wheezing or shortness of breath. 01/25/22  Yes Particia Nearing, PA-C  amoxicillin-clavulanate (AUGMENTIN) 875-125 MG tablet Take 1 tablet by mouth every 12 (twelve) hours. 01/25/22  Yes Particia Nearing, PA-C  predniSONE (DELTASONE) 20 MG tablet Take 2 tablets (40 mg total) by mouth daily with breakfast. 01/25/22  Yes Particia Nearing, PA-C  promethazine-dextromethorphan (PROMETHAZINE-DM) 6.25-15 MG/5ML syrup Take 5 mLs by mouth 4 (four) times daily as  needed. 01/25/22  Yes Particia Nearing, PA-C  ALPRAZolam Prudy Feeler) 1 MG tablet Take 1 mg by mouth at bedtime as needed for anxiety.    [provider]  amphetamine-dextroamphetamine (ADDERALL) 30 MG tablet Take 30 mg by mouth 2 (two) times daily.    [provider]  gabapentin (NEURONTIN) 300 MG capsule Take 1 capsule (300 mg total) by mouth 3 (three) times daily. 10/13/14   Rankin, Shuvon B, NP  INVEGA SUSTENNA 234 MG/1.5ML SUSY injection Inject 234 mg into the muscle every 30 (thirty) days. 11/02/20   [provider]  lisinopril-hydrochlorothiazide (ZESTORETIC) 20-12.5 MG tablet Take 1 tablet by mouth daily. 07/05/19   Triplett, Tammy, PA-C  metoprolol tartrate (LOPRESSOR) 50 MG tablet Take 1 tablet (50 mg total) by mouth 2 (two) times daily. 07/05/19   Triplett, Tammy, PA-C  oxyCODONE-acetaminophen (PERCOCET) 5-325 MG tablet Take 1-2 tablets by mouth every 6 (six) hours as needed for severe pain. 10/10/21   Mesner, Barbara Cower, MD  oxyCODONE-acetaminophen (PERCOCET/ROXICET) 5-325 MG tablet Take 2 tablets by mouth every 8 (eight) hours as needed for severe pain. 10/10/21   Mesner, Barbara Cower, MD  QUEtiapine (SEROQUEL) 50 MG tablet Take 1-2 tablets by mouth at bedtime. 11/08/20   [provider]  hydrochlorothiazide (HYDRODIURIL) 25 MG tablet Take 1 tablet (25 mg total) by mouth daily. 10/22/15 07/05/19  Ward, Layla Maw, DO    Family History Family History  Problem Relation Age of Onset   Stroke Mother     Social  History Social History   Tobacco Use   Smoking status: Every Day    Packs/day: 1.00    Types: Cigarettes   Smokeless tobacco: Never  Vaping Use   Vaping Use: Never used  Substance Use Topics   Alcohol use: Yes    Alcohol/week: 6.0 standard drinks of alcohol    Types: 6 Cans of beer per week    Comment: occasionally    Drug use: Not Currently    Types: Cocaine, Benzodiazepines    Comment: "Molly"     Allergies   Patient has no known  allergies.   Review of Systems Review of Systems Per HPI  Physical Exam Triage Vital Signs ED Triage Vitals  Enc Vitals Group     BP 01/25/22 0906 (!) 144/94     Pulse Rate 01/25/22 0906 98     Resp 01/25/22 0906 20     Temp 01/25/22 0906 99 F (37.2 C)     Temp Source 01/25/22 0906 Oral     SpO2 01/25/22 0906 92 %     Weight --      Height --      Head Circumference --      Peak Flow --      Pain Score 01/25/22 1010 0     Pain Loc --      Pain Edu? --      Excl. in GC? --    No data found.  Updated Vital Signs BP (!) 144/94 (BP Location: Right Arm)   Pulse 98   Temp 99 F (37.2 C) (Oral)   Resp 20   SpO2 92%   Visual Acuity Right Eye Distance:   Left Eye Distance:   Bilateral Distance:    Right Eye Near:   Left Eye Near:    Bilateral Near:     Physical Exam Vitals and nursing note reviewed.  Constitutional:      Appearance: He is well-developed.  HENT:     Head: Atraumatic.     Right Ear: External ear normal.     Left Ear: External ear normal.     Nose: Congestion present.     Mouth/Throat:     Pharynx: Posterior oropharyngeal erythema present. No oropharyngeal exudate.  Eyes:     Conjunctiva/sclera: Conjunctivae normal.     Pupils: Pupils are equal, round, and reactive to light.  Cardiovascular:     Rate and Rhythm: Normal rate and regular rhythm.  Pulmonary:     Effort: Pulmonary effort is normal. No respiratory distress.     Breath sounds: Wheezing present. No rales.  Musculoskeletal:        General: Normal range of motion.     Cervical back: Normal range of motion and neck supple.  Lymphadenopathy:     Cervical: No cervical adenopathy.  Skin:    General: Skin is warm and dry.  Neurological:     Mental Status: He is alert and oriented to person, place, and time.  Psychiatric:        Behavior: Behavior normal.      UC Treatments / Results  Labs (all labs ordered are listed, but only abnormal results are displayed) Labs Reviewed -  No data to display  EKG   Radiology No results found.  Procedures Procedures (including critical care time)  Medications Ordered in UC Medications - No data to display  Initial Impression / Assessment and Plan / UC Course  I have reviewed the triage vital signs and the nursing notes.  Pertinent labs & imaging results that were available during my care of the patient were reviewed by me and considered in my medical decision making (see chart for details).     Given duration and worsening course, treat with prednisone, Phenergan DM, albuterol inhaler and start Augmentin if not improving over the next few days.  Return for worsening symptoms.  Final Clinical Impressions(s) / UC Diagnoses   Final diagnoses:  Lower respiratory infection     Discharge Instructions      Try the prednisone, cough syrup, inhaler as well as over-the-counter cold and congestion medications for several days and if not improving or continuing to worsen start the antibiotic that was sent over.    ED Prescriptions     Medication Sig Dispense Auth. Provider   predniSONE (DELTASONE) 20 MG tablet Take 2 tablets (40 mg total) by mouth daily with breakfast. 10 tablet Particia Nearing, PA-C   promethazine-dextromethorphan (PROMETHAZINE-DM) 6.25-15 MG/5ML syrup Take 5 mLs by mouth 4 (four) times daily as needed. 100 mL Particia Nearing, PA-C   albuterol (VENTOLIN HFA) 108 (90 Base) MCG/ACT inhaler Inhale 2 puffs into the lungs every 4 (four) hours as needed for wheezing or shortness of breath. 18 g Particia Nearing, PA-C   amoxicillin-clavulanate (AUGMENTIN) 875-125 MG tablet Take 1 tablet by mouth every 12 (twelve) hours. 14 tablet Particia Nearing, [place]      PDMP not reviewed this encounter.   Particia Nearing, [place] 01/25/22 1215

## 2022-01-25 NOTE — Discharge Instructions (Signed)
Try the prednisone, cough syrup, inhaler as well as over-the-counter cold and congestion medications for several days and if not improving or continuing to worsen start the antibiotic that was sent over.

## 2022-01-31 ENCOUNTER — Ambulatory Visit
Admission: EM | Admit: 2022-01-31 | Discharge: 2022-01-31 | Disposition: A | Discharge status: Home / Self Care | Payer: Medicaid Other | Attending: Nurse Practitioner | Admitting: Nurse Practitioner

## 2022-01-31 ENCOUNTER — Ambulatory Visit (INDEPENDENT_AMBULATORY_CARE_PROVIDER_SITE_OTHER): Payer: Medicaid Other

## 2022-01-31 DIAGNOSIS — J209 Acute bronchitis, unspecified: Secondary | ICD-10-CM

## 2022-01-31 DIAGNOSIS — R059 Cough, unspecified: Secondary | ICD-10-CM

## 2022-01-31 MED ORDER — PREDNISONE 20 MG PO TABS: 40.0000 mg | ORAL_TABLET | Freq: Every day | ORAL | 0 refills | Status: AC

## 2022-01-31 MED ORDER — BENZONATATE 100 MG PO CAPS: 100.0000 mg | ORAL_CAPSULE | Freq: Three times a day (TID) | ORAL | 0 refills | Status: DC | PRN

## 2022-01-31 NOTE — ED Provider Notes (Signed)
[facility]    CSN: [id number] Arrival date & time: 01/31/22  1201      History   Chief Complaint Chief Complaint  Patient presents with   Cough    HPI Alexander Pope is a 41 y.o. male.   The history is provided by the patient.   Patient presents for follow-up of a cough that has been present over the past 3 weeks.  Patient was seen in this clinic on 01/25/2022 and diagnosed with a lower respiratory infection.  He was prescribed prednisone, Promethazine DM, albuterol inhaler, and Augmentin.  Patient reports that he has approximately 2 days of the Augmentin remaining.  He states the cough is worse.  He states cough is worse at night.  He states the cough is keeping him from sleeping. Patient also endorses some mild shortness of breath.  He denies fever, chills, sore throat, headache, ear pain, nasal congestion, or runny nose.   He reports that he does smoke, and has continued to smoke while his symptoms have persisted. Past Medical History:  Diagnosis Date   Anxiety    Back pain    Depression    Hypertension     Patient Active Problem List   Diagnosis Date Noted   Acute renal failure (ARF) (HCC) 11/15/2020   AKI (acute kidney injury) (HCC) 11/14/2020   Chest pain 11/14/2020   HTN (hypertension) 11/14/2020   Tobacco abuse 11/14/2020   Alcohol dependence with uncomplicated withdrawal (HCC)    Opioid dependence with opioid-induced mood disorder (HCC)    Alcohol dependence (HCC) 10/10/2014   Opioid dependence (HCC) 10/10/2014   Substance induced mood disorder (HCC) 10/10/2014    Past Surgical History:  Procedure Laterality Date   BACK SURGERY         Home Medications    Prior to Admission medications   Medication Sig Start Date End Date Taking? Authorizing Provider  benzonatate (TESSALON PERLES) 100 MG capsule Take 1 capsule (100 mg total) by mouth 3 (three) times daily as needed for cough. 01/31/22  Yes Mekaela Azizi-Warren, Sadie Haber, NP  predniSONE  (DELTASONE) 20 MG tablet Take 2 tablets (40 mg total) by mouth daily with breakfast for 5 days. 01/31/22 02/05/22 Yes Licet Dunphy-Warren, Sadie Haber, NP  albuterol (VENTOLIN HFA) 108 (90 Base) MCG/ACT inhaler Inhale 2 puffs into the lungs every 4 (four) hours as needed for wheezing or shortness of breath. 01/25/22   Particia Nearing, PA-C  ALPRAZolam Prudy Feeler) 1 MG tablet Take 1 mg by mouth at bedtime as needed for anxiety.    [provider]  amoxicillin-clavulanate (AUGMENTIN) 875-125 MG tablet Take 1 tablet by mouth every 12 (twelve) hours. 01/25/22   Particia Nearing, PA-C  amphetamine-dextroamphetamine (ADDERALL) 30 MG tablet Take 30 mg by mouth 2 (two) times daily.    [provider]  gabapentin (NEURONTIN) 300 MG capsule Take 1 capsule (300 mg total) by mouth 3 (three) times daily. 10/13/14   Rankin, Shuvon B, NP  INVEGA SUSTENNA 234 MG/1.5ML SUSY injection Inject 234 mg into the muscle every 30 (thirty) days. 11/02/20   [provider]  lisinopril-hydrochlorothiazide (ZESTORETIC) 20-12.5 MG tablet Take 1 tablet by mouth daily. 07/05/19   Triplett, Tammy, PA-C  metoprolol tartrate (LOPRESSOR) 50 MG tablet Take 1 tablet (50 mg total) by mouth 2 (two) times daily. 07/05/19   Triplett, Tammy, PA-C  oxyCODONE-acetaminophen (PERCOCET) 5-325 MG tablet Take 1-2 tablets by mouth every 6 (six) hours as needed for severe pain. 10/10/21   Mesner, Barbara Cower,  MD  oxyCODONE-acetaminophen (PERCOCET/ROXICET) 5-325 MG tablet Take 2 tablets by mouth every 8 (eight) hours as needed for severe pain. 10/10/21   Mesner, Barbara Cower, MD  promethazine-dextromethorphan (PROMETHAZINE-DM) 6.25-15 MG/5ML syrup Take 5 mLs by mouth 4 (four) times daily as needed. 01/25/22   Particia Nearing, PA-C  QUEtiapine (SEROQUEL) 50 MG tablet Take 1-2 tablets by mouth at bedtime. 11/08/20   [provider]  hydrochlorothiazide (HYDRODIURIL) 25 MG tablet Take 1 tablet (25 mg total) by mouth daily. 10/22/15  07/05/19  Ward, Layla Maw, DO    Family History Family History  Problem Relation Age of Onset   Stroke Mother     Social History Social History   Tobacco Use   Smoking status: Every Day    Packs/day: 1.00    Types: Cigarettes   Smokeless tobacco: Never  Vaping Use   Vaping Use: Never used  Substance Use Topics   Alcohol use: Yes    Alcohol/week: 6.0 standard drinks of alcohol    Types: 6 Cans of beer per week    Comment: occasionally    Drug use: Not Currently    Types: Cocaine, Benzodiazepines    Comment: "Molly"     Allergies   Patient has no known allergies.   Review of Systems Review of Systems Per HPI  Physical Exam Triage Vital Signs ED Triage Vitals  Enc Vitals Group     BP 01/31/22 1431 (!) 173/108     Pulse Rate 01/31/22 1431 (!) 112     Resp 01/31/22 1431 (!) 22     Temp 01/31/22 1431 99.2 F (37.3 C)     Temp Source 01/31/22 1431 Oral     SpO2 01/31/22 1431 95 %     Weight --      Height --      Head Circumference --      Peak Flow --      Pain Score 01/31/22 1430 0     Pain Loc --      Pain Edu? --      Excl. in GC? --    No data found.  Updated Vital Signs BP (!) 173/108 (BP Location: Right Arm)   Pulse (!) 112   Temp 99.2 F (37.3 C) (Oral)   Resp (!) 22   SpO2 95%   Visual Acuity Right Eye Distance:   Left Eye Distance:   Bilateral Distance:    Right Eye Near:   Left Eye Near:    Bilateral Near:     Physical Exam Vitals and nursing note reviewed.  Constitutional:      General: He is not in acute distress.    Appearance: Normal appearance.  HENT:     Head: Normocephalic.     Right Ear: Tympanic membrane, ear canal and external ear normal.     Left Ear: Tympanic membrane, ear canal and external ear normal.     Nose: Nose normal.     Mouth/Throat:     Mouth: Mucous membranes are moist.     Pharynx: Posterior oropharyngeal erythema present. No oropharyngeal exudate.  Eyes:     Extraocular Movements: Extraocular  movements intact.     Conjunctiva/sclera: Conjunctivae normal.     Pupils: Pupils are equal, round, and reactive to light.  Cardiovascular:     Rate and Rhythm: Normal rate and regular rhythm.     Pulses: Normal pulses.     Heart sounds: Normal heart sounds.  Pulmonary:     Effort: Pulmonary effort  is normal. No respiratory distress.     Breath sounds: Normal breath sounds. No stridor. No wheezing, rhonchi or rales.  Abdominal:     General: Bowel sounds are normal.     Palpations: Abdomen is soft.     Tenderness: There is no abdominal tenderness.  Musculoskeletal:     Cervical back: Normal range of motion.  Lymphadenopathy:     Cervical: No cervical adenopathy.  Skin:    General: Skin is warm and dry.  Neurological:     General: No focal deficit present.     Mental Status: He is alert and oriented to person, place, and time.  Psychiatric:        Mood and Affect: Mood normal.        Behavior: Behavior normal.      UC Treatments / Results  Labs (all labs ordered are listed, but only abnormal results are displayed) Labs Reviewed - No data to display  EKG   Radiology DG Chest 2 View  Result Date: 01/31/2022 CLINICAL DATA:  Cough x3 weeks EXAM: CHEST - 2 VIEW COMPARISON:  10/10/2021 FINDINGS: Cardiac size is within normal limits. There are no signs of pulmonary edema or focal pulmonary consolidation. There is no significant pleural effusion or pneumothorax. There is interval clearing of small left pleural effusion. IMPRESSION: No active cardiopulmonary disease. Electronically Signed   By: Ernie Avena M.D.   On: 01/31/2022 14:57    Procedures Procedures (including critical care time)  Medications Ordered in UC Medications - No data to display  Initial Impression / Assessment and Plan / UC Course  I have reviewed the triage vital signs and the nursing notes.  Pertinent labs & imaging results that were available during my care of the patient were reviewed by me  and considered in my medical decision making (see chart for details).  The patient is well-appearing, he is in no acute distress, vital signs are stable.  Symptoms are consistent with acute bronchitis.  Patient is currently taking Augmentin, Promethazine DM, and using an albuterol inhaler.  He has completed a recent round of steroids.  Lung sounds are clear throughout.  Will provide additional symptomatic treatment with an additional 5 days of prednisone 40 mg and Tessalon Perles and he can take throughout the day as the other cough and cold medications he currently is taking is impacting his blood pressure and heart rate.  Patient was advised regarding the course of bronchitis and that the cough could continue to persist.  Supportive care recommendations were provided to the patient to include smoking cessation, use of cough drops and throat lozenges, and increasing his fluid intake.  Patient was advised that if symptoms do not improve, recommend that he follow-up with his primary care physician for further evaluation.  Patient was advised to go to the emergency department if he develops worsening shortness of breath, difficulty breathing, becomes unable to speak in a complete sentence.  Patient verbalizes understanding.  All questions were answered.  Patient stable for discharge.   Final Clinical Impressions(s) / UC Diagnoses   Final diagnoses:  Acute bronchitis, unspecified organism     Discharge Instructions      The chest x-ray is negative for pneumonia.  Please continue the Augmentin, Promethazine DM, and albuterol inhaler you are currently using. Take medication as prescribed. Increase fluids and allow for plenty of rest. May take over-the-counter Tylenol or ibuprofen as needed for pain, fever, general discomfort. Recommend cutting back on smoking while your cough symptoms persist  as this will make your symptoms much worse.  Also recommend the use of a humidifier to use during bedtime and  sleeping elevated on pillows while cough symptoms persist. Please be advised that a cough of this nature may last for several weeks.  Please follow-up with your primary care physician if symptoms fail to improve. Follow-up as needed.     ED Prescriptions     Medication Sig Dispense Auth. Provider   predniSONE (DELTASONE) 20 MG tablet Take 2 tablets (40 mg total) by mouth daily with breakfast for 5 days. 10 tablet Azariah Latendresse-Warren, Sadie Haberhristie J, NP   benzonatate (TESSALON PERLES) 100 MG capsule Take 1 capsule (100 mg total) by mouth 3 (three) times daily as needed for cough. 30 capsule Lilianna Case-Warren, Sadie Haberhristie J, NP      PDMP not reviewed this encounter.   Abran CantorLeath-Warren, Teryl Mcconaghy J, NP 01/31/22 1510

## 2022-01-31 NOTE — ED Triage Notes (Signed)
Pt reports cough and chest congestion x 3 weeks. Albuterol and amoxicillin gives no relief.

## 2022-01-31 NOTE — Discharge Instructions (Addendum)
The chest x-ray is negative for pneumonia.  Please continue the Augmentin, Promethazine DM, and albuterol inhaler you are currently using. Take medication as prescribed. Increase fluids and allow for plenty of rest. May take over-the-counter Tylenol or ibuprofen as needed for pain, fever, general discomfort. Recommend cutting back on smoking while your cough symptoms persist as this will make your symptoms much worse.  Also recommend the use of a humidifier to use during bedtime and sleeping elevated on pillows while cough symptoms persist. Please be advised that a cough of this nature may last for several weeks.  Please follow-up with your primary care physician if symptoms fail to improve. Follow-up as needed.

## 2022-02-25 IMAGING — DX DG CHEST 1V PORT
1 series · 1 of 1 positions shown · non-contrast
Comparison: 09/28/2019

CLINICAL DATA: Shortness of breath

EXAM:
PORTABLE CHEST 1 VIEW

[chest ap]
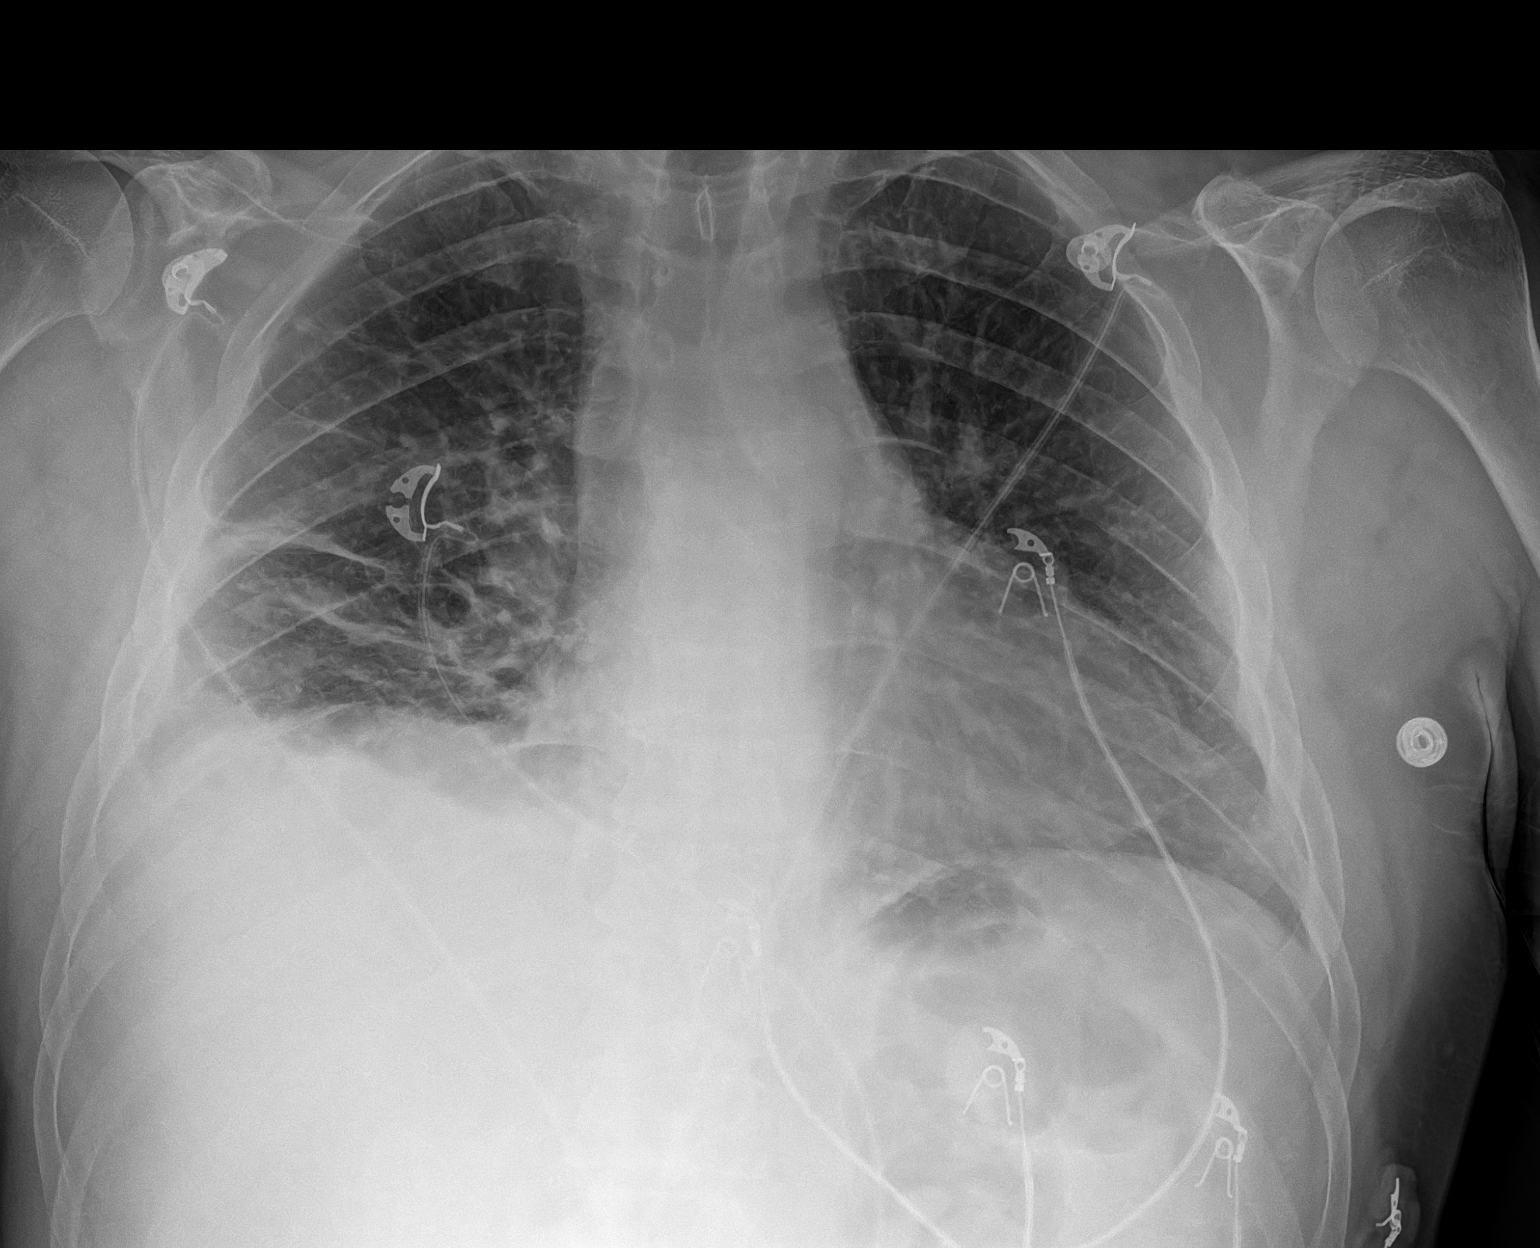

[1 of 1 positions shown; findings below may reference images not displayed]

FINDINGS: Right basilar linear airspace disease likely reflecting atelectasis.
Trace right pleural effusion. No pneumothorax. Stable
cardiomediastinal silhouette.

Nondisplaced fracture of the right sixth lateral rib.
IMPRESSION: 1. Nondisplaced fracture of the right lateral sixth rib. Trace right
pleural effusion and right basilar atelectasis.

## 2022-03-19 ENCOUNTER — Encounter (HOSPITAL_COMMUNITY): Payer: Self-pay

## 2022-03-19 ENCOUNTER — Emergency Department (HOSPITAL_COMMUNITY)
Admission: EM | Admit: 2022-03-19 | Discharge: 2022-03-20 | Disposition: A | Discharge status: Other Acute Inpatient Hospital | Payer: No Typology Code available for payment source | Attending: Emergency Medicine | Admitting: Emergency Medicine

## 2022-03-19 ENCOUNTER — Other Ambulatory Visit: Payer: Self-pay

## 2022-03-19 DIAGNOSIS — Y902 Blood alcohol level of 40-59 mg/100 ml: Secondary | ICD-10-CM | POA: Insufficient documentation

## 2022-03-19 DIAGNOSIS — F17210 Nicotine dependence, cigarettes, uncomplicated: Secondary | ICD-10-CM | POA: Insufficient documentation

## 2022-03-19 DIAGNOSIS — F250 Schizoaffective disorder, bipolar type: Secondary | ICD-10-CM | POA: Insufficient documentation

## 2022-03-19 DIAGNOSIS — Z79899 Other long term (current) drug therapy: Secondary | ICD-10-CM | POA: Insufficient documentation

## 2022-03-19 DIAGNOSIS — U071 COVID-19: Secondary | ICD-10-CM | POA: Insufficient documentation

## 2022-03-19 DIAGNOSIS — F1020 Alcohol dependence, uncomplicated: Secondary | ICD-10-CM | POA: Insufficient documentation

## 2022-03-19 DIAGNOSIS — F1914 Other psychoactive substance abuse with psychoactive substance-induced mood disorder: Secondary | ICD-10-CM | POA: Insufficient documentation

## 2022-03-19 DIAGNOSIS — I10 Essential (primary) hypertension: Secondary | ICD-10-CM

## 2022-03-19 DIAGNOSIS — F1994 Other psychoactive substance use, unspecified with psychoactive substance-induced mood disorder: Secondary | ICD-10-CM | POA: Diagnosis present

## 2022-03-19 DIAGNOSIS — F259 Schizoaffective disorder, unspecified: Secondary | ICD-10-CM | POA: Insufficient documentation

## 2022-03-19 DIAGNOSIS — R443 Hallucinations, unspecified: Secondary | ICD-10-CM

## 2022-03-19 HISTORY — DX: Schizoaffective disorder, unspecified: F25.9

## 2022-03-19 LAB — COMPREHENSIVE METABOLIC PANEL
ALT: 25 U/L (ref 0–44)
AST: 28 U/L (ref 15–41)
Albumin: 3.9 g/dL (ref 3.5–5.0)
Alkaline Phosphatase: 54 U/L (ref 38–126)
Anion gap: 13 (ref 5–15)
BUN: 10 mg/dL (ref 6–20)
CO2: 23 mmol/L (ref 22–32)
Calcium: 8.7 mg/dL — ABNORMAL LOW (ref 8.9–10.3)
Chloride: 104 mmol/L (ref 98–111)
Creatinine, Ser: 1.06 mg/dL (ref 0.61–1.24)
GFR, Estimated: >60 mL/min (ref >60)
Glucose, Bld: 102 mg/dL — ABNORMAL HIGH (ref 70–99)
Potassium: 4 mmol/L (ref 3.5–5.1)
Sodium: 140 mmol/L (ref 135–145)
Total Bilirubin: 0.1 mg/dL — ABNORMAL LOW (ref 0.3–1.2)
Total Protein: 7.5 g/dL (ref 6.5–8.1)

## 2022-03-19 LAB — CBC WITH DIFFERENTIAL/PLATELET
Abs Immature Granulocytes: 0.04 10*3/uL (ref 0.00–0.07)
Basophils Absolute: 0.1 10*3/uL (ref 0.0–0.1)
Basophils Relative: 1 %
Eosinophils Absolute: 0.2 10*3/uL (ref 0.0–0.5)
Eosinophils Relative: 2 %
HCT: 45.3 % (ref 39.0–52.0)
Hemoglobin: 15.3 g/dL (ref 13.0–17.0)
Immature Granulocytes: 0 %
Lymphocytes Relative: 27 %
Lymphs Abs: 2.5 10*3/uL (ref 0.7–4.0)
MCH: 30.5 pg (ref 26.0–34.0)
MCHC: 33.8 g/dL (ref 30.0–36.0)
MCV: 90.4 fL (ref 80.0–100.0)
Monocytes Absolute: 0.5 10*3/uL (ref 0.1–1.0)
Monocytes Relative: 6 %
Neutro Abs: 5.7 10*3/uL (ref 1.7–7.7)
Neutrophils Relative %: 64 %
Platelets: 197 10*3/uL (ref 150–400)
RBC: 5.01 MIL/uL (ref 4.22–5.81)
RDW: 13.2 % (ref 11.5–15.5)
WBC: 9 10*3/uL (ref 4.0–10.5)
nRBC: 0 % (ref 0.0–0.2)

## 2022-03-19 LAB — RAPID URINE DRUG SCREEN, HOSP PERFORMED
Amphetamines: NOT DETECTED
Barbiturates: NOT DETECTED
Benzodiazepines: NOT DETECTED
Cocaine: NOT DETECTED
Opiates: NOT DETECTED
Tetrahydrocannabinol: POSITIVE — AB

## 2022-03-19 LAB — ETHANOL: Alcohol, Ethyl (B): 50 mg/dL — ABNORMAL HIGH (ref <10)

## 2022-03-19 LAB — SARS CORONAVIRUS 2 BY RT PCR: SARS Coronavirus 2 by RT PCR: POSITIVE — AB

## 2022-03-19 MED ORDER — ACETAMINOPHEN 325 MG PO TABS: 650.0000 mg | ORAL_TABLET | ORAL | Status: DC | PRN

## 2022-03-19 MED ORDER — ALUM & MAG HYDROXIDE-SIMETH 200-200-20 MG/5ML PO SUSP: 30.0000 mL | ORAL | Status: DC | PRN

## 2022-03-19 MED ORDER — ALBUTEROL SULFATE HFA 108 (90 BASE) MCG/ACT IN AERS: 2.0000 | INHALATION_SPRAY | RESPIRATORY_TRACT | Status: DC | PRN

## 2022-03-19 MED ORDER — QUETIAPINE FUMARATE 25 MG PO TABS: 50.0000 mg | ORAL_TABLET | Freq: Every evening | ORAL | Status: DC | PRN

## 2022-03-19 MED ORDER — QUETIAPINE FUMARATE 100 MG PO TABS
100.0000 mg | ORAL_TABLET | Freq: Every day | ORAL | Status: DC
  Administered 2022-03-19: 100 mg via ORAL
  Filled 2022-03-19 (×2): qty 1

## 2022-03-19 MED ORDER — QUETIAPINE FUMARATE 25 MG PO TABS: 50.0000 mg | ORAL_TABLET | Freq: Every day | ORAL | Status: DC

## 2022-03-19 MED ORDER — LISINOPRIL-HYDROCHLOROTHIAZIDE 20-12.5 MG PO TABS: 1.0000 | ORAL_TABLET | Freq: Every day | ORAL | Status: DC

## 2022-03-19 MED ORDER — METOPROLOL TARTRATE 50 MG PO TABS
50.0000 mg | ORAL_TABLET | Freq: Two times a day (BID) | ORAL | Status: DC
  Administered 2022-03-19 – 2022-03-20 (×3): 50 mg via ORAL
  Filled 2022-03-19 (×3): qty 1

## 2022-03-19 MED ORDER — LISINOPRIL 10 MG PO TABS
20.0000 mg | ORAL_TABLET | Freq: Every day | ORAL | Status: DC
  Administered 2022-03-19 – 2022-03-20 (×2): 20 mg via ORAL
  Filled 2022-03-19 (×2): qty 2

## 2022-03-19 MED ORDER — HYDROCHLOROTHIAZIDE 12.5 MG PO TABS
12.5000 mg | ORAL_TABLET | Freq: Every day | ORAL | Status: DC
  Administered 2022-03-19 – 2022-03-20 (×2): 12.5 mg via ORAL
  Filled 2022-03-19 (×2): qty 1

## 2022-03-19 MED ORDER — CLONAZEPAM 0.5 MG PO TABS
1.0000 mg | ORAL_TABLET | Freq: Every day | ORAL | Status: DC | PRN
  Administered 2022-03-19: 1 mg via ORAL
  Filled 2022-03-19: qty 2

## 2022-03-19 MED ORDER — GABAPENTIN 300 MG PO CAPS
300.0000 mg | ORAL_CAPSULE | Freq: Three times a day (TID) | ORAL | Status: DC
  Administered 2022-03-19 – 2022-03-20 (×4): 300 mg via ORAL
  Filled 2022-03-19 (×4): qty 1

## 2022-03-19 MED ORDER — AMPHETAMINE-DEXTROAMPHETAMINE 10 MG PO TABS
30.0000 mg | ORAL_TABLET | Freq: Two times a day (BID) | ORAL | Status: DC
  Administered 2022-03-19: 30 mg via ORAL
  Filled 2022-03-19: qty 3

## 2022-03-19 MED ORDER — ZIPRASIDONE MESYLATE 20 MG IM SOLR: 20.0000 mg | Freq: Two times a day (BID) | INTRAMUSCULAR | Status: DC | PRN

## 2022-03-19 MED ORDER — NICOTINE 14 MG/24HR TD PT24
14.0000 mg | MEDICATED_PATCH | Freq: Every day | TRANSDERMAL | Status: DC
  Administered 2022-03-19 – 2022-03-20 (×2): 14 mg via TRANSDERMAL
  Filled 2022-03-19 (×2): qty 1

## 2022-03-19 NOTE — ED Notes (Signed)
This RN called telesitter to initiate telesitter monitoring. Bryson Corona Edd Fabian

## 2022-03-19 NOTE — ED Notes (Signed)
Patient sitting on bed eating breakfast tray with no complaints at present time.

## 2022-03-19 NOTE — ED Notes (Signed)
Security cataloged pt's money & went over pt with wand.

## 2022-03-19 NOTE — ED Triage Notes (Addendum)
Pt states that he started having hallucinations tonight. States that he can not tell what is real and what is not. Pt states that he has had hallucinations before but that he can usually deal with them.   Pt states he thought he was in his car with his son being chased by monkeys.

## 2022-03-19 NOTE — Consult Note (Signed)
Telepsych Consultation   Reason for Consult:  Telepysych Assessment  Referring Physician:  Delora Fuel, MD  Location of Patient:    [facility] Location of Provider: Other: virtual home office  Patient Identification: DARRIC TULLAR MRN:  [id number] Principal Diagnosis: Schizoaffective disorder [facility]) Diagnosis:  Principal Problem:   Schizoaffective disorder ([place]) Active Problems:   Alcohol dependence ([place])   Substance induced mood disorder ([place])   Total Time spent with patient: 30 minutes  Subjective:   TORE WALDVOGEL is a 42 y.o. male patient admitted with audible and visual hallucinations.  HPI:   Patient seen via telepsych by this provider; chart reviewed and consulted with Dr. Dwyane Dee on 03/19/22.  On evaluation OKOYE HAGUE is seen in hospital room, seated position on gurney.  He appears dis shelved, hair is uncombed. Pt is alert and oriented x4; reports he's here because he was haivng hallucinations and he decided to come to the hospital.  Reports he has dx of schizoaffective disorder and has been having AVH on and off for the past 3 years.  Reports AVH improves with his mental health meds and sleep.  Reports he did not sleep good  last night because he has been out of his medications, "seroquel" which he believes triggered his AVH.  Pt reports he lost his insurance from August -December 2023 and was off his medication during that time.  He reconnected in December and currently sees Ander Slade, NP at  [facility]. Last visit was a few days ago,reports she prescribes seroquel, used prescribe invega but he has not not this medication for several months, he again cites insurance lapse and inability to afford this medication.  [When asked why he did not restart invega at his outpatient visit he states, "because I wasn't as truthful with her."   At present, pt denies suicidal, homicidal ideation; he denies audible/visual hallucinations but does reports last occurrence was prior to  admission on 03/18/2022.  Pt denies command hallucinations, denies hx of such.    Reports last invega was 4 months ago; No longer takes adderall, was replaced with strattera but currently not taking either;   Pt reports he lives at home with his mother and "son."  Pt gives permission for this writer to discuss his medical concerns with his mother and to gain collateral.   During evaluation Wilhelmenia Blase is sitting on hospital gurney; He is  alert/oriented x 4; anxious but cooperative; and mood congruent with affect.  Patient is speaking in a clear tone at moderate volume, and normal pace; with good eye contact.  His thought process is coherent.  There is no indication that he is currently responding to internal/external stimuli.  He has delusions about his child, thinks she is as home but per his mother she is in another state. Patient denies suicidal/self-harm/homicidal ideation.  Patient has remained anxious throughout assessment and has answered questions appropriately.    Spoke with his mother, Carver Georgia, who was sitting in the hospital lobby.  She reports her son has schizoaffective disorder and is not taking his medications as prescribed.  She reports pt is drinking alcohol and using marijuana which she told him is not a good idea but he does not listen to her.  She reports he would benefit from psych admission to get stabilized on med.   She states pt used to take LAI but she did not believe it worked for him even after being on it for a few months.  She collaborates pt did lose his insurance for a few months and also reports he is seeing NP Marian Sorrow at [facility].  She reports prior to admission, pt had hallucinations that his daughter, Westly Pam, who lives in another state was in the house with them.     Per ED Provider Admission Assessment  Chief Complaint  Patient presents with   Hallucinations      DARIYON URQUILLA is a 42 y.o. male.   The history is provided by the patient.  He  has a history of hypertension, depression, schizoaffective disorder and states that he has been having hallucinations.  He actually has been having hallucinations for a long time, but tonight he stated he was not able to distinguish between what was hallucination what was real.  He generally will wake up about an hour after falling asleep and that is when he usually has a hallucinations.  Today, he states he saw monkey in the woods and could hear it as well.  He sometimes hears voices but denies command hallucinations.  He denies homicidal or suicidal ideation.  He does admit to having had 3 beers tonight, and to smoking some marijuana.  He denies other drug use.  Past Psychiatric History: as outlined below  Risk to Self:  yes Risk to Others:  no Prior Inpatient Therapy: yes  Prior Outpatient Therapy:  yes, per pt sees NP at [facility]  Past Medical History:  Past Medical History:  Diagnosis Date   Anxiety    Back pain    Depression    Hypertension    Schizoaffective disorder ([place])     Past Surgical History:  Procedure Laterality Date   BACK SURGERY     Family History:  Family History  Problem Relation Age of Onset   Stroke Mother    Family Psychiatric  History: denies Social History:  Social History   Substance and Sexual Activity  Alcohol Use Yes   Alcohol/week: 6.0 standard drinks of alcohol   Types: 6 Cans of beer per week   Comment: occasionally      Social History   Substance and Sexual Activity  Drug Use Not Currently   Types: Cocaine, Benzodiazepines   Comment: "Molly"    Social History   Socioeconomic History   Marital status: Single    Spouse name: Not on file   Number of children: Not on file   Years of education: Not on file   Highest education level: Not on file  Occupational History   Not on file  Tobacco Use   Smoking status: Every Day    Packs/day: 1.00    Types: Cigarettes   Smokeless tobacco: Never  Vaping Use   Vaping Use: Never used   Substance and Sexual Activity   Alcohol use: Yes    Alcohol/week: 6.0 standard drinks of alcohol    Types: 6 Cans of beer per week    Comment: occasionally    Drug use: Not Currently    Types: Cocaine, Benzodiazepines    Comment: "Molly"   Sexual activity: Not Currently  Other Topics Concern   Not on file  Social History Narrative   Not on file   Social Determinants of Health   Financial Resource Strain: Not on file  Food Insecurity: Not on file  Transportation Needs: Not on file  Physical Activity: Not on file  Stress: Not on file  Social Connections: Not on file   Additional Social History:    Allergies:  No Known  Allergies  Labs:  Results for orders placed or performed during the hospital encounter of 03/19/22 (from the past 48 hour(s))  Ethanol     Status: Abnormal   Collection Time: 03/19/22  5:35 AM  Result Value Ref Range   Alcohol, Ethyl (B) 50 (H) <10 mg/dL    Comment: (NOTE) Lowest detectable limit for serum alcohol is 10 mg/dL.  For medical purposes only. Performed at [facility], [address], [place], [place] [address]   Comprehensive metabolic panel     Status: Abnormal   Collection Time: 03/19/22  5:35 AM  Result Value Ref Range   Sodium 140 135 - 145 mmol/L   Potassium 4.0 3.5 - 5.1 mmol/L   Chloride 104 98 - 111 mmol/L   CO2 23 22 - 32 mmol/L   Glucose, Bld 102 (H) 70 - 99 mg/dL    Comment: Glucose reference range applies only to samples taken after fasting for at least 8 hours.   BUN 10 6 - 20 mg/dL   Creatinine, Ser 1.06 0.61 - 1.24 mg/dL   Calcium 8.7 (L) 8.9 - 10.3 mg/dL   Total Protein 7.5 6.5 - 8.1 g/dL   Albumin 3.9 3.5 - 5.0 g/dL   AST 28 15 - 41 U/L   ALT 25 0 - 44 U/L   Alkaline Phosphatase 54 38 - 126 U/L   Total Bilirubin 0.1 (L) 0.3 - 1.2 mg/dL   GFR, Estimated >60 >60 mL/min    Comment: (NOTE) Calculated using the CKD-EPI Creatinine Equation (2021)    Anion gap 13 5 - 15    Comment: Performed at [facility], [address], [place], [place] [address]  CBC with Differential     Status: None   Collection Time: 03/19/22  5:35 AM  Result Value Ref Range   WBC 9.0 4.0 - 10.5 K/uL   RBC 5.01 4.22 - 5.81 MIL/uL   Hemoglobin 15.3 13.0 - 17.0 g/dL   HCT 45.3 39.0 - 52.0 %   MCV 90.4 80.0 - 100.0 fL   MCH 30.5 26.0 - 34.0 pg   MCHC 33.8 30.0 - 36.0 g/dL   RDW 13.2 11.5 - 15.5 %   Platelets 197 150 - 400 K/uL   nRBC 0.0 0.0 - 0.2 %   Neutrophils Relative % 64 %   Neutro Abs 5.7 1.7 - 7.7 K/uL   Lymphocytes Relative 27 %   Lymphs Abs 2.5 0.7 - 4.0 K/uL   Monocytes Relative 6 %   Monocytes Absolute 0.5 0.1 - 1.0 K/uL   Eosinophils Relative 2 %   Eosinophils Absolute 0.2 0.0 - 0.5 K/uL   Basophils Relative 1 %   Basophils Absolute 0.1 0.0 - 0.1 K/uL   Immature Granulocytes 0 %   Abs Immature Granulocytes 0.04 0.00 - 0.07 K/uL    Comment: Performed at [facility], [address] [place],  [address]  Urine rapid drug screen (hosp performed)     Status: Abnormal   Collection Time: 03/19/22  6:40 AM  Result Value Ref Range   Opiates NONE DETECTED NONE DETECTED   Cocaine NONE DETECTED NONE DETECTED   Benzodiazepines NONE DETECTED NONE DETECTED   Amphetamines NONE DETECTED NONE DETECTED   Tetrahydrocannabinol POSITIVE (A) NONE DETECTED   Barbiturates NONE DETECTED NONE DETECTED    Comment: (NOTE) DRUG SCREEN FOR MEDICAL PURPOSES ONLY.  IF CONFIRMATION IS NEEDED FOR ANY PURPOSE, NOTIFY LAB WITHIN 5 DAYS.  LOWEST DETECTABLE LIMITS FOR URINE DRUG SCREEN Drug Class  Cutoff (ng/mL) Amphetamine and metabolites    1000 Barbiturate and metabolites    200 Benzodiazepine                 200 Opiates and metabolites        300 Cocaine and metabolites        300 THC                            50 Performed at [facility], [address] [place], [place] [address]     Medications:  Current Facility-Administered Medications  Medication Dose Route Frequency Provider Last Rate Last  Admin   acetaminophen (TYLENOL) tablet 650 mg  650 mg Oral [id number] PRN Delora Fuel, MD       albuterol (VENTOLIN HFA) 108 (90 Base) MCG/ACT inhaler 2 puff  2 puff Inhalation [id number] PRN Delora Fuel, MD       alum & mag hydroxide-simeth (MAALOX/MYLANTA) 200-200-20 MG/5ML suspension 30 mL  30 mL Oral [id number] PRN Delora Fuel, MD       gabapentin (NEURONTIN) capsule 300 mg  [id number] mg Oral TID Delora Fuel, MD   [id number] mg at 03/19/22 0911   lisinopril (ZESTRIL) tablet 20 mg  20 mg Oral Daily Delora Fuel, MD   20 mg at 03/19/22 [id number]   And   hydrochlorothiazide (HYDRODIURIL) tablet 12.5 mg  [id number] mg Oral Daily Delora Fuel, MD   [id number] mg at 03/19/22 0911   metoprolol tartrate (LOPRESSOR) tablet 50 mg  50 mg Oral BID Delora Fuel, MD   50 mg at 03/19/22 0912   nicotine (NICODERM CQ - dosed in mg/24 hours) patch 14 mg  14 mg Transdermal Daily Delora Fuel, MD   14 mg at 03/19/22 0911   QUEtiapine (SEROQUEL) tablet 100 mg  100 mg Oral Daily Mallie Darting, NP       Current Outpatient Medications  Medication Sig Dispense Refill   atomoxetine (STRATTERA) 40 MG capsule Take 80 mg by mouth daily.     clonazePAM (KLONOPIN) 1 MG tablet Take 1 mg by mouth daily as needed for anxiety.     gabapentin (NEURONTIN) 300 MG capsule Take 1 capsule (300 mg total) by mouth 3 (three) times daily. 90 capsule 0   QUEtiapine (SEROQUEL) 50 MG tablet Take 300 mg by mouth at bedtime.     albuterol (VENTOLIN HFA) 108 (90 Base) MCG/ACT inhaler Inhale 2 puffs into the lungs every 4 (four) hours as needed for wheezing or shortness of breath. (Patient not taking: Reported on 03/19/2022) 18 g 0   INVEGA SUSTENNA 234 MG/1.5ML SUSY injection Inject 234 mg into the muscle every 30 (thirty) days. (Patient not taking: Reported on 03/19/2022)     lisinopril-hydrochlorothiazide (ZESTORETIC) 20-12.5 MG tablet Take 1 tablet by mouth daily. (Patient not taking: Reported on 03/19/2022) 30 tablet 0   metoprolol tartrate (LOPRESSOR) 50 MG tablet Take 1 tablet (50 mg  total) by mouth 2 (two) times daily. (Patient not taking: Reported on 03/19/2022) 60 tablet 0    Musculoskeletal:pt moves all extremities and ambulates independently. Strength & Muscle Tone: within normal limits Gait & Station: normal Patient leans: N/A  Psychiatric Specialty Exam:  Presentation  General Appearance: Disheveled  Eye Contact:Good  Speech:Clear and Coherent  Speech Volume:Normal  Handedness:Right   Mood and Affect  Mood:Anxious; Dysphoric  Affect:Congruent; Blunt   Thought Process  Thought Processes:Goal Directed  Descriptions of Associations:Intact  Orientation:Full (Time, Place and Person)  Thought  Content:Delusions; Illogical  History of Schizophrenia/Schizoaffective disorder:Yes  Duration of Psychotic Symptoms:Greater than six months  Hallucinations:Hallucinations: Visual; Auditory Description of Auditory Hallucinations: denies command but does not state what he's hearing Description of Visual Hallucinations: seeing monkeys  Ideas of Reference:Delusions  Suicidal Thoughts:Suicidal Thoughts: No  Homicidal Thoughts:Homicidal Thoughts: No   Sensorium  Memory:Remote Fair; Recent Fair; Immediate Fair  Judgment:Poor  Insight:Lacking   Executive Functions  Concentration:Fair  Attention Span:Fair  [place]  Language:Good   Psychomotor Activity  Psychomotor Activity:Psychomotor Activity: Normal   Assets  Assets:Communication Skills; Desire for Improvement; Housing; Social Support; Financial Resources/Insurance   Sleep  Sleep:Sleep: Poor Number of Hours of Sleep: 4    Physical Exam: Physical Exam Vitals and nursing note reviewed.  Cardiovascular:     Rate and Rhythm: Normal rate.  Musculoskeletal:        General: Normal range of motion.     Cervical back: Normal range of motion.  Neurological:     Mental Status: He is alert and oriented to person, place, and time.  Psychiatric:         Attention and Perception: Attention normal. He perceives auditory hallucinations.        Mood and Affect: Mood is anxious. Affect is blunt.        Speech: Speech normal.        Behavior: Behavior normal. Behavior is cooperative.        Thought Content: Thought content is delusional. Thought content does not include homicidal or suicidal ideation. Thought content does not include homicidal or suicidal plan.        Cognition and Memory: Cognition normal. He exhibits impaired recent memory (d/t med non-compliance and thc and etoh use).        Judgment: Judgment is impulsive.    Review of Systems  Constitutional: Negative.   HENT: Negative.    Eyes: Negative.   Respiratory: Negative.    Cardiovascular: Negative.   Gastrointestinal: Negative.   Genitourinary: Negative.   Musculoskeletal: Negative.   Skin: Negative.   Neurological:  Positive for tremors, sensory change, focal weakness, seizures, loss of consciousness and weakness.  Endo/Heme/Allergies: Negative.   Psychiatric/Behavioral:  Positive for hallucinations and substance abuse. The patient is nervous/anxious.    Blood pressure (!) 162/114, pulse 100, temperature 98.3 F (36.8 C), temperature source Oral, resp. rate 18, SpO2 97 %. There is no height or weight on file to calculate BMI.  Treatment Plan Summary: Pt with hx of schizoaffective disorder, presents mentally decompensated(AVH and delusional) since not taking psychotropic medications.  Pt presentation is complicated by polysubstance use, UDS is + for THC and BAL was 50. Pt is clear and coherent and able to appropriately engage in interview but is not at his baseline and his positive symptoms create safety concerns.  We discussed the need for inpatient admission to get him restarted on meds to promote mood stability.   Daily contact with patient to assess and evaluate symptoms and progress in treatment and Medication management.  Pending EKG can plan to restart home  meds: Seroquel 100mg  po qhs with plans to titrate for medication efficacy Gabapentin 300mg  po TID   Disposition: Recommend psychiatric Inpatient admission when medically cleared.  This service was provided via telemedicine using a 2-way, interactive audio and video technology.  Names of all persons participating in this telemedicine service and their role in this encounter. Name: Wilhelmenia Blase  Role: Patient  Name: Pete Glatter Role: Pt's Mother  Name: Duard Brady  Arvilla Market Role: PMHNP  Name: Nelly Rout Role: Psychiatrist    Chales Abrahams, NP 03/19/2022 12:35 PM

## 2022-03-19 NOTE — ED Notes (Addendum)
Pt refused EKG and states that he was told that he's gonna go home. Per pt, he was given a choice to be admitted vs go home, and he informed provider that he wants to go home and will pick up meds from his pharmacy.

## 2022-03-19 NOTE — Progress Notes (Addendum)
ADDENDUM  Pt tested positive for covid on 03/19/2022. Per [facility] AC Leonia Reader, RN, [facility] is at [facility] for [facility] rooms for quarantine. This CSW will follow up with daytime [facility] AC, Scharlene Gloss, RN, tomorrow 03/20/2022, for possible admission to singe room.   Pt was accepted to [facility] 03/19/2022, pending covid PCR, EKG, and signed voluntary consent faxed to [phone]. Bed assignment: 381-8  Pt meets inpatient criteria per Merlyn Lot, NP  Attending Physician will be Janine Limbo, MD  Report can be called to: - Adult unit: [phone]  Pt can arrive after pending items are received; [facility] AC to coordinate arrival time  Care Team Notified: [facility] Leonia Reader, RN, Merlyn Lot, NP, Rosemary Holms, RN, and Sena Hitch, RN  [employer], [place]  03/19/2022 12:37 PM

## 2022-03-19 NOTE — ED Provider Notes (Signed)
[facility] Provider Note   CSN: [id number] Arrival date & time: 03/19/22  4098     History  Chief Complaint  Patient presents with   Hallucinations    Alexander Pope is a 42 y.o. male.  The history is provided by the patient.  He has a history of hypertension, depression, schizoaffective disorder and states that he has been having hallucinations.  He actually has been having hallucinations for a long time, but tonight he stated he was not able to distinguish between what was hallucination what was real.  He generally will wake up about an hour after falling asleep and that is when he usually has a hallucinations.  Today, he states he saw monkey in the woods and could hear it as well.  He sometimes hears voices but denies command hallucinations.  He denies homicidal or suicidal ideation.  He does admit to having had 3 beers tonight, and to smoking some marijuana.  He denies other drug use.   Home Medications Prior to Admission medications   Medication Sig Start Date End Date Taking? Authorizing Provider  albuterol (VENTOLIN HFA) 108 (90 Base) MCG/ACT inhaler Inhale 2 puffs into the lungs every 4 (four) hours as needed for wheezing or shortness of breath. 01/25/22   Volney American, PA-C  ALPRAZolam Duanne Moron) 1 MG tablet Take 1 mg by mouth at bedtime as needed for anxiety.    [provider]  amoxicillin-clavulanate (AUGMENTIN) 875-125 MG tablet Take 1 tablet by mouth every 12 (twelve) hours. 01/25/22   Volney American, PA-C  amphetamine-dextroamphetamine (ADDERALL) 30 MG tablet Take 30 mg by mouth 2 (two) times daily.    [provider]  benzonatate (TESSALON PERLES) 100 MG capsule Take 1 capsule (100 mg total) by mouth 3 (three) times daily as needed for cough. 01/31/22   Leath-Warren, Alda Lea, NP  gabapentin (NEURONTIN) 300 MG capsule Take 1 capsule (300 mg total) by mouth 3 (three) times daily. 10/13/14   Rankin,  Shuvon B, NP  INVEGA SUSTENNA 234 MG/1.5ML SUSY injection Inject 234 mg into the muscle every 30 (thirty) days. 11/02/20   [provider]  lisinopril-hydrochlorothiazide (ZESTORETIC) 20-12.5 MG tablet Take 1 tablet by mouth daily. 07/05/19   Triplett, Tammy, PA-C  metoprolol tartrate (LOPRESSOR) 50 MG tablet Take 1 tablet (50 mg total) by mouth 2 (two) times daily. 07/05/19   Triplett, Tammy, PA-C  oxyCODONE-acetaminophen (PERCOCET) 5-325 MG tablet Take 1-2 tablets by mouth every 6 (six) hours as needed for severe pain. 10/10/21   Mesner, Corene Cornea, MD  oxyCODONE-acetaminophen (PERCOCET/ROXICET) 5-325 MG tablet Take 2 tablets by mouth every 8 (eight) hours as needed for severe pain. 10/10/21   Mesner, Corene Cornea, MD  promethazine-dextromethorphan (PROMETHAZINE-DM) 6.25-15 MG/5ML syrup Take 5 mLs by mouth 4 (four) times daily as needed. 01/25/22   Volney American, PA-C  QUEtiapine (SEROQUEL) 50 MG tablet Take 1-2 tablets by mouth at bedtime. 11/08/20   [provider]  hydrochlorothiazide (HYDRODIURIL) 25 MG tablet Take 1 tablet (25 mg total) by mouth daily. 10/22/15 07/05/19  Ward, Delice Bison, DO      Allergies    Patient has no known allergies.    Review of Systems   Review of Systems  All other systems reviewed and are negative.   Physical Exam Updated Vital Signs There were no vitals taken for this visit. Physical Exam Vitals and nursing note reviewed.   42 year old male, resting comfortably and in no acute  distress. Vital signs are significant for elevated heart rate and blood pressure. Oxygen saturation is 95%, which is normal. Head is normocephalic and atraumatic. PERRLA, EOMI. Oropharynx is clear. Neck is nontender and supple without adenopathy or JVD. Back is nontender and there is no CVA tenderness. Lungs are clear without rales, wheezes, or rhonchi. Chest is nontender. Heart has regular rate and rhythm without murmur. Abdomen is soft, flat, nontender. Extremities  have no cyanosis or edema, full range of motion is present. Skin is warm and dry without rash. Neurologic: Mental status is normal, cranial nerves are intact, moves all extremities equally. Psychiatric: Appears slightly anxious but does not appear to be responding to internal stimuli.  ED Results / Procedures / Treatments   Labs (all labs ordered are listed, but only abnormal results are displayed) Labs Reviewed - No data to display  Procedures Procedures    Medications Ordered in ED Medications - No data to display  ED Course/ Medical Decision Making/ A&P                             Medical Decision Making  Worsening of baseline hallucinations.  This does not appear to be an acute psychosis.  Symptoms may be exacerbated by psychoactive substances such as alcohol and THC.  I have ordered TTS consultation.  Final Clinical Impression(s) / ED Diagnoses Final diagnoses:  Hallucinations  Elevated blood pressure reading with diagnosis of hypertension    Rx / DC Orders ED Discharge Orders     None         Delora Fuel, MD 41/96/22 [phone]

## 2022-03-19 NOTE — ED Notes (Signed)
Pt changed into scrubs, belongings placed in bag & secured in labeled locker.

## 2022-03-20 ENCOUNTER — Other Ambulatory Visit: Payer: Self-pay

## 2022-03-20 ENCOUNTER — Encounter (HOSPITAL_COMMUNITY): Payer: Self-pay | Admitting: Psychiatry

## 2022-03-20 ENCOUNTER — Inpatient Hospital Stay (HOSPITAL_COMMUNITY)
Admission: AD | Admit: 2022-03-20 | Discharge: 2022-03-23 | DRG: 885 | Disposition: A | Discharge status: Home / Self Care | Payer: No Typology Code available for payment source | Source: Intra-hospital | Attending: Psychiatry | Admitting: Psychiatry

## 2022-03-20 DIAGNOSIS — R440 Auditory hallucinations: Secondary | ICD-10-CM | POA: Diagnosis present

## 2022-03-20 DIAGNOSIS — I10 Essential (primary) hypertension: Secondary | ICD-10-CM | POA: Diagnosis present

## 2022-03-20 DIAGNOSIS — U071 COVID-19: Secondary | ICD-10-CM | POA: Diagnosis present

## 2022-03-20 DIAGNOSIS — F10239 Alcohol dependence with withdrawal, unspecified: Secondary | ICD-10-CM | POA: Diagnosis present

## 2022-03-20 DIAGNOSIS — G4700 Insomnia, unspecified: Secondary | ICD-10-CM | POA: Diagnosis present

## 2022-03-20 DIAGNOSIS — K30 Functional dyspepsia: Secondary | ICD-10-CM | POA: Diagnosis present

## 2022-03-20 DIAGNOSIS — F17210 Nicotine dependence, cigarettes, uncomplicated: Secondary | ICD-10-CM | POA: Diagnosis present

## 2022-03-20 DIAGNOSIS — Y902 Blood alcohol level of 40-59 mg/100 ml: Secondary | ICD-10-CM | POA: Diagnosis present

## 2022-03-20 DIAGNOSIS — J45909 Unspecified asthma, uncomplicated: Secondary | ICD-10-CM | POA: Diagnosis present

## 2022-03-20 DIAGNOSIS — Z720 Tobacco use: Secondary | ICD-10-CM | POA: Diagnosis present

## 2022-03-20 DIAGNOSIS — F250 Schizoaffective disorder, bipolar type: Secondary | ICD-10-CM | POA: Diagnosis present

## 2022-03-20 DIAGNOSIS — F411 Generalized anxiety disorder: Secondary | ICD-10-CM | POA: Diagnosis present

## 2022-03-20 DIAGNOSIS — F259 Schizoaffective disorder, unspecified: Secondary | ICD-10-CM | POA: Diagnosis not present

## 2022-03-20 DIAGNOSIS — Z91148 Patient's other noncompliance with medication regimen for other reason: Secondary | ICD-10-CM | POA: Diagnosis not present

## 2022-03-20 DIAGNOSIS — F1999 Other psychoactive substance use, unspecified with unspecified psychoactive substance-induced disorder: Secondary | ICD-10-CM | POA: Diagnosis present

## 2022-03-20 DIAGNOSIS — F1220 Cannabis dependence, uncomplicated: Secondary | ICD-10-CM | POA: Diagnosis present

## 2022-03-20 MED ORDER — ACETAMINOPHEN 325 MG PO TABS: 650.0000 mg | ORAL_TABLET | ORAL | Status: DC | PRN

## 2022-03-20 MED ORDER — ALUM & MAG HYDROXIDE-SIMETH 200-200-20 MG/5ML PO SUSP: 30.0000 mL | ORAL | Status: DC | PRN

## 2022-03-20 MED ORDER — ZIPRASIDONE MESYLATE 20 MG IM SOLR: 20.0000 mg | Freq: Two times a day (BID) | INTRAMUSCULAR | Status: DC | PRN

## 2022-03-20 MED ORDER — NICOTINE 14 MG/24HR TD PT24
14.0000 mg | MEDICATED_PATCH | Freq: Every day | TRANSDERMAL | Status: DC
  Administered 2022-03-22: 14 mg via TRANSDERMAL
  Filled 2022-03-20 (×7): qty 1

## 2022-03-20 MED ORDER — INFLUENZA VAC SPLIT QUAD 0.5 ML IM SUSY
0.5000 mL | PREFILLED_SYRINGE | INTRAMUSCULAR | Status: DC
  Filled 2022-03-20: qty 0.5

## 2022-03-20 MED ORDER — LORAZEPAM 2 MG/ML IJ SOLN: 1.0000 mg | Freq: Once | INTRAMUSCULAR | Status: DC

## 2022-03-20 MED ORDER — GABAPENTIN 300 MG PO CAPS
300.0000 mg | ORAL_CAPSULE | Freq: Three times a day (TID) | ORAL | Status: DC
  Administered 2022-03-20 – 2022-03-23 (×7): 300 mg via ORAL
  Filled 2022-03-20 (×19): qty 1

## 2022-03-20 MED ORDER — METOPROLOL TARTRATE 50 MG PO TABS
50.0000 mg | ORAL_TABLET | Freq: Two times a day (BID) | ORAL | Status: DC
  Administered 2022-03-20 – 2022-03-23 (×4): 50 mg via ORAL
  Filled 2022-03-20 (×12): qty 1

## 2022-03-20 MED ORDER — ALBUTEROL SULFATE HFA 108 (90 BASE) MCG/ACT IN AERS: 2.0000 | INHALATION_SPRAY | RESPIRATORY_TRACT | Status: DC | PRN

## 2022-03-20 NOTE — Tx Team (Signed)
Initial Treatment Plan 03/20/2022 8:20 PM Alexander Pope [id number]    PATIENT STRESSORS: Financial difficulties   Medication change or noncompliance   Occupational concerns   Substance abuse     PATIENT STRENGTHS: Capable of independent living  Communication skills  Supportive family/friends    PATIENT IDENTIFIED PROBLEMS: Substance Abuse "Cocaine, marijuana"    Financial strain "I don't have no job, I have no money right money right now".    Medication noncompliance "I lost my insurance 4 months ago, I couldn't afford my medications".             DISCHARGE CRITERIA:  Improved stabilization in mood, thinking, and/or behavior Verbal commitment to aftercare and medication compliance Withdrawal symptoms are absent or subacute and managed without 24-hour nursing intervention  PRELIMINARY DISCHARGE PLAN: Outpatient therapy Return to previous living arrangement  PATIENT/FAMILY INVOLVEMENT: This treatment plan has been presented to and reviewed with the patient, Alexander Pope..  The patient have been given the opportunity to ask questions and make suggestions.  Keane Police, RN 03/20/2022, 8:20 PM

## 2022-03-20 NOTE — Progress Notes (Signed)
Pt was accepted to [facility] 03/20/2022, pending EKG. Bed assignment: 406-1  Pt meets inpatient criteria per Merlyn Lot, NP  Attending Physician will be Janine Limbo, MD  Report can be called to: - Adult unit: [phone]  Pt can arrive after pending items are received; [facility] AC to coordinate arrival time  Care Team Notified: [facility] Scharlene Gloss, RN, Demetrius Revel, RN, Rosemary Holms, RN, and Adalberto Ill, RN  [place], [place]  03/20/2022 8:50 AM

## 2022-03-20 NOTE — Progress Notes (Signed)
Pt admitted to [facility] from [place] to [facility] under voluntary status in the context of acute psychosis with AVH related to medication noncompliance. Presents A & O to self, place and situation. Per pt "I've been off my medications for 4 months because I lost my insurance. I last took my [place] 4 months ago along with my Gabapentin and Effexor. I don't have a job right now. My son and I live with my mom". Denies SI, HI, AVH and pain when assessed. Per pt "My hallucinations are mostly at night. I see shadow figures, I hear voices telling me to go join the line for breakfast. I had a vivid dream of a beast chasing my son and I, it was real so I told my mom to take me to the hospital. Cooperative with skin assessment, corns noted under bilateral feet. Belongings searched, items deemed contraband secured in locker. Pt ambulatory to unit with steady gait. Unit orientation done, routines discussed, care plan reviewed with pt and admission documents signed. Pt verbalized understanding, tolerated meals, fluids and medications well. Safety checks initiated at Q 15 minutes intervals without incident thus far.

## 2022-03-20 NOTE — ED Notes (Signed)
Pt's breakfast has arrived, pt sitting up and eating his breakfast.

## 2022-03-20 NOTE — Group Note (Signed)
LCSW Group Therapy Note   Group Date: 03/20/2022 Start Time: 1100 End Time: 1200  Type of Therapy: Group Therapy: Boundaries  Participation Level: Did Not Attend  Description of Group: This group will address the use of boundaries in their personal lives. Patients will explore why boundaries are important, the difference between healthy and unhealthy boundaries, and negative and positive outcomes of different boundaries and will look at how boundaries can be crossed.  Patients will be encouraged to identify current boundaries in their own lives and identify what kind of boundary is being set. Facilitators will guide patients in utilizing problem-solving interventions to address and correct types of boundaries being used and to address when no boundary is being used. Understanding and applying boundaries will be explored and addressed for obtaining and maintaining a balanced life. Patients will be encouraged to explore ways to assertively make their boundaries and needs known to significant others in their lives, using other group members and facilitator for role play, support, and feedback.   Therapeutic Goals: 1. Patient will identify areas in their life where setting clear boundaries could be used to improve their life.  2. Patient will identify signs/triggers that a boundary is not being respected. 3. Patient will identify two ways to set boundaries in order to achieve balance in their lives: 4. Patient will demonstrate ability to communicate their needs and set boundaries through discussion and/or role plays   Summary of Progress/Problems: Did not attend   Darleen Crocker, [place] 03/20/2022  1:22 PM

## 2022-03-20 NOTE — Progress Notes (Addendum)
Patient irritable, angry, and guarded. Pt removed name card from the door. Pt yells get out during 15 minute checks and escalates to yelling "fuck you leave me alone, go away and get out" over and over again during routine 15 minute checks. Safety checks continue and nurse notified.

## 2022-03-20 NOTE — ED Notes (Signed)
Report given to [place] at [facility]. Safe transport here to transport patient at this time.

## 2022-03-20 NOTE — Progress Notes (Signed)
Patient noted very agitated when this nurse entered the room, yelling get out, get the F out. Refusing care and meds. Informed charge nurse and [facility] of patient behavior. Informed [place] NP of patient behavior and requested Agitation Protocol meds.

## 2022-03-20 NOTE — BHH Group Notes (Signed)
Pt did not attend wrap-up group   

## 2022-03-20 NOTE — ED Notes (Signed)
Pt came out of room confused. He was easily redirected back to bed without issue. Alexander Pope

## 2022-03-21 ENCOUNTER — Encounter (HOSPITAL_COMMUNITY): Payer: Self-pay

## 2022-03-21 ENCOUNTER — Other Ambulatory Visit (HOSPITAL_COMMUNITY): Payer: Self-pay

## 2022-03-21 DIAGNOSIS — J45909 Unspecified asthma, uncomplicated: Secondary | ICD-10-CM | POA: Diagnosis present

## 2022-03-21 DIAGNOSIS — F411 Generalized anxiety disorder: Secondary | ICD-10-CM | POA: Diagnosis present

## 2022-03-21 DIAGNOSIS — F1220 Cannabis dependence, uncomplicated: Secondary | ICD-10-CM | POA: Diagnosis present

## 2022-03-21 DIAGNOSIS — F250 Schizoaffective disorder, bipolar type: Secondary | ICD-10-CM

## 2022-03-21 DIAGNOSIS — G4700 Insomnia, unspecified: Secondary | ICD-10-CM | POA: Diagnosis present

## 2022-03-21 MED ORDER — DIVALPROEX SODIUM ER 500 MG PO TB24
1500.0000 mg | ORAL_TABLET | Freq: Every day | ORAL | Status: DC
  Administered 2022-03-21: 1500 mg via ORAL
  Filled 2022-03-21 (×4): qty 3

## 2022-03-21 MED ORDER — DIPHENHYDRAMINE HCL 50 MG/ML IJ SOLN: 50.0000 mg | Freq: Three times a day (TID) | INTRAMUSCULAR | Status: DC | PRN

## 2022-03-21 MED ORDER — HALOPERIDOL LACTATE 5 MG/ML IJ SOLN: 5.0000 mg | Freq: Three times a day (TID) | INTRAMUSCULAR | Status: DC | PRN

## 2022-03-21 MED ORDER — TRAZODONE HCL 50 MG PO TABS
50.0000 mg | ORAL_TABLET | Freq: Every evening | ORAL | Status: DC | PRN
  Administered 2022-03-21: 50 mg via ORAL
  Filled 2022-03-21 (×3): qty 1

## 2022-03-21 MED ORDER — LORAZEPAM 1 MG PO TABS: 2.0000 mg | ORAL_TABLET | Freq: Three times a day (TID) | ORAL | Status: DC | PRN

## 2022-03-21 MED ORDER — CLONAZEPAM 0.5 MG PO TABS
0.5000 mg | ORAL_TABLET | Freq: Two times a day (BID) | ORAL | Status: DC
  Administered 2022-03-21 – 2022-03-23 (×3): 0.5 mg via ORAL
  Filled 2022-03-21 (×5): qty 1

## 2022-03-21 MED ORDER — HALOPERIDOL 5 MG PO TABS
5.0000 mg | ORAL_TABLET | Freq: Three times a day (TID) | ORAL | Status: DC | PRN
  Filled 2022-03-21: qty 1

## 2022-03-21 MED ORDER — LORAZEPAM 2 MG/ML IJ SOLN: 2.0000 mg | Freq: Three times a day (TID) | INTRAMUSCULAR | Status: DC | PRN

## 2022-03-21 MED ORDER — QUETIAPINE FUMARATE 200 MG PO TABS
200.0000 mg | ORAL_TABLET | Freq: Every day | ORAL | Status: DC
  Administered 2022-03-21: 200 mg via ORAL
  Filled 2022-03-21 (×5): qty 1

## 2022-03-21 MED ORDER — DIPHENHYDRAMINE HCL 25 MG PO CAPS
50.0000 mg | ORAL_CAPSULE | Freq: Three times a day (TID) | ORAL | Status: DC | PRN
  Filled 2022-03-21: qty 2

## 2022-03-21 MED ORDER — PALIPERIDONE ER 6 MG PO TB24
6.0000 mg | ORAL_TABLET | Freq: Every day | ORAL | Status: DC
  Administered 2022-03-21 – 2022-03-23 (×3): 6 mg via ORAL
  Filled 2022-03-21 (×6): qty 1

## 2022-03-21 MED ORDER — PALIPERIDONE PALMITATE ER 234 MG/1.5ML IM SUSY
234.0000 mg | PREFILLED_SYRINGE | INTRAMUSCULAR | Status: DC
  Administered 2022-03-21: 234 mg via INTRAMUSCULAR

## 2022-03-21 MED ORDER — INVEGA SUSTENNA 234 MG/1.5ML IM SUSY
PREFILLED_SYRINGE | INTRAMUSCULAR | 0 refills | Status: DC
  Filled 2022-03-21: qty 1.5, 30d supply, fill #0

## 2022-03-21 NOTE — Progress Notes (Signed)
   03/21/22 1320  Psych Admission Type (Psych Patients Only)  Admission Status Voluntary  Psychosocial Assessment  Patient Complaints Anxiety;Depression  Eye Contact Avertive  Facial Expression Angry;Flat  Affect Depressed;Angry;Anxious;Irritable;Labile  Speech Argumentative;Pressured;Loud  Interaction Assertive;Defensive;Demanding;Forwards little  Motor Activity Fidgety;Restless  Appearance/Hygiene Disheveled;Body odor;In scrubs  Behavior Characteristics Unwilling to participate;Agitated;Anxious  Mood Anxious;Depressed;Irritable  Aggressive Behavior  Effect No apparent injury  Thought Process  Coherency Disorganized  Content Paranoia  Delusions None reported or observed  Perception Hallucinations  Hallucination Auditory;Visual  Judgment Poor  Confusion None  Danger to Self  Current suicidal ideation? Denies  Danger to Others  Danger to Others None reported or observed

## 2022-03-21 NOTE — Progress Notes (Signed)
Pt stated he was moved to this side for yelling at staff last night, pt educated on we just want to keep him safe    03/21/22 2130  Psych Admission Type (Psych Patients Only)  Admission Status Voluntary  Psychosocial Assessment  Patient Complaints Anxiety;Depression  Eye Contact Fair  Facial Expression Flat  Affect Depressed  Speech Slow  Interaction Assertive  Motor Activity Slow  Appearance/Hygiene Disheveled  Behavior Characteristics Unable to participate  Mood Depressed  Aggressive Behavior  Effect No apparent injury  Thought Process  Coherency Circumstantial  Content Blaming self  Delusions None reported or observed  Perception Hallucinations  Hallucination Auditory;Visual  Judgment Poor  Confusion None  Danger to Self  Current suicidal ideation? Denies  Danger to Others  Danger to Others None reported or observed

## 2022-03-21 NOTE — Progress Notes (Signed)
Pt was offered a breakfast tray during 15 minute safety rounds. Pt yelled at writer get out, go away. Writer asked again if he would like breakfast, pt replied, NO, Go Away.  Pt angry, agitated, and verbally aggressive every check he was awake.
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
## 2022-03-21 NOTE — H&P (Signed)
Psychiatric Admission Assessment Adult  Patient Identification: Alexander Pope MRN:  [id number] Date of Evaluation:  03/21/2022 Chief Complaint:  Schizoaffective disorder ([place]) [F25.9] Principal Diagnosis: Schizoaffective disorder, bipolar type ([place]) Diagnosis:  Principal Problem:   Schizoaffective disorder, bipolar type ([place]) Active Problems:   HTN (hypertension)   Tobacco abuse   Delta-9-tetrahydrocannabinol (THC) dependence ([place])   Insomnia   Asthma   GAD (generalized anxiety disorder)  [id number]  Reason for admission: Alexander Pope is a 42 yo Caucasian male with a mental health history of schizoaffective disorder bipolar type, who presented to the [facility] ER on 2/7 with complaints of auditory and visual hallucinations of monkeys in the woods in the context of medication noncompliance.  Patient was transferred to this behavioral health Hospital for treatment and stabilization of his mood.  Mode of transport to Hospital: Safe transport  Current Outpatient (Home) Medication List: Invega, gabapentin, Seroquel, Strattera, Klonopin, lisinopril/hydrochlorothiazide, metoprolol, but noncompliant with medications prior to admission.  Stated he had not taken medications for "a couple of months". PRN medication prior to evaluation: Tylenol, Milk of Magnesia, Maalox, hydroxyzine.  ED course: Uneventful Collateral Information: None POA/Legal Guardian: Patient is his own guardian  HPI: Patient seen initially by writer on and was not receptive to answering assessment questions.  He however provided limited information, and stated that he was having auditory and visual hallucinations prior to admission, and stated that people were actively trying to harm him, and also stated that he was seeing shapes and figures.  He stated that everyone was lying to him, and mood was dysphoric, angry, and irritable.  Writer informed him that she would return with attending psychiatrist.  Alexander Pope and attending  psychiatrist saw patient for a second time.  During second assessment of patient with attending psychiatrist, patient was more receptive to talking, but again was irritable, but not as prior.  Patient reported that he had not been taking his medications for "a couple of months".  He reported that the medications consisted of gabapentin, Effexor, Invega.  He reported not being sure of the dosages.  He was able to collaborate his diagnosis of schizoaffective disorder, bipolar type, verbalized being depressed, having insomnia prior to admission, but denied SI, and denied HI.  He again stated to attending psychiatrist that he was having AVH prior to admission and currently.  Patient was agreeable to restarting his medications, inquired about getting Klonopin for anxiety, also inquired about getting Seroquel for sleep, asked for 300 mg nightly for Seroquel.  Attending psychiatrist agreeable to prescribing 200 mg nightly of Seroquel and 0.5 mg of Klonopin daily as needed.  Patient also agreeable to restarting Invega, along with getting the first dose of [place] injection today 03/21/2022.  Patient also agreeable to starting Depakote for stabilization of his mood and management of depressive symptoms.  Past Psychiatric Hx: Previous Psych Diagnoses: Schizoaffective d/o, bipolar type Prior inpatient treatment: 10/09/2014 at this [facility] as per chart review.   Current/prior outpatient treatment:none  Prior rehab hx: Denies  Psychotherapy hx: Denies  History of suicide attempts: Denies  History of homicide or aggression: Denies  Psychiatric medication history: Denies  Psychiatric medication compliance history: Denies  Neuromodulation history: none  Current Psychiatrist: Denies  Current therapist: Denies   Substance Abuse Hx: Alcohol: One beers per week as per patient  Tobacco: denies  Illicit drugs: THC  Rx drug abuse: denies  Rehab [id number]   Past Medical History: Medical Diagnoses: Denies   Home [id number]  Prior Hosp: Prior  Surgeries/Trauma: Denies  Head trauma, LOC, concussions, seizures: Denies Allergies:Denies  LMP:n/a  Family History: Medical:denies  Psych:UTA-unable to obtain d/t agitation Psych Rx:UTA-unable to obtain d/t agitation SA/HA: UTA Substance use family hx: UTA  Social History: UTA  Current Presentation: Pt with depressed mood, angry, irritable, dysphoric mood.  His attention to personal hygiene and grooming is poor, he appears disheveled, eye contact is fair, speech is clear & coherent. Thought contents are organized and logical, and pt currently denies SI/HI.  He presents with AVH.  Insight and judgment are poor, concentration is poor.  Associated Signs/Symptoms: Depression Symptoms:  depressed mood, anhedonia, fatigue, feelings of worthlessness/guilt, difficulty concentrating, hopelessness, anxiety, loss of energy/fatigue, disturbed sleep, decreased appetite, (Hypo) Manic Symptoms:  Irritable Mood, Anxiety Symptoms:  Excessive Worry, Psychotic Symptoms:  Hallucinations: Auditory Visual Paranoia, PTSD Symptoms: NA Total Time spent with patient: 1.5 hours  Past Psychiatric History: Schizoaffective d/o  Is the patient at risk to self? Yes.    Has the patient been a risk to self in the past 6 months? Yes.    Has the patient been a risk to self within the distant past? Yes.    Is the patient a risk to others? No.  Has the patient been a risk to others in the past 6 months? No.  Has the patient been a risk to others within the distant past? No.   [place] Scale:  [place] Admission (Current) from 03/20/2022 in [facility] ED from 03/19/2022 in [facility] Emergency Department at [facility] ED from 01/31/2022 in [facility] No Risk No Risk No Risk       Alcohol Screening: 1. How often do you have a drink containing alcohol?: 2 to 3 times a  week 2. How many drinks containing alcohol do you have on a typical day when you are drinking?: 3 or 4 3. How often do you have six or more drinks on one occasion?: Less than monthly AUDIT-C Score: 5 4. How often during the last year have you found that you were not able to stop drinking once you had started?: Never 5. How often during the last year have you failed to do what was normally expected from you because of drinking?: Monthly 6. How often during the last year have you needed a first drink in the morning to get yourself going after a heavy drinking session?: Never 7. How often during the last year have you had a feeling of guilt of remorse after drinking?: Monthly 8. How often during the last year have you been unable to remember what happened the night before because you had been drinking?: Never 9. Have you or someone else been injured as a result of your drinking?: No 10. Has a relative or friend or a doctor or another health worker been concerned about your drinking or suggested you cut down?: No Alcohol Use Disorder Identification Test Final Score (AUDIT): 9 Alcohol Brief Interventions/Follow-up: Alcohol education/Brief advice Substance Abuse History in the last 12 months:  Yes.   Consequences of Substance Abuse: Medical Consequences:  Psychosis Previous Psychotropic Medications: Yes  Psychological Evaluations: No  Past Medical History:  Past Medical History:  Diagnosis Date   Anxiety    Back pain    Depression    Hypertension    Schizoaffective disorder ([place])     Past Surgical History:  Procedure Laterality Date   BACK SURGERY     Family  History:  Family History  Problem Relation Age of Onset   Stroke Mother    Family Psychiatric  History: None Tobacco Screening:  Social History   Tobacco Use  Smoking Status Every Day   Packs/day: 1.00   Types: Cigarettes  Smokeless Tobacco Never    BH Tobacco Counseling     Are you interested in Tobacco Cessation  Medications?  No value filed. Counseled patient on smoking cessation:  No value filed. Reason Tobacco Screening Not Completed: No value filed.       Social History:  Social History   Substance and Sexual Activity  Alcohol Use Yes   Alcohol/week: 6.0 standard drinks of alcohol   Types: 6 Cans of beer per week   Comment: occasionally      Social History   Substance and Sexual Activity  Drug Use Not Currently   Types: Cocaine, Benzodiazepines   Comment: "Molly"   Allergies:  No Known Allergies Lab Results: No results found for this or any previous visit (from the past 66 hour(s)).  Blood Alcohol level:  Lab Results  Component Value Date   ETH 50 (H) 03/19/2022   ETH <10 [id number]    Metabolic Disorder Labs:  No results found for: "HGBA1C", "MPG" No results found for: "PROLACTIN" Lab Results  Component Value Date   CHOL 235 (H) 10/10/2014   TRIG 159 (H) 10/10/2014   HDL 50 10/10/2014   CHOLHDL 4.7 10/10/2014   VLDL 32 10/10/2014   LDLCALC 153 (H) 10/10/2014   Current Medications: Current Facility-Administered Medications  Medication Dose Route Frequency Provider Last Rate Last Admin   acetaminophen (TYLENOL) tablet 650 mg  650 mg Oral Q4H PRN Massengill, Ovid Curd, MD       albuterol (VENTOLIN HFA) 108 (90 Base) MCG/ACT inhaler 2 puff  2 puff Inhalation Q4H PRN Massengill, Ovid Curd, MD       alum & mag hydroxide-simeth (MAALOX/MYLANTA) 200-200-20 MG/5ML suspension 30 mL  30 mL Oral Q2H PRN Massengill, Ovid Curd, MD       clonazePAM Bobbye [place]) tablet 0.5 mg  0.5 mg Oral Q12H Massengill, Ovid Curd, MD   0.5 mg at 03/21/22 1523   haloperidol (HALDOL) tablet 5 mg  5 mg Oral TID PRN Janine Limbo, MD       And   LORazepam (ATIVAN) tablet 2 mg  2 mg Oral TID PRN Janine Limbo, MD       And   diphenhydrAMINE (BENADRYL) capsule 50 mg  50 mg Oral TID PRN Massengill, Ovid Curd, MD       haloperidol lactate (HALDOL) injection 5 mg  5 mg Intramuscular TID PRN Massengill, Ovid Curd,  MD       And   LORazepam (ATIVAN) injection 2 mg  2 mg Intramuscular TID PRN Massengill, Ovid Curd, MD       And   diphenhydrAMINE (BENADRYL) injection 50 mg  50 mg Intramuscular TID PRN Massengill, Ovid Curd, MD       divalproex (DEPAKOTE ER) 24 hr tablet 1,500 mg  1,500 mg Oral QHS Massengill, Nathan, MD       gabapentin (NEURONTIN) capsule 300 mg  300 mg Oral TID Janine Limbo, MD   300 mg at 03/21/22 1706   influenza vac split quadrivalent PF (FLUARIX) injection 0.5 mL  0.5 mL Intramuscular Tomorrow-1000 Massengill, Nathan, MD       LORazepam (ATIVAN) injection 1 mg  1 mg Intramuscular Once Onuoha, Chinwendu V, NP       metoprolol tartrate (LOPRESSOR) tablet 50 mg  50 mg Oral  BID Janine Limbo, MD   50 mg at 03/21/22 1707   nicotine (NICODERM CQ - dosed in mg/24 hours) patch 14 mg  14 mg Transdermal Daily Massengill, Ovid Curd, MD       paliperidone (INVEGA SUSTENNA) injection 234 mg  234 mg Intramuscular Q28 days Janine Limbo, MD   234 mg at 03/21/22 1533   paliperidone (INVEGA) 24 hr tablet 6 mg  6 mg Oral Daily Massengill, Ovid Curd, MD   6 mg at 03/21/22 1527   QUEtiapine (SEROQUEL) tablet 200 mg  200 mg Oral QHS Massengill, Ovid Curd, MD       traZODone (DESYREL) tablet 50 mg  50 mg Oral QHS PRN Massengill, Ovid Curd, MD       PTA Medications: Medications Prior to Admission  Medication Sig Dispense Refill Last Dose   albuterol (VENTOLIN HFA) 108 (90 Base) MCG/ACT inhaler Inhale 2 puffs into the lungs every 4 (four) hours as needed for wheezing or shortness of breath. (Patient not taking: Reported on 03/19/2022) 18 g 0    atomoxetine (STRATTERA) 40 MG capsule Take 80 mg by mouth daily.      clonazePAM (KLONOPIN) 1 MG tablet Take 1 mg by mouth daily as needed for anxiety.      gabapentin (NEURONTIN) 300 MG capsule Take 1 capsule (300 mg total) by mouth 3 (three) times daily. 90 capsule 0    INVEGA SUSTENNA 234 MG/1.5ML SUSY injection Inject 234 mg into the muscle every 30 (thirty) days.  (Patient not taking: Reported on 03/19/2022)      lisinopril-hydrochlorothiazide (ZESTORETIC) 20-12.5 MG tablet Take 1 tablet by mouth daily. (Patient not taking: Reported on 03/19/2022) 30 tablet 0    metoprolol tartrate (LOPRESSOR) 50 MG tablet Take 1 tablet (50 mg total) by mouth 2 (two) times daily. (Patient not taking: Reported on 03/19/2022) 60 tablet 0    QUEtiapine (SEROQUEL) 50 MG tablet Take 300 mg by mouth at bedtime.      Musculoskeletal: Strength & Muscle Tone: within normal limits Gait & Station: normal Patient leans: N/A Psychiatric Specialty Exam:  Presentation  General Appearance:  Disheveled  Eye Contact: Fair  Speech: Clear and Coherent  Speech Volume: Increased  Handedness: Right   Mood and Affect  Mood: Anxious; Depressed; Dysphoric; Irritable; Angry  Affect: Congruent   Thought Process  Thought Processes: Coherent  Duration of Psychotic Symptoms:N/A Past Diagnosis of Schizophrenia or Psychoactive disorder: Yes  Descriptions of Associations:Intact  Orientation:Partial  Thought Content:Illogical  Hallucinations:Hallucinations: Auditory; Visual  Ideas of Reference:Paranoia  Suicidal Thoughts:Suicidal Thoughts: No  Homicidal Thoughts:Homicidal Thoughts: No   Sensorium  Memory: Immediate Good  Judgment: Poor  Insight: Poor   Executive Functions  Concentration: Poor  Attention Span: Poor  Recall: [place] of Knowledge: Fair  Language: Fair   Psychomotor Activity  Psychomotor Activity:Psychomotor Activity: Normal   Assets  Assets: Resilience   Sleep  Sleep:Sleep: Poor    Physical Exam: Physical Exam Constitutional:      Appearance: Normal appearance.  HENT:     Head: Normocephalic.     Nose: Nose normal. No congestion.  Pulmonary:     Effort: Pulmonary effort is normal.  Musculoskeletal:        General: Normal range of motion.  Neurological:     General: No focal deficit present.     Mental  Status: He is alert and oriented to person, place, and time.    Review of Systems  Constitutional: Negative.   HENT: Negative.    Eyes: Negative.  Respiratory: Negative.    Cardiovascular: Negative.   Gastrointestinal: Negative.   Genitourinary: Negative.   Musculoskeletal: Negative.   Skin: Negative.   Neurological: Negative.   Psychiatric/Behavioral:  Positive for depression, hallucinations and substance abuse. Negative for memory loss and suicidal ideas. The patient is nervous/anxious and has insomnia.    Blood pressure (!) 120/96, pulse 100, temperature 98.6 F (37 C), temperature source Oral, resp. rate 18, height 6' (1.829 m), weight 108 kg, SpO2 96 %. Body mass index is 32.28 kg/m.  Treatment Plan Summary: Daily contact with patient to assess and evaluate symptoms and progress in treatment and Medication management  Observation Level/Precautions:  15 minute checks  Laboratory:  Labs reviewed   Psychotherapy:  Unit Group sessions  Medications:  See [facility]  Consultations:  To be determined   Discharge Concerns:  Safety, medication compliance, mood stability  Estimated LOS: 5-7 days  Other:  N/A   Labs reviewed on 03/21/2022: Urine drug screen positive for THC.  CBC WNL.  CMP WNL.  Orders placed for TSH, hemoglobin A1c, lipid panel, vitamins D, B1, B12.  Baseline UA ordered.  EKG with QTc of 448.   PLAN Safety and Monitoring: Voluntary admission to inpatient psychiatric unit for safety, stabilization and treatment Daily contact with patient to assess and evaluate symptoms and progress in treatment Patient's case to be discussed in multi-disciplinary team meeting Observation Level : q15 minute checks Vital signs: q12 hours Precautions: Safety   Long Term Goal(s): Improvement in symptoms so as ready for discharge  Short Term Goals: Ability to identify changes in lifestyle to reduce recurrence of condition will improve, Ability to verbalize feelings will improve, Ability to  disclose and discuss suicidal ideas, Ability to demonstrate self-control will improve, Ability to identify and develop effective coping behaviors will improve, Ability to maintain clinical measurements within normal limits will improve, Compliance with prescribed medications will improve, and Ability to identify triggers associated with substance abuse/mental health issues will improve  Diagnoses:  Principal Problem:   Schizoaffective disorder, bipolar type ([place]) Active Problems:   HTN (hypertension)   Tobacco abuse   Delta-9-tetrahydrocannabinol (THC) dependence ([facility])   Insomnia   Asthma   GAD (generalized anxiety disorder)  Medications -Start Depakote 1500 mg nightly at bedtime for mood stabilization -Start Klonopin 0.5 mg twice daily for anxiety -Start gabapentin 300 mg 3 times daily for anxiety -Start Invega 6 mg daily for psychosis -Start Seroquel 200 mg nightly at bedtime for sleep/mood stabilization -Start Lopressor 50 mg twice daily for hypertension -Start albuterol inhaler as needed for shortness of breath/wheezing -Start agitation protocol as needed: Haldol/Ativan/Benadryl please see the MAR for details -Give Invega [place] 234 mg x 1 dose today 03/21/2022 for psychosis  Patient has been educated on rationales, possible side effects, and benefits of all medications listed above, verbalizes understanding, and agreeable to restarting all of his home medications listed above and all new medications listed above.  Other PRNS -Continue Tylenol 650 mg every 6 hours PRN for mild pain -Continue Maalox 30 mg every 4 hrs PRN for indigestion -Continue Imodium 2-4 mg as needed for diarrhea -Continue Milk of Magnesia as needed every 6 hrs for constipation -Continue Zofran disintegrating tabs every 6 hrs PRN for nausea   Discharge Planning: Social work and case management to assist with discharge planning and identification of hospital follow-up needs prior to discharge Estimated LOS:  5-7 days Discharge Concerns: Need to establish a safety plan; Medication compliance and effectiveness Discharge Goals: Return home with outpatient  referrals for mental health follow-up including medication management/psychotherapy  I certify that inpatient services furnished can reasonably be expected to improve the patient's condition.    Nicholes Rough, [place] 2/9/20246:11 PM

## 2022-03-21 NOTE — Progress Notes (Signed)
Adult Psychoeducational Group Note  Date:  03/21/2022 Time:  9:03 PM  Group Topic/Focus:  Wrap-Up Group:   The focus of this group is to help patients review their daily goal of treatment and discuss progress on daily workbooks.  Participation Level:  Did Not Attend  Participation Quality:   Did Not Attend  Affect:   Did Not Attend  Cognitive:   Did Not Attend  Insight: None  Engagement in Group:   Did Not Attend  Modes of Intervention:   Did Not Attend  Additional Comments:  Pt did not attend the evening group due to isolation restriction.  Wende Crease 03/21/2022, 9:03 PM

## 2022-03-21 NOTE — Progress Notes (Signed)
   03/21/22 0100  Psych Admission Type (Psych Patients Only)  Admission Status Voluntary  Psychosocial Assessment  Patient Complaints Anxiety;Depression  Eye Contact Avertive  Facial Expression Angry  Affect Angry;Blunted;Irritable;Labile  Speech Argumentative;Pressured;Loud;Aggressive  Interaction Assertive;Defensive;Demanding;Dominating  Motor Activity Fidgety  Appearance/Hygiene Disheveled;Poor hygiene  Behavior Characteristics Agressive verbally;Agitated;Irritable  Mood Anxious;Irritable;Threatening  Thought Process  Coherency Disorganized  Content Paranoia  Delusions Other (Comment)  Perception UTA  Hallucination UTA  Judgment Poor  Confusion UTA  Danger to Self  Current suicidal ideation? Denies  Danger to Others  Danger to Others None reported or observed   Alert/oriented. Resting in bed currently. Verbally abusive towards staff/ yelling at staff when they enter the room for safety checks. Will continue to monitor. Refused pm care and meds.  Will continue to monitor.

## 2022-03-21 NOTE — Group Note (Deleted)
Recreation Therapy Group Note   Group Topic:Problem Solving  Group Date: 03/21/2022 Start Time: 1015 End Time: 1055 Facilitators: Maira Christon-McCall, Janis Sol A, NT Location: [address] Hall Dayroom       Affect/Mood: {RT BHH Affect/Mood:26271}   Participation Level: {RT BHH Participation [employer]   Participation Quality: {RT BHH Participation Quality:26268}   Behavior: {RT BHH Group Behavior:26269}   Speech/Thought Process: {RT BHH Speech/Thought:26276}   Insight: {RT BHH Insight:26272}   Judgement: {RT BHH Judgement:26278}   Modes of Intervention: {RT BHH Modes of Intervention:26277}   Patient Response to Interventions:  {RT BHH Patient Response to Intervention:26274}   Education Outcome:  {RT [place] Education Outcome:26279}   Clinical Observations/Individualized Feedback: *** was *** in their participation of session activities and group discussion. Pt identified ***   Plan: {RT BHH Tx Plan:26280}   Deedra Pro A Kateleen Encarnacion-McCall, NT,  03/21/2022 11:17 AM

## 2022-03-21 NOTE — Plan of Care (Signed)
  Problem: Safety: Goal: Periods of time without injury will increase Outcome: Progressing   

## 2022-03-21 NOTE — BH IP Treatment Plan (Signed)
Interdisciplinary Treatment and Diagnostic Plan New  03/21/2022 Time of Session: [place] MRN: [id number]  Principal Diagnosis: Schizoaffective disorder [facility])  Secondary Diagnoses: Principal Problem:   Schizoaffective disorder ([place])   Current Medications:  Current Facility-Administered Medications  Medication Dose Route Frequency Provider Last Rate Last Admin   acetaminophen (TYLENOL) tablet 650 mg  650 mg Oral Q4H PRN Massengill, Ovid Curd, MD       albuterol (VENTOLIN HFA) 108 (90 Base) MCG/ACT inhaler 2 puff  2 puff Inhalation Q4H PRN Massengill, Ovid Curd, MD       alum & mag hydroxide-simeth (MAALOX/MYLANTA) 200-200-20 MG/5ML suspension 30 mL  30 mL Oral Q2H PRN Massengill, Ovid Curd, MD       haloperidol (HALDOL) tablet 5 mg  5 mg Oral TID PRN Janine Limbo, MD       And   LORazepam (ATIVAN) tablet 2 mg  2 mg Oral TID PRN Janine Limbo, MD       And   diphenhydrAMINE (BENADRYL) capsule 50 mg  50 mg Oral TID PRN Massengill, Ovid Curd, MD       haloperidol lactate (HALDOL) injection 5 mg  5 mg Intramuscular TID PRN Massengill, Ovid Curd, MD       And   LORazepam (ATIVAN) injection 2 mg  2 mg Intramuscular TID PRN Massengill, Ovid Curd, MD       And   diphenhydrAMINE (BENADRYL) injection 50 mg  50 mg Intramuscular TID PRN Massengill, Ovid Curd, MD       gabapentin (NEURONTIN) capsule 300 mg  300 mg Oral TID Janine Limbo, MD   300 mg at 03/20/22 1656   influenza vac split quadrivalent PF (FLUARIX) injection 0.5 mL  0.5 mL Intramuscular Tomorrow-1000 Massengill, Ovid Curd, MD       LORazepam (ATIVAN) injection 1 mg  1 mg Intramuscular Once Onuoha, Chinwendu V, NP       metoprolol tartrate (LOPRESSOR) tablet 50 mg  50 mg Oral BID Massengill, Ovid Curd, MD   50 mg at 03/20/22 1656   nicotine (NICODERM CQ - dosed in mg/24 hours) patch 14 mg  14 mg Transdermal Daily Massengill, Nathan, MD       PTA Medications: Medications Prior to Admission  Medication Sig Dispense Refill Last Dose    albuterol (VENTOLIN HFA) 108 (90 Base) MCG/ACT inhaler Inhale 2 puffs into the lungs every 4 (four) hours as needed for wheezing or shortness of breath. (Patient not taking: Reported on 03/19/2022) 18 g 0    atomoxetine (STRATTERA) 40 MG capsule Take 80 mg by mouth daily.      clonazePAM (KLONOPIN) 1 MG tablet Take 1 mg by mouth daily as needed for anxiety.      gabapentin (NEURONTIN) 300 MG capsule Take 1 capsule (300 mg total) by mouth 3 (three) times daily. 90 capsule 0    INVEGA SUSTENNA 234 MG/1.5ML SUSY injection Inject 234 mg into the muscle every 30 (thirty) days. (Patient not taking: Reported on 03/19/2022)      lisinopril-hydrochlorothiazide (ZESTORETIC) 20-12.5 MG tablet Take 1 tablet by mouth daily. (Patient not taking: Reported on 03/19/2022) 30 tablet 0    metoprolol tartrate (LOPRESSOR) 50 MG tablet Take 1 tablet (50 mg total) by mouth 2 (two) times daily. (Patient not taking: Reported on 03/19/2022) 60 tablet 0    QUEtiapine (SEROQUEL) 50 MG tablet Take 300 mg by mouth at bedtime.       Patient Stressors: Financial difficulties   Medication change or noncompliance   Occupational concerns   Substance abuse  Patient Strengths: Capable of independent living  Communication skills  Supportive family/friends   Treatment Modalities: Medication Management, Group therapy, Case management,  1 to 1 session with clinician, Psychoeducation, Recreational therapy.   Physician Treatment Plan for Primary Diagnosis: Schizoaffective disorder ([place]) Long Term Goal(s):   Improvement in symptoms so as ready for discharge   Short Term Goals:  Ability to identify changes in lifestyle to reduce recurrence of condition will improve Ability to verbalize feelings will improve Ability to disclose and discuss suicidal ideas Ability to demonstrate self-control will improve Ability to identify and develop effective coping behaviors will improve Ability to maintain clinical measurements within normal limits  will improve Compliance with prescribed medications will improve Ability to identify triggers associated with substance abuse/mental health issues will improve  Medication Management: Evaluate patient's response, side effects, and tolerance of medication regimen.  Therapeutic Interventions: 1 to 1 sessions, Unit Group sessions and Medication administration.  Evaluation of Outcomes: Progressing  Physician Treatment Plan for Secondary Diagnosis: Principal Problem:   Schizoaffective disorder ([place])  Long Term Goal(s):   Improvement in symptoms so as ready for discharge     Short Term Goals:    Ability to identify changes in lifestyle to reduce recurrence of condition will improve Ability to verbalize feelings will improve Ability to disclose and discuss suicidal ideas Ability to demonstrate self-control will improve Ability to identify and develop effective coping behaviors will improve Ability to maintain clinical measurements within normal limits will improve Compliance with prescribed medications will improve Ability to identify triggers associated with substance abuse/mental health issues will improve      Medication Management: Evaluate patient's response, side effects, and tolerance of medication regimen.  Therapeutic Interventions: 1 to 1 sessions, Unit Group sessions and Medication administration.  Evaluation of Outcomes: Progressing   RN Treatment Plan for Primary Diagnosis: Schizoaffective disorder ([place]) Long Term Goal(s): Knowledge of disease and therapeutic regimen to maintain health will improve  Short Term Goals: Ability to remain free from injury will improve, Ability to verbalize frustration and anger appropriately will improve, Ability to demonstrate self-control, Ability to participate in decision making will improve, Ability to verbalize feelings will improve, Ability to disclose and discuss suicidal ideas, Ability to identify and develop effective coping behaviors  will improve, and Compliance with prescribed medications will improve  Medication Management: RN will administer medications as ordered by provider, will assess and evaluate patient's response and provide education to patient for prescribed medication. RN will report any adverse and/or side effects to prescribing provider.  Therapeutic Interventions: 1 on 1 counseling sessions, Psychoeducation, Medication administration, Evaluate responses to treatment, Monitor vital signs and CBGs as ordered, Perform/monitor CIWA, COWS, AIMS and Fall Risk screenings as ordered, Perform wound care treatments as ordered.  Evaluation of Outcomes: Progressing   LCSW Treatment Plan for Primary Diagnosis: Schizoaffective disorder ([place]) Long Term Goal(s): Safe transition to appropriate next level of care at discharge, Engage patient in therapeutic group addressing interpersonal concerns.  Short Term Goals: Engage patient in aftercare planning with referrals and resources  Therapeutic Interventions: Assess for all discharge needs, 1 to 1 time with Social worker, Explore available resources and support systems, Assess for adequacy in community support network, Educate family and significant other(s) on suicide prevention, Complete Psychosocial Assessment, Interpersonal group therapy.  Evaluation of Outcomes: Progressing   Progress in Treatment: Attending groups: Yes. Participating in groups: Yes. Taking medication as prescribed: Yes. Toleration medication: Yes. Family/Significant other contact made: Yes, individual(s) contacted:  First attempt 03/21/22 Agitated, refusing  food, meds, and assessment.   Patient understands diagnosis: Yes. Discussing patient identified problems/goals with staff: Yes. Medical problems stabilized or resolved: Yes. Denies suicidal/homicidal ideation: Yes. Issues/concerns per patient self-inventory: Yes. Other:   New problem(s) identified: No, Describe:  None  New Short Term/Long  Term Goal(s): medication stabilization, elimination of SI thoughts, development of comprehensive mental wellness plan.   Patient Goals:  Did Not Attend  Discharge Plan or Barriers: Patient recently admitted. CSW will continue to follow and assess for appropriate referrals and possible discharge planning.     Reason for Continuation of Hospitalization: Aggression Delusions  Hallucinations Medication stabilization Withdrawal symptoms  Estimated Length of Stay: 3-7 Days  Last [place] Suicide Severity Risk Score: Flowsheet Row Admission (Current) from 03/20/2022 in [facility] ED from 03/19/2022 in [facility] Department at [facility] ED from 01/31/2022 in [facility] [place] No Risk No Risk No Risk       Last PHQ 2/9 Scores:     No data to display         detox, medication management for mood stabilization; elimination of SI thoughts; development of comprehensive mental wellness/sobriety plan   Scribe for Treatment Team: Windle Guard, LCSW 03/21/2022 1:22 PM

## 2022-03-22 NOTE — Group Note (Signed)
LCSW Group Therapy Note   Group Date: 03/22/2022 Start Time: 1400 End Time: 1415    Topic: Cognitive Distortions  Due to limited staffing,  group was not held. Patient was provided therapeutic worksheets and asked to meet with CSW as needed.    Vassie Moselle, LCSW 03/22/2022  2:25 PM

## 2022-03-22 NOTE — Progress Notes (Signed)
Pt A & O to self, place and situation; dates with prompts. Observed to be irritable on initial interactions related to over stimulation related to noise from another peer "If y'all don't shut him up, I will. I'm fucking tired of all this noise all night. I couldn't fucking sleep and he's still going on". Denies SI, HI, AVH and pain when assessed "It's mostly at night, I'm fine with that right now if I can sleep". Vitals done, WNL. Pt remains afebrile without active cough /symptoms "my back is just sore from lying down that's it. Snacks given, pitcher filled with water and ice. Support, encouragement and reassurance offered to pt. Q 15 minutes safety checks maintained. Isolation and contact precaution continues as per infection control measures. All medications administered as ordered and effects monitored. Pt tolerated lunch, fluids and medications well. Remains safe in milieu.

## 2022-03-22 NOTE — Progress Notes (Signed)
Pt refused HS medications, pt did not participate in assessment

## 2022-03-22 NOTE — Progress Notes (Signed)
Pt refused his scheduled evening medications when approached "I don't want no med, I'm not doing no vitals, get out". Continues to be irritable, labile about not getting enough sleep from last night. Safety checks maintained at Q 15 minutes intervals.

## 2022-03-22 NOTE — [facility] Suicide Risk Assessment (Signed)
[place] INPATIENT:  Family/Significant Other Suicide Prevention Education  Suicide Prevention Education:  Patient Refusal for Family/Significant Other Suicide Prevention Education: The patient Alexander Pope has refused to provide written consent for family/significant other to be provided Family/Significant Other Suicide Prevention Education during admission and/or prior to discharge.  Physician notified.  Ailene Ravel A Anahit Klumb 03/22/2022, 12:08 PM

## 2022-03-22 NOTE — [facility] Group Notes (Signed)
[place] Group Notes- Psychoeducational Group. Pt was given a poem by Dairl Ponder titled '' The owl [email or web address] the chimpanzee '' Cognitive behavioral therapy techniques utilized to help pt identify healthy coping skills and ways to recognize anxiety. Pt did not attend due to isolation from covid status.

## 2022-03-22 NOTE — Progress Notes (Signed)
   03/22/22 2000  Psych Admission Type (Psych Patients Only)  Admission Status Voluntary  Psychosocial Assessment  Patient Complaints Anxiety;Irritability  Eye Contact Fair  Facial Expression Flat  Affect Depressed  Speech Slow  Interaction Assertive  Motor Activity Slow  Appearance/Hygiene Disheveled  Behavior Characteristics Cooperative  Mood Anxious;Labile  Aggressive Behavior  Effect No apparent injury  Thought Process  Coherency Circumstantial  Content Blaming self  Delusions None reported or observed  Perception Hallucinations  Hallucination Auditory;Visual  Judgment Poor  Confusion None  Danger to Self  Current suicidal ideation? Denies  Danger to Others  Danger to Others None reported or observed

## 2022-03-22 NOTE — Progress Notes (Signed)
[facility] MD Progress Note  03/22/2022 11:13 AM Alexander Pope  MRN:  [id number] Subjective:   Principal Problem: Schizoaffective disorder, bipolar type ([place]) Diagnosis: Principal Problem:   Schizoaffective disorder, bipolar type ([place]) Active Problems:   HTN (hypertension)   Tobacco abuse   Delta-9-tetrahydrocannabinol (THC) dependence (HCC)   Insomnia   Asthma   GAD (generalized anxiety disorder)  HPI: Alexander Pope is a 42 yo Caucasian male with a mental health history of schizoaffective disorder bipolar type, who presented to the [facility] ER on 03/19/22 with complaints of auditory and visual hallucinations of monkeys in the woods in the context of medication noncompliance.  Patient was transferred to [facility] for stabilization and treatment.   24 hour chart review: Patient noted to not fully engage in assessment day prior providing limited information, and stated that he was having auditory and visual hallucinations prior to admission, that people were actively trying to harm him, and also stated that he was seeing shapes and figures. He stated that everyone was lying to him where he was noted to be dysphoric, angry, and irritable. He was transferred from [place] to [facility] for yelling at staff during the night. He remains on Airborne and room restrictions due to testing COVID-19 positive. No other issues noted overnight. He received Invega Sustenna 234 mg IM 03/21/22 1533.   Assessment: Patient presents laying in bed. Casual, ill appearance. Alert and oriented to person, place. Flat, blunt affect.He reports feeling 'fine'. States that he slept through the night. States reason for admission was due to 'seeing things'; denies any auditory or visual hallucinations since Thursday night. Reports feeling safe on unit. Verbalized an understanding of precautions and need to keep the door closed for infection prevention reasons. He denies any anxiety or depression, suicidal or  homicidal ideations. Reports appropriate appetite. Contracts for safety at this time. Denies any questions about his medications.  Total Time spent with patient: 30 minutes  Past Psychiatric History: schizoaffective d'o, bipolar type  Past Medical History:  Past Medical History:  Diagnosis Date   Anxiety    Back pain    Depression    Hypertension    Schizoaffective disorder ([place])     Past Surgical History:  Procedure Laterality Date   BACK SURGERY     Family History:  Family History  Problem Relation Age of Onset   Stroke Mother    Family Psychiatric History: not noted Social History:  Social History   Substance and Sexual Activity  Alcohol Use Yes   Alcohol/week: 6.0 standard drinks of alcohol   Types: 6 Cans of beer per week   Comment: occasionally      Social History   Substance and Sexual Activity  Drug Use Not Currently   Types: Cocaine, Benzodiazepines   Comment: "Molly"    Social History   Socioeconomic History   Marital status: Single    Spouse name: Not on file   Number of children: Not on file   Years of education: Not on file   Highest education level: Not on file  Occupational History   Not on file  Tobacco Use   Smoking status: Every Day    Packs/day: 1.00    Types: Cigarettes   Smokeless tobacco: Never  Vaping Use   Vaping Use: Never used  Substance and Sexual Activity   Alcohol use: Yes    Alcohol/week: 6.0 standard drinks of alcohol    Types: 6 Cans of beer per week  Comment: occasionally    Drug use: Not Currently    Types: Cocaine, Benzodiazepines    Comment: "Molly"   Sexual activity: Not Currently  Other Topics Concern   Not on file  Social History Narrative   Not on file   Social Determinants of Health   Financial Resource Strain: Not on file  Food Insecurity: No Food Insecurity (03/20/2022)   Hunger Vital Sign    Worried About Running Out of Food in the Last Year: Never true    Ran Out of Food in the Last Year: Never  true  Transportation Needs: No Transportation Needs (03/20/2022)   PRAPARE - Hydrologist (Medical): No    Lack of Transportation (Non-Medical): No  Physical Activity: Not on file  Stress: Not on file  Social Connections: Not on file   Additional Social History:   Sleep: Fair  Appetite:  Fair  Current Medications: Current Facility-Administered Medications  Medication Dose Route Frequency Provider Last Rate Last Admin   acetaminophen (TYLENOL) tablet 650 mg  650 mg Oral Q4H PRN Massengill, Ovid Curd, MD       albuterol (VENTOLIN HFA) 108 (90 Base) MCG/ACT inhaler 2 puff  2 puff Inhalation Q4H PRN Massengill, Ovid Curd, MD       alum & mag hydroxide-simeth (MAALOX/MYLANTA) 200-200-20 MG/5ML suspension 30 mL  30 mL Oral Q2H PRN Massengill, Ovid Curd, MD       clonazePAM Bobbye [place]) tablet 0.5 mg  0.5 mg Oral Q12H Massengill, Ovid Curd, MD   0.5 mg at 03/22/22 [id number]   haloperidol (HALDOL) tablet 5 mg  5 mg Oral TID PRN Janine Limbo, MD       And   LORazepam (ATIVAN) tablet 2 mg  2 mg Oral TID PRN Janine Limbo, MD       And   diphenhydrAMINE (BENADRYL) capsule 50 mg  50 mg Oral TID PRN Massengill, Ovid Curd, MD       haloperidol lactate (HALDOL) injection 5 mg  5 mg Intramuscular TID PRN Massengill, Ovid Curd, MD       And   LORazepam (ATIVAN) injection 2 mg  2 mg Intramuscular TID PRN Massengill, Ovid Curd, MD       And   diphenhydrAMINE (BENADRYL) injection 50 mg  50 mg Intramuscular TID PRN Massengill, Ovid Curd, MD       divalproex (DEPAKOTE ER) 24 hr tablet 1,500 mg  1,500 mg Oral QHS Massengill, Ovid Curd, MD   1,500 mg at 03/21/22 2032   gabapentin (NEURONTIN) capsule 300 mg  300 mg Oral TID Janine Limbo, MD   300 mg at 03/22/22 0836   influenza vac split quadrivalent PF (FLUARIX) injection 0.5 mL  0.5 mL Intramuscular Tomorrow-1000 Massengill, Ovid Curd, MD       LORazepam (ATIVAN) injection 1 mg  1 mg Intramuscular Once Onuoha, Chinwendu V, NP       metoprolol  tartrate (LOPRESSOR) tablet 50 mg  50 mg Oral BID Massengill, Ovid Curd, MD   50 mg at 03/22/22 [id number]   nicotine (NICODERM CQ - dosed in mg/24 hours) patch 14 mg  14 mg Transdermal Daily Massengill, Ovid Curd, MD   14 mg at 03/22/22 0835   paliperidone (INVEGA SUSTENNA) injection 234 mg  234 mg Intramuscular Q28 days Massengill, Ovid Curd, MD   234 mg at 03/21/22 1533   paliperidone (INVEGA) 24 hr tablet 6 mg  6 mg Oral Daily Massengill, Nathan, MD   6 mg at 03/22/22 0834   QUEtiapine (SEROQUEL) tablet 200 mg  200 mg Oral QHS  Janine Limbo, MD   200 mg at 03/21/22 2031   traZODone (DESYREL) tablet 50 mg  50 mg Oral QHS PRN Janine Limbo, MD   50 mg at 03/21/22 2036    Lab Results: No results found for this or any previous visit (from the past 7 hour(s)).  Blood Alcohol level:  Lab Results  Component Value Date   ETH 50 (H) 03/19/2022   ETH <10 [id number]    Metabolic Disorder Labs: No results found for: "HGBA1C", "MPG" No results found for: "PROLACTIN" Lab Results  Component Value Date   CHOL 235 (H) 10/10/2014   TRIG 159 (H) 10/10/2014   HDL 50 10/10/2014   CHOLHDL 4.7 10/10/2014   VLDL 32 10/10/2014   LDLCALC 153 (H) 10/10/2014    Physical Findings: AIMS: Facial and Oral Movements Muscles of Facial Expression: None, normal Lips and Perioral Area: None, normal Jaw: None, normal Tongue: None, normal,Extremity Movements Upper (arms, wrists, hands, fingers): None, normal Lower (legs, knees, ankles, toes): None, normal, Trunk Movements Neck, shoulders, hips: None, normal, Overall Severity Severity of abnormal movements (highest score from questions above): None, normal Incapacitation due to abnormal movements: None, normal Patient's awareness of abnormal movements (rate only patient's report): No Awareness, Dental Status Current problems with teeth and/or dentures?: No Does patient usually wear dentures?: No  CIWA:  CIWA-Ar Total: 0 COWS:     Musculoskeletal: Strength &  Muscle Tone: within normal limits Gait & Station: normal Patient leans: N/A  Psychiatric Specialty Exam:  Presentation  General Appearance:  Disheveled; Casual  Eye Contact: Fair  Speech: Clear and Coherent  Speech Volume: Normal  Handedness: Right  Mood and Affect  Mood: Dysphoric  Affect: Blunt; Congruent  Thought Process  Thought Processes: Coherent  Descriptions of Associations:Intact  Orientation:Partial  Thought Content:Logical  History of Schizophrenia/Schizoaffective disorder:Yes  Duration of Psychotic Symptoms:Greater than six months  Hallucinations:Hallucinations: Other (comment) (denies)  Ideas of Reference:None  Suicidal Thoughts:Suicidal Thoughts: No  Homicidal Thoughts:Homicidal Thoughts: No  Sensorium  Memory: Immediate Good; Recent Good  Judgment: Fair  Insight: Fair  Materials engineer: Fair  Attention Span: Fair  Recall: [place] of Knowledge: Fair  Language: Fair  Psychomotor Activity  Psychomotor Activity: Psychomotor Activity: Normal  Assets  Assets: Resilience  Sleep  Sleep: Sleep: Fair  Physical Exam: Physical Exam Vitals and nursing note reviewed.  Constitutional:      Appearance: He is ill-appearing.  HENT:     Head: Normocephalic.     Nose: Nose normal.     Mouth/Throat:     Mouth: Mucous membranes are moist.     Pharynx: Oropharynx is clear.  Eyes:     Pupils: Pupils are equal, round, and reactive to light.  Cardiovascular:     Pulses: Normal pulses.  Pulmonary:     Effort: Pulmonary effort is normal.  Abdominal:     Palpations: Abdomen is soft.  Musculoskeletal:        General: Normal range of motion.     Cervical back: Normal range of motion.  Skin:    General: Skin is warm and dry.  Neurological:     Mental Status: He is alert. Mental status is at baseline.  Psychiatric:        Attention and Perception: He does not perceive auditory or visual  hallucinations.        Mood and Affect: Affect is blunt.        Speech: Speech normal.  Behavior: Behavior is withdrawn. Behavior is cooperative.        Thought Content: Thought content normal. Thought content is not paranoid or delusional. Thought content does not include homicidal or suicidal ideation. Thought content does not include homicidal or suicidal plan.        Cognition and Memory: Cognition and memory normal.        Judgment: Judgment normal.    Review of Systems  Psychiatric/Behavioral:  Positive for substance abuse.   All other systems reviewed and are negative.  Blood pressure (!) 119/94, pulse 80, temperature 98.5 F (36.9 C), temperature source Oral, resp. rate 20, height 6' (1.829 m), weight 108 kg, SpO2 99 %. Body mass index is 32.28 kg/m.  Treatment Plan Summary: Daily contact with patient to assess and evaluate symptoms and progress in treatment, Medication management, and Plan   PLAN:   Safety and Monitoring: Voluntary admission to inpatient psychiatric unit for safety, stabilization and treatment Daily contact with patient to assess and evaluate symptoms and progress in treatment Patient's case to be discussed in multi-disciplinary team meeting Observation Level : q15 minute checks Vital signs: q12 hours Precautions: Safety     Long Term Goal(s): Improvement in symptoms so as ready for discharge   Short Term Goals: Ability to identify changes in lifestyle to reduce recurrence of condition will improve, Ability to verbalize feelings will improve, Ability to disclose and discuss suicidal ideas, Ability to demonstrate self-control will improve, Ability to identify and develop effective coping behaviors will improve, Ability to maintain clinical measurements within normal limits will improve, Compliance with prescribed medications will improve, and Ability to identify triggers associated with substance abuse/mental health issues will improve   Diagnoses:   Principal Problem:   Schizoaffective disorder, bipolar type ([place]) Active Problems:   HTN (hypertension)   Tobacco abuse   Delta-9-tetrahydrocannabinol (THC) dependence (HCC)   Insomnia   Asthma   GAD (generalized anxiety disorder)   Medications -Continue:  - Depakote 1500 mg nightly at  bedtime for mood stabilization  - Klonopin 0.5 mg twice daily for  Anxiety  - Gabapentin 300 mg 3 times daily  for anxiety  - Invega 6 mg daily for psychosis  - Seroquel 200 mg nightly at  bedtime for sleep/mood stabilization  - Lopressor 50 mg twice daily for  hypertension - Albuterol inhaler as needed for  shortness of breath/wheezing  - Agitation protocol as needed: Haldol/Ativan/Benadryl please see the MAR for details  -Completed:  - Invega Sustenna 234 mg x 1  dose today 03/21/2022 for psychosis   Patient has been educated on rationales, possible side effects, and benefits of all medications listed above, verbalizes understanding, and agreeable to restarting all of his home medications listed above and all new medications listed above.   Other PRNS -Continue Tylenol 650 mg every 6 hours PRN for mild pain -Continue Maalox 30 mg every 4 hrs PRN for indigestion -Continue Imodium 2-4 mg as needed for diarrhea -Continue Milk of Magnesia as needed every 6 hrs for constipation -Continue Zofran disintegrating tabs every 6 hrs PRN for nausea    Discharge Planning: Social work and case management to assist with discharge planning and identification of hospital follow-up needs prior to discharge Estimated LOS: 5-7 days Discharge Concerns: Need to establish a safety plan; Medication compliance and effectiveness Discharge Goals: Return home with outpatient referrals for mental health follow-up including medication management/psychotherapy   I certify that inpatient services furnished can reasonably be expected to improve the patient's condition.  Inda Merlin, NP 03/22/2022,  11:13 AM
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
## 2022-03-23 DIAGNOSIS — F1999 Other psychoactive substance use, unspecified with unspecified psychoactive substance-induced disorder: Secondary | ICD-10-CM | POA: Diagnosis present

## 2022-03-23 MED ORDER — PALIPERIDONE ER 6 MG PO TB24: 6.0000 mg | ORAL_TABLET | Freq: Every day | ORAL | 0 refills | Status: DC

## 2022-03-23 MED ORDER — PALIPERIDONE PALMITATE ER 234 MG/1.5ML IM SUSY: 234.0000 mg | PREFILLED_SYRINGE | INTRAMUSCULAR | 0 refills | Status: DC

## 2022-03-23 MED ORDER — QUETIAPINE FUMARATE 200 MG PO TABS: 200.0000 mg | ORAL_TABLET | Freq: Every day | ORAL | 0 refills | Status: DC

## 2022-03-23 MED ORDER — DIVALPROEX SODIUM ER 500 MG PO TB24: 1500.0000 mg | ORAL_TABLET | Freq: Every day | ORAL | 0 refills | Status: DC

## 2022-03-23 NOTE — Progress Notes (Signed)
Writer brought breakfast to pt's room. Pt responded "take it out of here! I don't want it!". RN notified.

## 2022-03-23 NOTE — Progress Notes (Addendum)
Upon entering patients room, patient is noted to be lying on a mattress in the floor next to the heat/air machine. Pt sits up and states, "what do you want?" Writer informs patient writer has his medication. Patient states, "I'm not taking any damn medication and you can tell the doctor that! They lied to me! I was only supposed to be here for a few daysGlass blower/designer provided verbal encouragement and support. Attempted to provide patient with water and encouraged pt to eat meals when brought to him. Patient states, "No! I'm not eating, drinking or doing anything until you all let me out of here!" Provider made aware. Patient also denied writers request to participate in assessment questions.

## 2022-03-23 NOTE — Progress Notes (Signed)
Pt declined for NP to do safety planning with his mother, patient stated, "she thinks medication is the reason I have these episodes, talking to her would not be productive, my ride will be here at 1pm." Patient did take his morning medication after finding out he will discharge today.

## 2022-03-23 NOTE — Progress Notes (Signed)
  [facility] Adult Case Management Discharge Plan :  Will you be returning to the same living situation after discharge:  No. At discharge, do you have transportation home?: Yes,  Patient states that he will be picked up by his mother, but did not provide consent for CSW to conduct safety planning. Do you have the ability to pay for your medications: Yes,  Insured  Release of information consent forms completed and in the chart;  Patient's signature needed at discharge.  Patient to Follow up at:  Follow-up Information     Services, [facility] Follow up.   Contact information: [place] [id number] [phone]                 Next level of care provider has access to [place] and Suicide Prevention discussed: No. Does not consent     Has patient been referred to the Quitline?: Patient refused referral  Patient has been referred for addiction treatment: Yes Patient to continue working towards treatment goals after discharge. Patient no longer meets criteria for inpatient criteria per attending physician. Continue taking medications as prescribed, nursing to provide instructions at discharge. Follow up with all scheduled appointments.   Alexander Trim S Namari Breton, LCSW 03/23/2022, 11:33 AM

## 2022-03-23 NOTE — Discharge Summary (Signed)
Physician Discharge Summary Note  Patient:  Alexander Pope is an 42 y.o., male MRN:  [id number] DOB:  1980-05-18 Patient phone:  [phone] (home)  Patient address:   [place] [address],  Total Time spent with patient: 45 minutes  Date of Admission:  03/20/2022 Date of Discharge: 03/23/2022  Reason for Admission:   HPI: Alexander Pope is a 42 yo Caucasian male with a mental health history of schizoaffective disorder bipolar type, who presented to the [facility] ER on 03/19/22 with complaints of auditory and visual hallucinations of monkeys in the woods in the context of medication noncompliance and substance use. UDS+ THC. Patient was voluntarily transferred to [facility] for further stabilization and treatment.    24 hour chart review: Patient noted to not fully engage staff assessments over past several days prior providing limited information; over past 24 hours he has become increasingly ambivalent to care, refusing medications, and inquiring about discharge. He was transferred from [place] to [facility] for yelling at staff Friday night. He has remained on Airborne Precautions and room restrictions due to testing COVID-19 positive. He received Invega Sustenna 234 mg IM 03/21/22 1533.    Assessment: Patient assessed in his room where presents laying on his mattress on the floor. Casual, slightly disheveled appearance. Alert and oriented to person, place. Blunt affect. He reports physically feeling 'fine' and states he is 'ready for discharge. I can do this at home. I hadn't had my medications, I've gotten my injection since I've been here. I don't want o harm myself or anyone and I'm not seeing or hearing anything that you don't. I don't want to be in here anymore and I 'm getting pissed off. I have a home to go to. I'm tired of hearing this dude scream all night. I need to be at home. Please I signed my voluntary paperwork and I'm ready to go. I can  quarantine at home. This feels like jail to me'. He continues to deny any auditory or visual hallucinations since Thursday night. Contracts for safety on unit. States plan to follow up with personal psychiatrist for any further medication changes. Verbalized an understanding of precautions and need to keep the door closed for infection prevention reasons. He denies any anxiety or depression, suicidal or homicidal ideations. Reports appropriate appetite. Declined provider contact mother or family for discharge planning. Denies any questions about his medications.  Care was discussed with attending MD Winfred Leeds. Plan to discharge patient home with self care. Verified patient does have outpatient providers and appointments set.   Principal Problem: Schizoaffective disorder, bipolar type ([place]) Discharge Diagnoses: Principal Problem:   Schizoaffective disorder, bipolar type ([place]) Active Problems:   HTN (hypertension)   Tobacco abuse   Alexander-9-tetrahydrocannabinol (THC) dependence ([facility])   Insomnia   Asthma   GAD (generalized anxiety disorder)   Past Psychiatric History: schizoaffective disorder bipolar type, generalized  anxiety disorder, substance induced disorder, polysubstance abuse including opioids  Past Medical History:  Past Medical History:  Diagnosis Date   Anxiety    Back pain    Depression    Hypertension    Schizoaffective disorder ([place])     Past Surgical History:  Procedure Laterality Date   BACK SURGERY     Family History:  Family History  Problem Relation Age of Onset   Stroke Mother    Family Psychiatric History: not noted Social History:  Social History   Substance and Sexual Activity  Alcohol Use  Yes   Alcohol/week: 6.0 standard drinks of alcohol   Types: 6 Cans of beer per week   Comment: occasionally      Social History   Substance and Sexual Activity  Drug Use Not Currently   Types: Cocaine, Benzodiazepines   Comment: "Molly"    Social History    Socioeconomic History   Marital status: Single    Spouse name: Not on file   Number of children: Not on file   Years of education: Not on file   Highest education level: Not on file  Occupational History   Not on file  Tobacco Use   Smoking status: Every Day    Packs/day: 1.00    Types: Cigarettes   Smokeless tobacco: Never  Vaping Use   Vaping Use: Never used  Substance and Sexual Activity   Alcohol use: Yes    Alcohol/week: 6.0 standard drinks of alcohol    Types: 6 Cans of beer per week    Comment: occasionally    Drug use: Not Currently    Types: Cocaine, Benzodiazepines    Comment: "Molly"   Sexual activity: Not Currently  Other Topics Concern   Not on file  Social History Narrative   Not on file   Social Determinants of Health   Financial Resource Strain: Not on file  Food Insecurity: No Food Insecurity (03/20/2022)   Hunger Vital Sign    Worried About Running Out of Food in the Last Year: Never true    Ran Out of Food in the Last Year: Never true  Transportation Needs: No Transportation Needs (03/20/2022)   PRAPARE - Hydrologist (Medical): No    Lack of Transportation (Non-Medical): No  Physical Activity: Not on file  Stress: Not on file  Social Connections: Not on file    Hospital Course:   HOSPITAL COURSE:  During the patient's hospitalization, patient had extensive initial psychiatric evaluation, and follow-up psychiatric evaluations every day.  Psychiatric diagnoses provided upon initial assessment: schizoaffective disorder, bipolar type, Alexander 9 THC dependence, substance induced disorder  Patient's psychiatric medications were adjusted on admission: see MAR  During the hospitalization, other adjustments were made to the patient's psychiatric medication regimen: Patient received Paliperidone LAI 234 mg IM 03/21/22 in the right deltoid; Clonazepam decreased from 1 mg daily to 0.5 mg daily, increased Quetiapine from 50 mg to  200 mg daily at bedtime, Paliperidone 6 mg daily.   Patient's care was discussed during the interdisciplinary team meeting every day during the hospitalization.  The patient denies having side effects to prescribed psychiatric medication.  Gradually, patient started adjusting to milieu. The patient was evaluated each day by a clinical provider to ascertain response to treatment. Improvement was noted by the patient's report of decreasing symptoms, improved sleep and appetite, affect, medication tolerance, behavior, and participation in unit programming.  Patient was asked each day to complete a self inventory noting mood, mental status, pain, new symptoms, anxiety and concerns.    Symptoms were reported as significantly decreased or resolved completely by discharge.   On day of discharge, the patient reports that their mood is stable. The patient denied having suicidal thoughts for more than 48 hours prior to discharge.  Patient denies having homicidal thoughts.  Patient denies having auditory hallucinations.  Patient denies any visual hallucinations or other symptoms of psychosis. The patient was motivated to continue taking medication with a goal of continued improvement in mental health.   The patient reports their target  psychiatric symptoms of hallucinations responded well to the psychiatric medications, and the patient reports overall benefit other psychiatric hospitalization. Supportive psychotherapy was provided to the patient. The patient also participated in regular group therapy while hospitalized. Coping skills, problem solving as well as relaxation therapies were also part of the unit programming.  Labs were reviewed with the patient, and abnormal results were discussed with the patient.  The patient is able to verbalize their individual safety plan to this provider.  # It is recommended to the patient to continue psychiatric medications as prescribed, after discharge from the  hospital.    # It is recommended to the patient to follow up with your outpatient psychiatric provider and PCP.  # It was discussed with the patient, the impact of alcohol, drugs, tobacco have been there overall psychiatric and medical wellbeing, and total abstinence from substance use was recommended the patient.ed.  # Prescriptions provided or sent directly to preferred pharmacy at discharge. Patient agreeable to plan. Given opportunity to ask questions. Appears to feel comfortable with discharge.    # In the event of worsening symptoms, the patient is instructed to call the crisis hotline, 911 and or go to the nearest ED for appropriate evaluation and treatment of symptoms. To follow-up with primary care provider for other medical issues, concerns and or health care needs  # Patient was discharged home with a plan to follow up as noted below.   Physical Findings: AIMS: Facial and Oral Movements Muscles of Facial Expression: None, normal Lips and Perioral Area: None, normal Jaw: None, normal Tongue: None, normal,Extremity Movements Upper (arms, wrists, hands, fingers): None, normal Lower (legs, knees, ankles, toes): None, normal, Trunk Movements Neck, shoulders, hips: None, normal, Overall Severity Severity of abnormal movements (highest score from questions above): None, normal Incapacitation due to abnormal movements: None, normal Patient's awareness of abnormal movements (rate only patient's report): No Awareness, Dental Status Current problems with teeth and/or dentures?: No Does patient usually wear dentures?: No  CIWA:  CIWA-Ar Total: 0 COWS:     Musculoskeletal: Strength & Muscle Tone: within normal limits Gait & Station: normal Patient leans: N/A   Psychiatric Specialty Exam:  Presentation  General Appearance:  Casual  Eye Contact: Fair  Speech: Clear and Coherent  Speech Volume: Normal  Handedness: Right   Mood and Affect   Mood: Dysphoric  Affect: Blunt; Congruent   Thought Process  Thought Processes: Coherent  Descriptions of Associations:Intact  Orientation:Partial  Thought Content:Logical  History of Schizophrenia/Schizoaffective disorder:Yes  Duration of Psychotic Symptoms:Greater than six months  Hallucinations:Hallucinations: None  Ideas of Reference:None  Suicidal Thoughts:Suicidal Thoughts: No  Homicidal Thoughts:Homicidal Thoughts: No  Sensorium  Memory: Immediate Good; Recent Fair  Judgment: Fair  Insight: Fair  Community education officer  Concentration: Fair  Attention Span: Fair  Recall: [employer] of Knowledge: Fair  Language: Fair  Psychomotor Activity  Psychomotor Activity: Psychomotor Activity: Normal  Assets  Assets: Resilience; Housing; Financial Resources/Insurance  Sleep  Sleep: Sleep: Fair  Physical Exam: Physical Exam Vitals and nursing note reviewed.  Constitutional:      Appearance: He is normal weight. He is ill-appearing.  HENT:     Head: Normocephalic.     Nose: Nose normal.     Mouth/Throat:     Mouth: Mucous membranes are moist.     Pharynx: Oropharynx is clear.  Eyes:     Pupils: Pupils are equal, round, and reactive to light.  Cardiovascular:     Rate and Rhythm: Normal rate.  Pulses: Normal pulses.  Pulmonary:     Effort: Pulmonary effort is normal.  Abdominal:     Palpations: Abdomen is soft.  Musculoskeletal:        General: Normal range of motion.     Cervical back: Normal range of motion.  Skin:    General: Skin is warm and dry.  Neurological:     Mental Status: He is alert and oriented to person, place, and time.  Psychiatric:        Attention and Perception: Attention normal. He does not perceive auditory or visual hallucinations.        Mood and Affect: Affect is blunt.        Speech: Speech normal.        Behavior: Behavior is cooperative.        Thought Content: Thought content normal. Thought  content is not paranoid or delusional. Thought content does not include homicidal or suicidal ideation. Thought content does not include homicidal or suicidal plan.        Cognition and Memory: Cognition and memory normal.        Judgment: Judgment normal.    Review of Systems  Psychiatric/Behavioral:  Positive for substance abuse. Negative for hallucinations.   All other systems reviewed and are negative.  Blood pressure 113/83, pulse 87, temperature 98.7 F (37.1 C), temperature source Oral, resp. rate 18, height 6' (1.829 m), weight 108 kg, SpO2 98 %. Body mass index is 32.28 kg/m.   Social History   Tobacco Use  Smoking Status Every Day   Packs/day: 1.00   Types: Cigarettes  Smokeless Tobacco Never   Tobacco Cessation:  Prescription not provided because: patient declined  Blood Alcohol level:  Lab Results  Component Value Date   ETH 50 (H) 03/19/2022   ETH <10 [id number]   Metabolic Disorder Labs:  No results found for: "HGBA1C", "MPG" No results found for: "PROLACTIN" Lab Results  Component Value Date   CHOL 235 (H) 10/10/2014   TRIG 159 (H) 10/10/2014   HDL 50 10/10/2014   CHOLHDL 4.7 10/10/2014   VLDL 32 10/10/2014   LDLCALC 153 (H) 10/10/2014    See Psychiatric Specialty Exam and Suicide Risk Assessment completed by Attending Physician prior to discharge.  Discharge destination:  Home  Is patient on multiple antipsychotic therapies at discharge:  No   Has Patient had three or more failed trials of antipsychotic monotherapy by history:  No  Recommended Plan for Multiple Antipsychotic Therapies: NA  Discharge Instructions     Diet - low sodium heart healthy   Complete by: As directed    Increase activity slowly   Complete by: As directed       Allergies as of 03/23/2022   No Known Allergies      Medication List     STOP taking these medications    albuterol 108 (90 Base) MCG/ACT inhaler Commonly known as: VENTOLIN HFA   atomoxetine 40 MG  capsule Commonly known as: STRATTERA   clonazePAM 1 MG tablet Commonly known as: KLONOPIN   lisinopril-hydrochlorothiazide 20-12.5 MG tablet Commonly known as: Zestoretic       TAKE these medications      Indication  divalproex 500 MG 24 hr tablet Commonly known as: DEPAKOTE ER Take 3 tablets (1,500 mg total) by mouth at bedtime.  Indication: schizoaffective disorder   gabapentin 300 MG capsule Commonly known as: NEURONTIN Take 1 capsule (300 mg total) by mouth 3 (three) times daily.  Indication: Agitation, Alcohol Withdrawal  Syndrome   metoprolol tartrate 50 MG tablet Commonly known as: LOPRESSOR Take 1 tablet (50 mg total) by mouth 2 (two) times daily.  Indication: High Blood Pressure Disorder   paliperidone 234 MG/1.5ML injection Commonly known as: INVEGA SUSTENNA Inject 234 mg into the muscle every 28 (twenty-eight) days. Start taking on: April 18, 2022 What changed: when to take this  Indication: Schizoaffective Disorder   paliperidone 6 MG 24 hr tablet Commonly known as: INVEGA Take 1 tablet (6 mg total) by mouth daily. Start taking on: March 24, 2022  Indication: Schizoaffective Disorder   QUEtiapine 200 MG tablet Commonly known as: SEROQUEL Take 1 tablet (200 mg total) by mouth at bedtime. What changed:  medication strength how much to take  Indication: schizoaffective disorder        Follow-up Information     Services, [facility] Follow up.   Contact information: [place] [address] [phone]                 Follow-up recommendations:   Activity: As tolerated  Diet: Heart healthy  Other: -Follow-up with your outpatient psychiatric provider -instructions on appointment date, time, and address (location) are provided to you in discharge paperwork.   -Take your psychiatric medications as prescribed at discharge - instructions are provided to you in the discharge paperwork.    -Follow-up with  outpatient primary care doctor and other specialists -for management of preventative medicine and chronic medical disease, including:   -Testing: Follow-up with outpatient provider for abnormal lab results:  none   -Recommend abstinence from alcohol, tobacco, and other illicit drug use at discharge.    -If your psychiatric symptoms recur, worsen, or if you have side effects to your psychiatric medications, call your outpatient psychiatric provider, 911, [phone] or go to the nearest emergency department.   -If suicidal thoughts recur, call your outpatient psychiatric provider, 911, [phone] or go to the nearest emergency department.  Comments:    Signed: Inda Merlin, NP 03/23/2022, 11:03 AM

## 2022-03-23 NOTE — BHH Suicide Risk Assessment (Signed)
Suicide Risk Assessment  Discharge Assessment    [name] Discharge Suicide Risk Assessment   Principal Problem: Schizoaffective disorder, bipolar type [facility]) Discharge Diagnoses: Principal Problem:   Schizoaffective disorder, bipolar type ([place]) Active Problems:   HTN (hypertension)   Tobacco abuse   Delta-9-tetrahydrocannabinol (THC) dependence ([place])   Insomnia   Asthma   GAD (generalized anxiety disorder)   Alexander Pope was admitted for Schizoaffective disorder, bipolar type ([place]) and crisis management.  He was treated with the following medications see MAR; Paliperidone  234 mg IM given 03/21/22 in the right deltoid.  Alexander Pope was discharged with current medication and was instructed on how to take medications as prescribed; (details listed below under Medication List).  Medical problems were identified and treated as needed.  Home medications were restarted as appropriate.  Improvement was monitored by observation and Alexander Pope daily report of symptom reduction.  Emotional and mental status was monitored by daily self-inventory reports completed by Alexander Pope and clinical staff.         Alexander Pope was evaluated by the treatment team for stability and plans for continued recovery upon discharge.  Alexander Pope motivation was an integral factor for scheduling further treatment.  Employment, transportation, bed availability, health status, family support, and any pending legal issues were also considered during his hospital stay.  He was offered further treatment options upon discharge including but not limited to Residential, Intensive Outpatient, and Outpatient treatment.  Alexander Pope will follow up with the services as listed below under Follow Up Information.     Upon completion of this admission the [name] was both mentally and medically stable for discharge denying suicidal/homicidal ideation, auditory/visual/tactile hallucinations, delusional thoughts and paranoia.      He refused provider to contact with mother for collateral and discharge planning.   Total Time spent with patient: 45 minutes  Musculoskeletal: Strength & Muscle Tone: within normal limits Gait & Station: normal Patient leans: N/A  Psychiatric Specialty Exam  Presentation  General Appearance:  Casual  Eye Contact: Fair  Speech: Clear and Coherent  Speech Volume: Normal  Handedness: Right  Mood and Affect  Mood: Dysphoric  Duration of Depression Symptoms: No data recorded Affect: Blunt; Congruent  Thought Process  Thought Processes: Coherent  Descriptions of Associations:Intact  Orientation:Partial  Thought Content:Logical  History of Schizophrenia/Schizoaffective disorder:Yes  Duration of Psychotic Symptoms:Greater than six months  Hallucinations:Hallucinations: None  Ideas of Reference:None  Suicidal Thoughts:Suicidal Thoughts: No  Homicidal Thoughts:Homicidal Thoughts: No  Sensorium  Memory: Immediate Good; Recent Fair  Judgment: Fair  Insight: Fair  Community education officer  Concentration: Fair  Attention Span: Fair  Recall: [employer] of Knowledge: Fair  Language: Fair  Psychomotor Activity  Psychomotor Activity: Psychomotor Activity: Normal  Assets  Assets: Resilience; Housing; Financial Resources/Insurance  Sleep  Sleep: Sleep: Fair  Physical Exam: Physical Exam Vitals and nursing note reviewed.  Constitutional:      Appearance: He is ill-appearing.     Comments: Patient is COVID+  HENT:     Head: Normocephalic.     Nose: Nose normal.  Eyes:     Pupils: Pupils are equal, round, and reactive to light.  Cardiovascular:     Rate and Rhythm: Normal rate.     Pulses: Normal pulses.  Pulmonary:     Effort: Pulmonary effort is normal.  Abdominal:     Palpations: Abdomen is soft.  Musculoskeletal:        General:  Normal range of motion.     Cervical back: Normal range of motion.  Skin:    General: Skin  is warm and dry.  Neurological:     Mental Status: He is alert and oriented to person, place, and time.  Psychiatric:        Attention and Perception: Attention and perception normal. He does not perceive auditory or visual hallucinations.        Mood and Affect: Affect is blunt.        Speech: Speech normal.        Behavior: Behavior is cooperative.        Thought Content: Thought content normal. Thought content is not paranoid or delusional. Thought content does not include homicidal or suicidal ideation. Thought content does not include homicidal or suicidal plan.        Cognition and Memory: Cognition and memory normal.        Judgment: Judgment normal.    Review of Systems  Psychiatric/Behavioral:  Positive for substance abuse. Negative for hallucinations, memory loss and suicidal ideas. The patient is not nervous/anxious and does not have insomnia.   All other systems reviewed and are negative.  Blood pressure 113/83, pulse 87, temperature 98.7 F (37.1 C), temperature source Oral, resp. rate 18, height 6' (1.829 m), weight 108 kg, SpO2 98 %. Body mass index is 32.28 kg/m.  Mental Status Per Nursing Assessment::   On Admission:  NA  Demographic Factors:  Male, Caucasian, Living alone, and Unemployed  Loss Factors: NA and no known loss factors identified at this time  Historical Factors: Impulsivity and substance abuse history  Risk Reduction Factors:   Positive social support, Positive therapeutic relationship, and Long Acting Injectable use  Continued Clinical Symptoms:  Alcohol/Substance Abuse/Dependencies Schizophrenia:   Paranoid or undifferentiated type More than one psychiatric diagnosis Previous Psychiatric Diagnoses and Treatments  Cognitive Features That Contribute To Risk:  Closed-mindedness    Suicide Risk:  Mild:  Suicidal ideation of limited frequency, intensity, duration, and specificity.  There are no identifiable plans, no associated intent, mild  dysphoria and related symptoms, good self-control (both objective and subjective assessment), few other risk factors, and identifiable protective factors, including available and accessible social support.   Follow-up Information     Services, [facility] Follow up.   Contact information: [place] [address] [phone]                 Plan Of Care/Follow-up recommendations:  Activity: As tolerated  Diet: Heart healthy  Other: -Follow-up with your outpatient psychiatric provider -instructions on appointment date, time, and address (location) are provided to you in discharge paperwork.   -Take your psychiatric medications as prescribed at discharge - instructions are provided to you in the discharge paperwork.    -Follow-up with outpatient primary care doctor and other specialists -for management of preventative medicine and chronic medical disease, including:   -Testing: Follow-up with outpatient provider for abnormal lab results:  none   -Recommend abstinence from alcohol, tobacco, and other illicit drug use at discharge.    -If your psychiatric symptoms recur, worsen, or if you have side effects to your psychiatric medications, call your outpatient psychiatric provider, 911, [phone] or go to the nearest emergency department.   -If suicidal thoughts recur, call your outpatient psychiatric provider, 911, [phone] or go to the nearest emergency department.  -Complete quarantine. Wear a mask in public to prevent spread of COVID-19.    Person Under Monitoring Name:  Alexander Pope  Location: [address] [place] [address]   Infection Prevention Recommendations for Individuals Confirmed to have, or Being Evaluated for, 2019 Novel Coronavirus (COVID-19) Infection Who Receive Care at Home  Individuals who are confirmed to have, or are being evaluated for, COVID-19 should follow the prevention steps below until a healthcare provider or local  or state health department says they can return to normal activities.  Stay home except to get medical care You should restrict activities outside your home, except for getting medical care. Do not go to work, school, or public areas, and do not use public transportation or taxis.  Call ahead before visiting your doctor Before your medical appointment, call the healthcare provider and tell them that you have, or are being evaluated for, COVID-19 infection. This will help the healthcare provider's office take steps to keep other people from getting infected. Ask your healthcare provider to call the local or state health department.  Monitor your symptoms Seek prompt medical attention if your illness is worsening (e.g., difficulty breathing). Before going to your medical appointment, call the healthcare provider and tell them that you have, or are being evaluated for, COVID-19 infection. Ask your healthcare provider to call the local or state health department.  Wear a facemask You should wear a facemask that covers your nose and mouth when you are in the same room with other people and when you visit a healthcare provider. People who live with or visit you should also wear a facemask while they are in the same room with you.  Separate yourself from other people in your home As much as possible, you should stay in a different room from other people in your home. Also, you should use a separate bathroom, if available.  Avoid sharing household items You should not share dishes, drinking glasses, cups, eating utensils, towels, bedding, or other items with other people in your home. After using these items, you should wash them thoroughly with soap and water.  Cover your coughs and sneezes Cover your mouth and nose with a tissue when you cough or sneeze, or you can cough or sneeze into your sleeve. Throw used tissues in a lined trash can, and immediately wash your hands with soap and water  for at least 20 seconds or use an alcohol-based hand rub.  Wash your [facility] your hands often and thoroughly with soap and water for at least 20 seconds. You can use an alcohol-based hand sanitizer if soap and water are not available and if your hands are not visibly dirty. Avoid touching your eyes, nose, and mouth with unwashed hands.   Prevention Steps for Caregivers and Household Members of Individuals Confirmed to have, or Being Evaluated for, COVID-19 Infection Being Cared for in the Home  If you live with, or provide care at home for, a person confirmed to have, or being evaluated for, COVID-19 infection please follow these guidelines to prevent infection:  Follow healthcare provider's instructions Make sure that you understand and can help the patient follow any healthcare provider instructions for all care.  Provide for the patient's basic needs You should help the patient with basic needs in the home and provide support for getting groceries, prescriptions, and other personal needs.  Monitor the patient's symptoms If they are getting sicker, call his or her medical provider and tell them that the patient has, or is being evaluated for, COVID-19 infection. This will help the healthcare provider's office take steps to keep  other people from getting infected. Ask the healthcare provider to call the local or state health department.  Limit the number of people who have contact with the patient If possible, have only one caregiver for the patient. Other household members should stay in another home or place of residence. If this is not possible, they should stay in another room, or be separated from the patient as much as possible. Use a separate bathroom, if available. Restrict visitors who do not have an essential need to be in the home.  Keep older adults, very young children, and other sick people away from the patient Keep older adults, very young children, and those who  have compromised immune systems or chronic health conditions away from the patient. This includes people with chronic heart, lung, or kidney conditions, diabetes, and cancer.  Ensure good ventilation Make sure that shared spaces in the home have good air flow, such as from an air conditioner or an opened window, weather permitting.  Wash your hands often Wash your hands often and thoroughly with soap and water for at least 20 seconds. You can use an alcohol based hand sanitizer if soap and water are not available and if your hands are not visibly dirty. Avoid touching your eyes, nose, and mouth with unwashed hands. Use disposable paper towels to dry your hands. If not available, use dedicated cloth towels and replace them when they become wet.  Wear a facemask and gloves Wear a disposable facemask at all times in the room and gloves when you touch or have contact with the patient's blood, body fluids, and/or secretions or excretions, such as sweat, saliva, sputum, nasal mucus, vomit, urine, or feces.  Ensure the mask fits over your nose and mouth tightly, and do not touch it during use. Throw out disposable facemasks and gloves after using them. Do not reuse. Wash your hands immediately after removing your facemask and gloves. If your personal clothing becomes contaminated, carefully remove clothing and launder. Wash your hands after handling contaminated clothing. Place all used disposable facemasks, gloves, and other waste in a lined container before disposing them with other household waste. Remove gloves and wash your hands immediately after handling these items.  Do not share dishes, glasses, or other household items with the patient Avoid sharing household items. You should not share dishes, drinking glasses, cups, eating utensils, towels, bedding, or other items with a patient who is confirmed to have, or being evaluated for, COVID-19 infection. After the person uses these items, you  should wash them thoroughly with soap and water.  Wash laundry thoroughly Immediately remove and wash clothes or bedding that have blood, body fluids, and/or secretions or excretions, such as sweat, saliva, sputum, nasal mucus, vomit, urine, or feces, on them. Wear gloves when handling laundry from the patient. Read and follow directions on labels of laundry or clothing items and detergent. In general, wash and dry with the warmest temperatures recommended on the label.  Clean all areas the individual has used often Clean all touchable surfaces, such as counters, tabletops, doorknobs, bathroom fixtures, toilets, phones, keyboards, tablets, and bedside tables, every day. Also, clean any surfaces that may have blood, body fluids, and/or secretions or excretions on them. Wear gloves when cleaning surfaces the patient has come in contact with. Use a diluted bleach solution (e.g., dilute bleach with 1 part bleach and 10 parts water) or a household disinfectant with a label that says EPA-registered for coronaviruses. To make a bleach solution at home, add  1 tablespoon of bleach to 1 quart (4 cups) of water. For a larger supply, add  cup of bleach to 1 gallon (16 cups) of water. Read labels of cleaning products and follow recommendations provided on product labels. Labels contain instructions for safe and effective use of the cleaning product including precautions you should take when applying the product, such as wearing gloves or eye protection and making sure you have good ventilation during use of the product. Remove gloves and wash hands immediately after cleaning.  Monitor yourself for signs and symptoms of illness Caregivers and household members are considered close contacts, should monitor their health, and will be asked to limit movement outside of the home to the extent possible. Follow the monitoring steps for close contacts listed on the symptom monitoring form.   ? If you have additional  questions, contact your local health department or call the epidemiologist on call at [phone] (available 24/7). ? This guidance is subject to change. For the most up-to-date guidance from [facility], please refer to their website: YouBlogs.pl  Inda Merlin, NP 03/23/2022, 10:59 AM

## 2022-03-24 NOTE — BHH Group Notes (Signed)
Spirituality group facilitated by Chaplain Katy Milica Gully, BCC.   Group Description: Group focused on topic of hope. Patients participated in facilitated discussion around topic, connecting with one another around experiences and definitions for hope. Group members engaged with visual explorer photos, reflecting on what hope looks like for them today. Group engaged in discussion around how their definitions of hope are present today in hospital.   Modalities: Psycho-social ed, Adlerian, Narrative, MI   Patient Progress: Did not attend.  

## 2022-03-26 ENCOUNTER — Other Ambulatory Visit (HOSPITAL_COMMUNITY): Payer: Self-pay

## 2023-04-15 ENCOUNTER — Ambulatory Visit: Payer: Medicare Other | Admitting: Physician Assistant

## 2023-04-15 ENCOUNTER — Encounter: Payer: Self-pay | Admitting: Physician Assistant

## 2023-04-15 VITALS — BP 162/110 | HR 113 | Temp 98.0°F | Ht 72.0 in | Wt 231.0 lb

## 2023-04-15 DIAGNOSIS — I10 Essential (primary) hypertension: Secondary | ICD-10-CM

## 2023-04-15 DIAGNOSIS — Z7689 Persons encountering health services in other specified circumstances: Secondary | ICD-10-CM

## 2023-04-15 DIAGNOSIS — F250 Schizoaffective disorder, bipolar type: Secondary | ICD-10-CM | POA: Diagnosis not present

## 2023-04-15 DIAGNOSIS — F259 Schizoaffective disorder, unspecified: Secondary | ICD-10-CM | POA: Insufficient documentation

## 2023-04-15 MED ORDER — METOPROLOL SUCCINATE ER 50 MG PO TB24: 50.0000 mg | ORAL_TABLET | Freq: Every day | ORAL | 3 refills | Status: AC

## 2023-04-15 MED ORDER — LISINOPRIL-HYDROCHLOROTHIAZIDE 10-12.5 MG PO TABS: 1.0000 | ORAL_TABLET | Freq: Every day | ORAL | 3 refills | Status: AC

## 2023-04-15 NOTE — Assessment & Plan Note (Addendum)
 162/110 Uncontrolled. Per chart review restarting previous medications. Metoprolol and lisinopril/hydrochlorothiazide daily. Patient prescribed propanolol as needed for social anxiety. He does not take this medication frequently.  Discussed DASH diet and dietary sodium restrictions.  Increase dietary efforts and physical activity. Lab work ordered today.  Follow up in 6 weeks.
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
## 2023-04-15 NOTE — Assessment & Plan Note (Signed)
 Stable on medication. Patient sees a psychiatrist via telehealth once a month. Continue current treatment plan.

## 2023-05-27 ENCOUNTER — Ambulatory Visit: Admitting: Physician Assistant

## 2023-07-26 ENCOUNTER — Emergency Department (HOSPITAL_COMMUNITY)
Admission: EM | Admit: 2023-07-26 | Discharge: 2023-07-27 | Disposition: A | Discharge status: Other Acute Inpatient Hospital | Attending: Emergency Medicine | Admitting: Emergency Medicine

## 2023-07-26 ENCOUNTER — Other Ambulatory Visit: Payer: Self-pay

## 2023-07-26 DIAGNOSIS — Y906 Blood alcohol level of 120-199 mg/100 ml: Secondary | ICD-10-CM | POA: Insufficient documentation

## 2023-07-26 DIAGNOSIS — R45850 Homicidal ideations: Secondary | ICD-10-CM | POA: Insufficient documentation

## 2023-07-26 DIAGNOSIS — I10 Essential (primary) hypertension: Secondary | ICD-10-CM | POA: Diagnosis not present

## 2023-07-26 DIAGNOSIS — F1010 Alcohol abuse, uncomplicated: Secondary | ICD-10-CM | POA: Diagnosis not present

## 2023-07-26 DIAGNOSIS — F259 Schizoaffective disorder, unspecified: Secondary | ICD-10-CM | POA: Insufficient documentation

## 2023-07-26 DIAGNOSIS — F17210 Nicotine dependence, cigarettes, uncomplicated: Secondary | ICD-10-CM | POA: Diagnosis not present

## 2023-07-26 DIAGNOSIS — F1310 Sedative, hypnotic or anxiolytic abuse, uncomplicated: Secondary | ICD-10-CM | POA: Diagnosis not present

## 2023-07-26 DIAGNOSIS — Z79899 Other long term (current) drug therapy: Secondary | ICD-10-CM | POA: Diagnosis not present

## 2023-07-26 LAB — CBC
HCT: 43.3 % (ref 39.0–52.0)
Hemoglobin: 14.5 g/dL (ref 13.0–17.0)
MCH: 31.2 pg (ref 26.0–34.0)
MCHC: 33.5 g/dL (ref 30.0–36.0)
MCV: 93.1 fL (ref 80.0–100.0)
Platelets: 184 10*3/uL (ref 150–400)
RBC: 4.65 MIL/uL (ref 4.22–5.81)
RDW: 14.3 % (ref 11.5–15.5)
WBC: 10.4 10*3/uL (ref 4.0–10.5)
nRBC: 0 % (ref 0.0–0.2)

## 2023-07-26 LAB — RAPID URINE DRUG SCREEN, HOSP PERFORMED
Amphetamines: NOT DETECTED
Barbiturates: NOT DETECTED
Benzodiazepines: POSITIVE — AB
Cocaine: NOT DETECTED
Opiates: NOT DETECTED
Tetrahydrocannabinol: NOT DETECTED

## 2023-07-26 MED ORDER — STERILE WATER FOR INJECTION IJ SOLN
INTRAMUSCULAR | Status: AC
  Filled 2023-07-26: qty 10

## 2023-07-26 MED ORDER — ZIPRASIDONE MESYLATE 20 MG IM SOLR
10.0000 mg | Freq: Once | INTRAMUSCULAR | Status: AC
  Administered 2023-07-26: 10 mg via INTRAMUSCULAR
  Filled 2023-07-26: qty 20
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
## 2023-07-26 NOTE — ED Notes (Signed)
 Pt belongings; keys, phone, phone charger, and clothes placed in belonging bag and placed in locker 1.

## 2023-07-26 NOTE — ED Notes (Signed)
 Pt states I am getting very frustrated and agitated I need medicine, I am freaking out. Pt notified the MD ordered IM medication to assist pt in relaxing, pt requesting the medication and a nicotine  patch.
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
## 2023-07-26 NOTE — ED Notes (Signed)
 Pt provided with hospital bed to lay in.

## 2023-07-27 DIAGNOSIS — F259 Schizoaffective disorder, unspecified: Secondary | ICD-10-CM | POA: Diagnosis not present

## 2023-07-27 LAB — RESP PANEL BY RT-PCR (RSV, FLU A&B, COVID)  RVPGX2
Influenza A by PCR: NEGATIVE
Influenza B by PCR: NEGATIVE
Resp Syncytial Virus by PCR: NEGATIVE
SARS Coronavirus 2 by RT PCR: NEGATIVE

## 2023-07-27 LAB — COMPREHENSIVE METABOLIC PANEL WITH GFR
ALT: 15 U/L (ref 0–44)
AST: 21 U/L (ref 15–41)
Albumin: 4.1 g/dL (ref 3.5–5.0)
Alkaline Phosphatase: 61 U/L (ref 38–126)
Anion gap: 13 (ref 5–15)
BUN: 11 mg/dL (ref 6–20)
CO2: 22 mmol/L (ref 22–32)
Calcium: 8.9 mg/dL (ref 8.9–10.3)
Chloride: 107 mmol/L (ref 98–111)
Creatinine, Ser: 0.96 mg/dL (ref 0.61–1.24)
GFR, Estimated: >60 mL/min (ref >60)
Glucose, Bld: 97 mg/dL (ref 70–99)
Potassium: 3.5 mmol/L (ref 3.5–5.1)
Sodium: 142 mmol/L (ref 135–145)
Total Bilirubin: 0.2 mg/dL (ref 0.0–1.2)
Total Protein: 7.7 g/dL (ref 6.5–8.1)

## 2023-07-27 LAB — ETHANOL: Alcohol, Ethyl (B): 189 mg/dL — ABNORMAL HIGH (ref <15)

## 2023-07-27 MED ORDER — ZIPRASIDONE MESYLATE 20 MG IM SOLR: 20.0000 mg | Freq: Two times a day (BID) | INTRAMUSCULAR | Status: DC | PRN

## 2023-07-27 MED ORDER — QUETIAPINE FUMARATE 100 MG PO TABS: 400.0000 mg | ORAL_TABLET | Freq: Every day | ORAL | Status: DC

## 2023-07-27 MED ORDER — HYDROCHLOROTHIAZIDE 12.5 MG PO TABS
12.5000 mg | ORAL_TABLET | Freq: Every day | ORAL | Status: DC
  Administered 2023-07-27: 12.5 mg via ORAL
  Filled 2023-07-27: qty 1

## 2023-07-27 MED ORDER — PROPRANOLOL HCL ER 60 MG PO CP24
60.0000 mg | ORAL_CAPSULE | Freq: Every day | ORAL | Status: DC
  Filled 2023-07-27: qty 1

## 2023-07-27 MED ORDER — LISINOPRIL-HYDROCHLOROTHIAZIDE 10-12.5 MG PO TABS: 1.0000 | ORAL_TABLET | Freq: Every day | ORAL | Status: DC

## 2023-07-27 MED ORDER — LISINOPRIL 10 MG PO TABS
10.0000 mg | ORAL_TABLET | Freq: Every day | ORAL | Status: DC
  Administered 2023-07-27: 10 mg via ORAL
  Filled 2023-07-27: qty 1

## 2023-07-27 MED ORDER — ALPRAZOLAM 0.5 MG PO TABS
1.0000 mg | ORAL_TABLET | Freq: Two times a day (BID) | ORAL | Status: DC
Start: 2023-07-27 — End: 2023-07-27
  Administered 2023-07-27: 1 mg via ORAL
  Filled 2023-07-27: qty 2

## 2023-07-27 MED ORDER — CLONAZEPAM 0.5 MG PO TABS
0.5000 mg | ORAL_TABLET | Freq: Every day | ORAL | Status: DC | PRN
  Administered 2023-07-27: 0.5 mg via ORAL
  Filled 2023-07-27: qty 1

## 2023-07-27 MED ORDER — VENLAFAXINE HCL ER 37.5 MG PO CP24
75.0000 mg | ORAL_CAPSULE | Freq: Every day | ORAL | Status: DC
  Administered 2023-07-27: 75 mg via ORAL
  Filled 2023-07-27: qty 2

## 2023-07-27 MED ORDER — GABAPENTIN 300 MG PO CAPS
300.0000 mg | ORAL_CAPSULE | Freq: Three times a day (TID) | ORAL | Status: DC
  Administered 2023-07-27 (×2): 300 mg via ORAL
  Filled 2023-07-27 (×2): qty 1

## 2023-07-27 NOTE — Progress Notes (Signed)
 Inpatient Psychiatric Referral  Patient was recommended inpatient per Roise Cleaver, NP . There are no available beds at [facility], per [facility] Bevin Bucks, RN. Patient was referred to the following out of network facilities:  Destination  Service Provider Request Status Address Phone Fax  [facility]  Pending - Request Sent [address] [place], [place] [address] [phone]  [facility]  Pending - Request Sent [address] [place] [address] [phone]  [facility]  Pending - Request Sent [address] [place] [address] [phone]  [facility]  Pending - Request Sent [address] [place] Dice [place] [address] [phone]  [facility]  Pending - Request Sent [address], [place] [address] [phone]  [facility]  Pending - Request Sent [address] Sharren Decree [place] [phone]  [facility]  Pending - Request Sent [address] [place] [address] [phone]    Situation ongoing, CSW to continue following and update chart as more information becomes available.   Albertus Alt MSW, LCSWA 07/27/2023  1:45PM

## 2023-07-27 NOTE — ED Notes (Signed)
 IVC paperwork faxed to [phone].

## 2023-07-27 NOTE — Progress Notes (Signed)
 Pt has been accepted to [facility] 07/27/2023 . Bed assignment: Main   Pt meets inpatient criteria per Roise Cleaver, NP   Attending Physician will be Edris Gowers, DO  Report can be called to: [phone]  Pt can arrive ASAP  Care Team Notified: Roise Cleaver, NP, Agnes Alderman, RN

## 2023-07-27 NOTE — Consult Note (Signed)
 [facility] Psychiatric Consult Initial  Patient Name: .[name]  MRN: [id number]  DOB: 10-15-1980  Consult Order details:  Orders (From admission, onward)     Start     Ordered   07/27/23 0041  CONSULT TO CALL ACT TEAM       Ordering Provider: Edson Graces, MD  Provider:  (Not yet assigned)  Question:  Reason for Consult?  Answer:  Psych consult   07/27/23 0040             Mode of Visit: Tele-visit Virtual Statement:TELE PSYCHIATRY ATTESTATION & CONSENT As the provider for this telehealth consult, I attest that I verified the patient's identity using two separate identifiers, introduced myself to the patient, provided my credentials, disclosed my location, and performed this encounter via a HIPAA-compliant, real-time, face-to-face, two-way, interactive audio and video platform and with the full consent and agreement of the patient (or guardian as applicable.) Patient physical location: [facility]. Telehealth provider physical location: home office in state of [place].   Video start time: 1300 Video end time: 1315    Psychiatry Consult Evaluation  Service Date: July 27, 2023 LOS:  LOS: 0 days  Chief Complaint HI  Primary Psychiatric Diagnoses  Schizoaffective disorder 2.   3.    Assessment  Alexander AMIRAULT is a 43 y.o. male admitted: Presented to the [facility] 07/26/2023 10:03 PM for worsening paranoia, HI. He carries the psychiatric diagnoses of schizoaffective disorder and has a past medical history of  spinal fusion and chronic pain.   His current presentation is most consistent with schizoaffective disorder and medication noncompliance. He meets criteria for IP based on paranoia.  Current outpatient psychotropic medications include seroquel , effexor, xanax , gabapentin  and historically he has had a positive response to these medications. He was not compliant with medications prior to admission as evidenced by patient report. On initial examination, patient is pleasant/cooperative, but  continues to endorse paranoia, insomnia, and is requesting psychiatric help. Please see plan below for detailed recommendations.   Diagnoses:  Active Hospital problems: Principal Problem:   Schizoaffective disorder (HCC)    Plan   ## Psychiatric Medication Recommendations:  I spoke with Heidi Llamas at [facility] to verify patient current home medications which include  - Xanax  XR 2 mg daily - Gabapentin  300 mg TID - Lisinopril  10 mg daily -Hydrochlorothiazide  12.5 mg daily - Propranolol ER 60 mg at bedtime - Seroquel  400 mg at bedtime - Effexor-XR 75 mg daily  Will continue these home medications  ## Medical Decision Making Capacity: Pt has capacity   ## Disposition:-- We recommend inpatient psychiatric hospitalization after medical hospitalization. Patient has been involuntarily committed on 07/26/23.   ## Behavioral / Environmental: - No specific recommendations at this time.     ## Safety and Observation Level:  - Based on my clinical evaluation, I estimate the patient to be at low risk of self harm in the current setting. - At this time, we recommend  routine. This decision is based on my review of the chart including patient's history and current presentation, interview of the patient, mental status examination, and consideration of suicide risk including evaluating suicidal ideation, plan, intent, suicidal or self-harm behaviors, risk factors, and protective factors. This judgment is based on our ability to directly address suicide risk, implement suicide prevention strategies, and develop a safety plan while the patient is in the clinical setting. Please contact our team if there is a concern that risk level has changed.  CSSR Risk Category:C-SSRS  RISK CATEGORY: No Risk  Suicide Risk Assessment: Patient has following modifiable risk factors for suicide: under treated depression , social isolation, recklessness, and medication noncompliance, which we are addressing by  inpatient admission. Patient has following non-modifiable or demographic risk factors for suicide: male gender and psychiatric hospitalization Patient has the following protective factors against suicide: Access to outpatient mental health care, Supportive family, and Cultural, spiritual, or religious beliefs that discourage suicide  Thank you for this consult request. Recommendations have been communicated to the primary team.  We will recommend inpatient treatment at this time.   Roise Cleaver, NP       History of Present Illness  Relevant Aspects of Hospital ED Course:  Admitted on 07/26/2023 for Homicidal ideation with specific targets, specific plan. Patient reportedly was a bit agitated prior to my evaluation of the patient, was given some Geodon  by prior provider. On my exam he is resting comfortably, wakes easily, a bit drowsy from the Geodon . IVC initiated given that he was agitated and threatening to leave earlier and he sounds like the risk of harm to others. Consulting TTS after medical clearance.  Patient Report:  Patient seen for psychiatric evaluation.  Patient stated he has a psychiatrist however he is not consistent with his medication.  He often forgets to take his medications, he thinks he is taking his medications maybe 3-4 times a week.  Patient states he is extremely paranoid, he often feels like somebody is outside of his house trying to hurt him.  He finds it very difficult to sleep especially with his paranoia.  He believes he gets irritable quickly and has anger issues.  He does feel like he chronically has auditory hallucinations.  He does report occasional visual hallucinations.  Often his auditory hallucinations make him believe someone is there at the house trying to harm him.  He finds it difficult to leave his house at this time.  He denies suicidal ideations.  He continues to endorse passive homicidal ideations.  He does feel safe currently in the  hospital.  Patient is wanting to restart his medications, and stabilize prior to discharge home.  He is seeking psychiatric treatment.  Patient is currently under IVC, will recommend inpatient psychiatric treatment for further stabilization.  Home indications have been restarted.   Review of Systems  Psychiatric/Behavioral:  Positive for hallucinations and substance abuse. The patient has insomnia.        Paranoia     Psychiatric and Social History  Psychiatric History:  Information collected from patient, chart  Prev Dx/Sx: schizoaffective disorder Current Psych Provider: yes Home Meds (current): see mar Previous Med Trials: unknown Therapy: denies  Prior Psych Hospitalization: yes, 1  year ago [facility]   Access to weapons/lethal means: denies but states he could find one if he neededd it   Substance History Alcohol : yes, occasional beers a few times a week  Tobacco: occasional Illicit drugs: THC   Exam Findings  Physical Exam:  Vital Signs:  Temp:  [98.1 F (36.7 C)-98.2 F (36.8 C)] 98.1 F (36.7 C) (06/16 1022) Pulse Rate:  [90-105] 90 (06/16 1022) Resp:  [18-20] 18 (06/16 1022) BP: (138-151)/(101-102) 151/101 (06/16 1022) SpO2:  [96 %-98 %] 98 % (06/16 1022) Weight:  [104.3 kg] 104.3 kg (06/15 2202) Blood pressure (!) 151/101, pulse 90, temperature 98.1 F (36.7 C), temperature source Oral, resp. rate 18, height 5' 11 (1.803 m), weight 104.3 kg, SpO2 98%. Body mass index is 32.08 kg/m.  Physical Exam Vitals  and nursing note reviewed.   Neurological:     Mental Status: He is alert and oriented to person, place, and time.     Mental Status Exam: General Appearance: Fairly Groomed  Orientation:  Full (Time, Place, and Person)  Memory:  Immediate;   Fair Recent;   Fair  Concentration:  Concentration: Fair  Recall:  Fair  Attention  Fair  Eye Contact:  Fair  Speech:  Clear and Coherent  Language:  Good  Volume:  Normal  Mood: anxious  Affect:  Congruent  and Flat  Thought Process:  Coherent  Thought Content:  Illogical and Paranoid Ideation  Suicidal Thoughts:  No  Homicidal Thoughts:  Yes.  without intent/plan  Judgement:  Impaired  Insight:  Fair  Psychomotor Activity:  Normal  Akathisia:  No  Fund of Knowledge:  Fair      Assets:  Desire for Improvement Housing Physical Health  Cognition:  WNL  ADL's:  Intact  AIMS (if indicated):        Other History   These have been pulled in through the EMR, reviewed, and updated if appropriate.  Family History:  The patient's family history includes Stroke in his mother.  Medical History: Past Medical History:  Diagnosis Date  . Anxiety   . Back pain   . Depression   . Hypertension   . Schizoaffective disorder [facility])     Surgical History: Past Surgical History:  Procedure Laterality Date  . BACK SURGERY       Medications:   Current Facility-Administered Medications:  .  ALPRAZolam  (XANAX  XR) 24 hr tablet 2 mg, 2 mg, Oral, Daily, Ford Ide, Lucious Sabina, NP .  gabapentin  (NEURONTIN ) capsule 300 mg, 300 mg, Oral, TID, Rosealee Concha, MD, 300 mg at 07/27/23 1020 .  lisinopril  (ZESTRIL ) tablet 10 mg, 10 mg, Oral, Daily, 10 mg at 07/27/23 1019 **AND** hydrochlorothiazide  (HYDRODIURIL ) tablet 12.5 mg, 12.5 mg, Oral, Daily, Rosealee Concha, MD, 12.5 mg at 07/27/23 1020 .  propranolol ER (INDERAL LA) 24 hr capsule 60 mg, 60 mg, Oral, QHS, Rosealee Concha, MD .  QUEtiapine  (SEROQUEL ) tablet 400 mg, 400 mg, Oral, QHS, Rosealee Concha, MD .  venlafaxine XR (EFFEXOR-XR) 24 hr capsule 75 mg, 75 mg, Oral, Daily, Rosealee Concha, MD, 75 mg at 07/27/23 1019 .  ziprasidone  (GEODON ) injection 20 mg, 20 mg, Intramuscular, Q12H PRN, Rosealee Concha, MD  Current Outpatient Medications:  .  gabapentin  (NEURONTIN ) 300 MG capsule, Take 1 capsule (300 mg total) by mouth 3 (three) times daily., Disp: 90 capsule, Rfl: 0 .  clonazePAM  (KLONOPIN ) 1 MG tablet, Take 0.5-1 mg by mouth daily as needed., Disp: , Rfl:   .  COBENFY 100-20 MG CAPS, Take 1 capsule by mouth 2 (two) times daily., Disp: , Rfl:  .  lisinopril -hydrochlorothiazide  (ZESTORETIC ) 10-12.5 MG tablet, Take 1 tablet by mouth daily., Disp: 90 tablet, Rfl: 3 .  metoprolol  succinate (TOPROL -XL) 50 MG 24 hr tablet, Take 1 tablet (50 mg total) by mouth daily. Take with or immediately following a meal., Disp: 90 tablet, Rfl: 3 .  propranolol ER (INDERAL LA) 60 MG 24 hr capsule, Take 60 mg by mouth at bedtime., Disp: , Rfl:  .  QUEtiapine  (SEROQUEL ) 200 MG tablet, Take 1 tablet (200 mg total) by mouth at bedtime. (Patient taking differently: Take 400 mg by mouth at bedtime.), Disp: 30 tablet, Rfl: 0 .  venlafaxine XR (EFFEXOR-XR) 75 MG 24 hr capsule, Take 75 mg by mouth daily., Disp: , Rfl:   Allergies: No  Known Allergies  Roise Cleaver, NP

## 2023-07-27 NOTE — ED Provider Notes (Signed)
 Pt has been accepted to [facility] 07/27/2023 . Bed assignment: Main Pt meets inpatient criteria per Roise Cleaver, NP Attending Physician will be Edris Gowers, DO Report can be called to: [phone] Pt can arrive ASAP Care Team Notified: Roise Cleaver, NP, Agnes Alderman, RN   EMTALA completed by myself.   Owen Blowers P, DO 07/27/23 1523

## 2023-07-27 NOTE — BH Assessment (Signed)
 Attempted to complete TTS assessment but patient was too sleepy from medication to participate.  Per RN patient received medication at 2230. Advised RN to reach out if patient awakes and TTS will complete the assessment.

## 2023-07-27 NOTE — ED Notes (Signed)
 Pt received lunch tray

## 2023-07-27 NOTE — ED Provider Notes (Signed)
 Emergency Medicine Observation Re-evaluation Note  KOLYN ROZARIO is a 43 y.o. male, seen on rounds today.  Pt initially presented to the ED for complaints of Homicidal Currently, the patient is resting, asleep on his stretcher.  Physical Exam  BP (!) 138/102   Pulse (!) 105   Temp 98.2 F (36.8 C) (Oral)   Resp 20   Ht 5' 11 (1.803 m)   Wt 104.3 kg   SpO2 96%   BMI 32.08 kg/m  Physical Exam General: NAD Cardiac: well perfused Lungs: even and unlabored Psych: no agitation, resting  ED Course / MDM  EKG:   I have reviewed the labs performed to date as well as medications administered while in observation.  Recent changes in the last 24 hours include pt had been agitated yesterday evening, received IM Geodon , TTS consult pending this AM.  Plan  Current plan is for TTS for disposition.    Rosealee Concha, MD 07/27/23 [phone]

## 2023-09-04 ENCOUNTER — Other Ambulatory Visit: Payer: Self-pay

## 2023-09-04 ENCOUNTER — Emergency Department (HOSPITAL_COMMUNITY)

## 2023-09-04 ENCOUNTER — Emergency Department (HOSPITAL_COMMUNITY)
Admission: EM | Admit: 2023-09-04 | Discharge: 2023-09-04 | Discharge status: Against Medical Advice | Attending: Emergency Medicine | Admitting: Emergency Medicine

## 2023-09-04 DIAGNOSIS — Y92410 Unspecified street and highway as the place of occurrence of the external cause: Secondary | ICD-10-CM | POA: Diagnosis not present

## 2023-09-04 DIAGNOSIS — F17210 Nicotine dependence, cigarettes, uncomplicated: Secondary | ICD-10-CM | POA: Diagnosis not present

## 2023-09-04 DIAGNOSIS — R791 Abnormal coagulation profile: Secondary | ICD-10-CM | POA: Diagnosis not present

## 2023-09-04 DIAGNOSIS — S0120XA Unspecified open wound of nose, initial encounter: Secondary | ICD-10-CM | POA: Insufficient documentation

## 2023-09-04 DIAGNOSIS — S3991XA Unspecified injury of abdomen, initial encounter: Secondary | ICD-10-CM | POA: Insufficient documentation

## 2023-09-04 DIAGNOSIS — Z79899 Other long term (current) drug therapy: Secondary | ICD-10-CM | POA: Diagnosis not present

## 2023-09-04 DIAGNOSIS — S0180XA Unspecified open wound of other part of head, initial encounter: Secondary | ICD-10-CM | POA: Diagnosis not present

## 2023-09-04 DIAGNOSIS — I10 Essential (primary) hypertension: Secondary | ICD-10-CM | POA: Insufficient documentation

## 2023-09-04 DIAGNOSIS — Y906 Blood alcohol level of 120-199 mg/100 ml: Secondary | ICD-10-CM | POA: Diagnosis not present

## 2023-09-04 DIAGNOSIS — S299XXA Unspecified injury of thorax, initial encounter: Secondary | ICD-10-CM | POA: Diagnosis not present

## 2023-09-04 DIAGNOSIS — S0990XA Unspecified injury of head, initial encounter: Secondary | ICD-10-CM | POA: Diagnosis present

## 2023-09-04 LAB — CBC
HCT: 41.1 % (ref 39.0–52.0)
Hemoglobin: 14.2 g/dL (ref 13.0–17.0)
MCH: 32.9 pg (ref 26.0–34.0)
MCHC: 34.5 g/dL (ref 30.0–36.0)
MCV: 95.1 fL (ref 80.0–100.0)
Platelets: 175 K/uL (ref 150–400)
RBC: 4.32 MIL/uL (ref 4.22–5.81)
RDW: 14.6 % (ref 11.5–15.5)
WBC: 11.7 K/uL — ABNORMAL HIGH (ref 4.0–10.5)
nRBC: 0 % (ref 0.0–0.2)

## 2023-09-04 LAB — URINALYSIS, ROUTINE W REFLEX MICROSCOPIC
Bilirubin Urine: NEGATIVE
Glucose, UA: NEGATIVE mg/dL
Hgb urine dipstick: NEGATIVE
Ketones, ur: NEGATIVE mg/dL
Leukocytes,Ua: NEGATIVE
Nitrite: NEGATIVE
Protein, ur: NEGATIVE mg/dL
Specific Gravity, Urine: 1.004 — ABNORMAL LOW (ref 1.005–1.030)
pH: 6 (ref 5.0–8.0)

## 2023-09-04 LAB — COMPREHENSIVE METABOLIC PANEL WITH GFR
ALT: 15 U/L (ref 0–44)
AST: 28 U/L (ref 15–41)
Albumin: 4.1 g/dL (ref 3.5–5.0)
Alkaline Phosphatase: 50 U/L (ref 38–126)
Anion gap: 10 (ref 5–15)
BUN: 10 mg/dL (ref 6–20)
CO2: 23 mmol/L (ref 22–32)
Calcium: 8.7 mg/dL — ABNORMAL LOW (ref 8.9–10.3)
Chloride: 104 mmol/L (ref 98–111)
Creatinine, Ser: 1.06 mg/dL (ref 0.61–1.24)
GFR, Estimated: >60 mL/min (ref >60)
Glucose, Bld: 92 mg/dL (ref 70–99)
Potassium: 4 mmol/L (ref 3.5–5.1)
Sodium: 137 mmol/L (ref 135–145)
Total Bilirubin: 0.4 mg/dL (ref 0.0–1.2)
Total Protein: 7.7 g/dL (ref 6.5–8.1)

## 2023-09-04 LAB — LACTIC ACID, PLASMA: Lactic Acid, Venous: 2.3 mmol/L (ref 0.5–1.9)

## 2023-09-04 LAB — PROTIME-INR
INR: 1.1 (ref 0.8–1.2)
Prothrombin Time: 14.6 s (ref 11.4–15.2)

## 2023-09-04 LAB — SAMPLE TO BLOOD BANK

## 2023-09-04 LAB — ETHANOL: Alcohol, Ethyl (B): 196 mg/dL — ABNORMAL HIGH (ref <15)

## 2023-09-04 MED ORDER — SODIUM CHLORIDE 0.9 % IV BOLUS
2000.0000 mL | Freq: Once | INTRAVENOUS | Status: AC
  Administered 2023-09-04: 2000 mL via INTRAVENOUS

## 2023-09-04 MED ORDER — IOHEXOL 300 MG/ML  SOLN
100.0000 mL | Freq: Once | INTRAMUSCULAR | Status: AC | PRN
  Administered 2023-09-04: 100 mL via INTRAVENOUS

## 2023-09-04 MED ORDER — TETANUS-DIPHTH-ACELL PERTUSSIS 5-2.5-18.5 LF-MCG/0.5 IM SUSY
0.5000 mL | PREFILLED_SYRINGE | Freq: Once | INTRAMUSCULAR | Status: DC
  Filled 2023-09-04: qty 0.5

## 2023-09-04 MED ORDER — HYDROMORPHONE HCL 1 MG/ML IJ SOLN
0.5000 mg | Freq: Once | INTRAMUSCULAR | Status: AC
  Administered 2023-09-04: 0.5 mg via INTRAVENOUS
  Filled 2023-09-04: qty 0.5

## 2023-09-04 MED ORDER — LORAZEPAM 2 MG/ML IJ SOLN
1.0000 mg | Freq: Once | INTRAMUSCULAR | Status: AC
  Administered 2023-09-04: 1 mg via INTRAVENOUS
  Filled 2023-09-04: qty 1
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
## 2023-09-04 NOTE — ED Notes (Signed)
 Pt stated that he wanted to leave after he took his own IV out, Nurse explained that he was not up for dc but he could sign AMA. Pt stated that he would stay if he could get more pain meds and nurse explained that he had already been given pain meds. Pt then stated that he wanted to leave and he would go find meds on the street. Nurse and Merchandiser, retail as well as Dr and NT all witnessed pt get up and ambulate without issues. Pt then proceeded to leave department.
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
## 2023-09-05 ENCOUNTER — Emergency Department (HOSPITAL_COMMUNITY)
Admission: EM | Admit: 2023-09-05 | Discharge: 2023-09-05 | Disposition: A | Discharge status: Home / Self Care | Attending: Emergency Medicine | Admitting: Emergency Medicine

## 2023-09-05 ENCOUNTER — Encounter (HOSPITAL_COMMUNITY): Payer: Self-pay

## 2023-09-05 DIAGNOSIS — M545 Low back pain, unspecified: Secondary | ICD-10-CM | POA: Diagnosis present

## 2023-09-05 DIAGNOSIS — S30810D Abrasion of lower back and pelvis, subsequent encounter: Secondary | ICD-10-CM | POA: Diagnosis not present

## 2023-09-05 DIAGNOSIS — S0031XD Abrasion of nose, subsequent encounter: Secondary | ICD-10-CM | POA: Diagnosis not present

## 2023-09-05 DIAGNOSIS — I10 Essential (primary) hypertension: Secondary | ICD-10-CM | POA: Diagnosis not present

## 2023-09-05 DIAGNOSIS — Z79899 Other long term (current) drug therapy: Secondary | ICD-10-CM | POA: Diagnosis not present

## 2023-09-05 DIAGNOSIS — R42 Dizziness and giddiness: Secondary | ICD-10-CM | POA: Diagnosis not present

## 2023-09-05 MED ORDER — KETOROLAC TROMETHAMINE 15 MG/ML IJ SOLN
15.0000 mg | Freq: Once | INTRAMUSCULAR | Status: AC
  Administered 2023-09-05: 15 mg via INTRAMUSCULAR
  Filled 2023-09-05: qty 1
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
## 2023-09-05 NOTE — ED Notes (Signed)
 Cleaned pt wounds on right buttock and applied petroleum patches then covered with abdominal dressing and taped.
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
